# Patient Record
Sex: Female | Born: 1965
Health system: Southern US, Community
[De-identification: ages and names within clinical notes are randomized; demographics above are authoritative.]

## PROBLEM LIST (undated history)

## (undated) DIAGNOSIS — E559 Vitamin D deficiency, unspecified: Secondary | ICD-10-CM

## (undated) DIAGNOSIS — Z803 Family history of malignant neoplasm of breast: Secondary | ICD-10-CM

## (undated) DIAGNOSIS — F3289 Other specified depressive episodes: Secondary | ICD-10-CM

## (undated) DIAGNOSIS — F411 Generalized anxiety disorder: Secondary | ICD-10-CM

## (undated) DIAGNOSIS — F329 Major depressive disorder, single episode, unspecified: Secondary | ICD-10-CM

## (undated) DIAGNOSIS — D219 Benign neoplasm of connective and other soft tissue, unspecified: Secondary | ICD-10-CM

## (undated) DIAGNOSIS — N926 Irregular menstruation, unspecified: Secondary | ICD-10-CM

## (undated) DIAGNOSIS — K589 Irritable bowel syndrome without diarrhea: Secondary | ICD-10-CM

## (undated) DIAGNOSIS — I10 Essential (primary) hypertension: Secondary | ICD-10-CM

## (undated) HISTORY — DX: Irregular menstruation, unspecified: N92.6

## (undated) HISTORY — PX: OTHER SURGICAL HISTORY: SHX169

## (undated) HISTORY — PX: DILATION AND CURETTAGE OF UTERUS: SHX78

## (undated) HISTORY — DX: Benign neoplasm of connective and other soft tissue, unspecified: D21.9

## (undated) HISTORY — DX: Vitamin D deficiency, unspecified: E55.9

## (undated) HISTORY — DX: Essential (primary) hypertension: I10

## (undated) HISTORY — DX: Generalized anxiety disorder: F41.1

## (undated) HISTORY — DX: Other specified depressive episodes: F32.89

## (undated) HISTORY — DX: Family history of malignant neoplasm of breast: Z80.3

## (undated) HISTORY — DX: Irritable bowel syndrome, unspecified: K58.9

## (undated) HISTORY — DX: Major depressive disorder, single episode, unspecified: F32.9

---

## 1996-02-13 HISTORY — PX: WISDOM TOOTH EXTRACTION: SHX21

## 1997-05-26 ENCOUNTER — Other Ambulatory Visit: Admission: RE | Admit: 1997-05-26 | Discharge: 1997-05-26 | Payer: Self-pay | Admitting: *Deleted

## 1999-04-05 ENCOUNTER — Other Ambulatory Visit: Admission: RE | Admit: 1999-04-05 | Discharge: 1999-04-05 | Payer: Self-pay | Admitting: *Deleted

## 1999-08-16 ENCOUNTER — Emergency Department (HOSPITAL_COMMUNITY): Admission: EM | Admit: 1999-08-16 | Discharge: 1999-08-16 | Payer: Self-pay | Admitting: Emergency Medicine

## 2004-04-10 ENCOUNTER — Other Ambulatory Visit: Admission: RE | Admit: 2004-04-10 | Discharge: 2004-04-10 | Payer: Self-pay | Admitting: *Deleted

## 2004-04-19 ENCOUNTER — Ambulatory Visit (HOSPITAL_COMMUNITY): Admission: RE | Admit: 2004-04-19 | Discharge: 2004-04-19 | Payer: Self-pay | Admitting: *Deleted

## 2004-04-27 ENCOUNTER — Encounter: Admission: RE | Admit: 2004-04-27 | Discharge: 2004-04-27 | Payer: Self-pay | Admitting: *Deleted

## 2005-02-12 DIAGNOSIS — N926 Irregular menstruation, unspecified: Secondary | ICD-10-CM

## 2005-02-12 HISTORY — DX: Irregular menstruation, unspecified: N92.6

## 2006-01-08 ENCOUNTER — Emergency Department: Payer: Self-pay | Admitting: Emergency Medicine

## 2006-08-06 ENCOUNTER — Emergency Department: Payer: Self-pay | Admitting: Emergency Medicine

## 2008-08-12 HISTORY — PX: ABDOMINAL HYSTERECTOMY: SHX81

## 2008-08-25 ENCOUNTER — Ambulatory Visit: Payer: Self-pay | Admitting: Obstetrics & Gynecology

## 2008-08-31 ENCOUNTER — Ambulatory Visit: Payer: Self-pay | Admitting: Obstetrics & Gynecology

## 2008-09-04 ENCOUNTER — Emergency Department: Payer: Self-pay | Admitting: Emergency Medicine

## 2009-04-26 ENCOUNTER — Encounter: Payer: Self-pay | Admitting: Family Medicine

## 2009-05-20 ENCOUNTER — Encounter (INDEPENDENT_AMBULATORY_CARE_PROVIDER_SITE_OTHER): Payer: Self-pay | Admitting: *Deleted

## 2009-06-15 DIAGNOSIS — D649 Anemia, unspecified: Secondary | ICD-10-CM | POA: Insufficient documentation

## 2009-06-15 DIAGNOSIS — G43909 Migraine, unspecified, not intractable, without status migrainosus: Secondary | ICD-10-CM | POA: Insufficient documentation

## 2009-06-15 HISTORY — DX: Anemia, unspecified: D64.9

## 2009-06-17 ENCOUNTER — Ambulatory Visit: Payer: Self-pay | Admitting: Gastroenterology

## 2009-06-17 ENCOUNTER — Encounter (INDEPENDENT_AMBULATORY_CARE_PROVIDER_SITE_OTHER): Payer: Self-pay | Admitting: *Deleted

## 2009-06-17 DIAGNOSIS — K5900 Constipation, unspecified: Secondary | ICD-10-CM | POA: Insufficient documentation

## 2009-06-17 DIAGNOSIS — F411 Generalized anxiety disorder: Secondary | ICD-10-CM | POA: Insufficient documentation

## 2009-06-17 DIAGNOSIS — F419 Anxiety disorder, unspecified: Secondary | ICD-10-CM | POA: Insufficient documentation

## 2009-06-17 DIAGNOSIS — R519 Headache, unspecified: Secondary | ICD-10-CM | POA: Insufficient documentation

## 2009-06-17 DIAGNOSIS — K589 Irritable bowel syndrome without diarrhea: Secondary | ICD-10-CM | POA: Insufficient documentation

## 2009-06-17 DIAGNOSIS — R51 Headache: Secondary | ICD-10-CM | POA: Insufficient documentation

## 2009-06-17 DIAGNOSIS — F339 Major depressive disorder, recurrent, unspecified: Secondary | ICD-10-CM | POA: Insufficient documentation

## 2009-06-17 DIAGNOSIS — I10 Essential (primary) hypertension: Secondary | ICD-10-CM | POA: Insufficient documentation

## 2009-06-17 LAB — CONVERTED CEMR LAB
Sed Rate: 10 mm/hr (ref 0–22)
T4, Total: 6.1 ug/dL (ref 5.0–12.5)
Vitamin B-12: 338 pg/mL (ref 211–911)

## 2009-06-24 ENCOUNTER — Ambulatory Visit: Payer: Self-pay | Admitting: Gastroenterology

## 2009-07-13 ENCOUNTER — Ambulatory Visit: Payer: Self-pay | Admitting: Family Medicine

## 2009-07-13 DIAGNOSIS — H6120 Impacted cerumen, unspecified ear: Secondary | ICD-10-CM | POA: Insufficient documentation

## 2009-07-13 DIAGNOSIS — Z8659 Personal history of other mental and behavioral disorders: Secondary | ICD-10-CM | POA: Insufficient documentation

## 2009-07-20 ENCOUNTER — Telehealth (INDEPENDENT_AMBULATORY_CARE_PROVIDER_SITE_OTHER): Payer: Self-pay | Admitting: *Deleted

## 2010-03-14 NOTE — Letter (Signed)
Summary: [facility] Instructions  [facility]  [address] [place], [place] [address]   Phone: [phone]  Fax: [phone]       Kaitlyn Solomon    11/04/1965    MRN: [id number]        Procedure Day /Date: Friday, 06/24/09     Arrival Time: 2:30      Procedure Time: 3:30     Location of Procedure:                    Juliann Pares  [facility] (4th Floor)                        PREPARATION FOR COLONOSCOPY WITH MOVIPREP   Starting 5 days prior to your procedure 06/19/09 do not eat nuts, seeds, popcorn, corn, beans, peas,  salads, or any raw vegetables.  Do not take any fiber supplements (e.g. Metamucil, Citrucel, and Benefiber).  THE DAY BEFORE YOUR PROCEDURE         DATE: 06/23/09   DAY: Thursday  1.  Drink clear liquids the entire day-NO SOLID FOOD  2.  Do not drink anything colored red or purple.  Avoid juices with pulp.  No orange juice.  3.  Drink at least 64 oz. (8 glasses) of fluid/clear liquids during the day to prevent dehydration and help the prep work efficiently.  CLEAR LIQUIDS INCLUDE: Water Jello Ice Popsicles Tea (sugar ok, no milk/cream) Powdered fruit flavored drinks Coffee (sugar ok, no milk/cream) Gatorade Juice: apple, white grape, white cranberry  Lemonade Clear bullion, consomm, broth Carbonated beverages (any kind) Strained chicken noodle soup Hard Candy                             4.  In the morning, mix first dose of MoviPrep solution:    Empty 1 Pouch A and 1 Pouch B into the disposable container    Add lukewarm drinking water to the top line of the container. Mix to dissolve    Refrigerate (mixed solution should be used within 24 hrs)  5.  Begin drinking the prep at 5:00 p.m. The MoviPrep container is divided by 4 marks.   Every 15 minutes drink the solution down to the next mark (approximately 8 oz) until the full liter is complete.   6.  Follow completed prep with 16 oz of clear liquid of your choice (Nothing red  or purple).  Continue to drink clear liquids until bedtime.  7.  Before going to bed, mix second dose of MoviPrep solution:    Empty 1 Pouch A and 1 Pouch B into the disposable container    Add lukewarm drinking water to the top line of the container. Mix to dissolve    Refrigerate  THE DAY OF YOUR PROCEDURE      DATE: 06/24/09   DAY: Friday  Beginning at 10:30a.m. (5 hours before procedure):         1. Every 15 minutes, drink the solution down to the next mark (approx 8 oz) until the full liter is complete.  2. Follow completed prep with 16 oz. of clear liquid of your choice.    3. You may drink clear liquids until 1:30 (2 HOURS BEFORE PROCEDURE).   MEDICATION INSTRUCTIONS  Unless otherwise instructed, you should take regular prescription medications with a small sip of water   as early as possible the morning of  your procedure.  .                  OTHER INSTRUCTIONS  You will need a responsible adult at least 47 years of age to accompany you and drive you home.   This person must remain in the waiting room during your procedure.  Wear loose fitting clothing that is easily removed.  Leave jewelry and other valuables at home.  However, you may wish to bring a book to read or  an iPod/MP3 player to listen to music as you wait for your procedure to start.  Remove all body piercing jewelry and leave at home.  Total time from sign-in until discharge is approximately 2-3 hours.  You should go home directly after your procedure and rest.  You can resume normal activities the  day after your procedure.  The day of your procedure you should not:   Drive   Make legal decisions   Operate machinery   Drink alcohol   Return to work  You will receive specific instructions about eating, activities and medications before you leave.    The above instructions have been reviewed and explained to me by   _______________________    I fully understand and can  verbalize these instructions _____________________________ Date _________

## 2010-03-14 NOTE — Procedures (Signed)
Summary: Colonoscopy  Patient: Kaitlyn Solomon Note: All result statuses are Final unless otherwise noted.  Tests: (1) Colonoscopy (COL)   COL Colonoscopy           DONE     [facility]     [address]     [place], [place]  [address]           COLONOSCOPY PROCEDURE REPORT           PATIENT:  [name], [name]  MR#:  [id number]     BIRTHDATE:  08/26/1965, 43 yrs. old  GENDER:  female     ENDOSCOPIST:  Vania Rea. Jarold Motto, MD, [facility]     REF. BY:     PROCEDURE DATE:  06/24/2009     PROCEDURE:  Average-risk screening colonoscopy     G0121     ASA CLASS:  Class II     INDICATIONS:  FOBT positive stool, Routine Risk Screening     MEDICATIONS:   Fentanyl 100 mcg IV, Versed 10 mg IV           DESCRIPTION OF PROCEDURE:   After the risks benefits and     alternatives of the procedure were thoroughly explained, informed     consent was obtained.  Digital rectal exam was performed and     revealed no abnormalities.   The LB [id number] endoscope     was introduced through the anus and advanced to the cecum, which     was identified by both the appendix and ileocecal valve, without     limitations.  The quality of the prep was excellent, using     MoviPrep.  The instrument was then slowly withdrawn as the colon     was fully examined.     <<PROCEDUREIMAGES>>           FINDINGS:  Severe diverticulosis was found throughout the colon.     No polyps or cancers were seen.  no active bleeding or blood in c.     Retroflexed views in the rectum revealed no abnormalities.    The     scope was then withdrawn from the patient and the procedure     completed.           COMPLICATIONS:  None     ENDOSCOPIC IMPRESSION:     1) Severe diverticulosis throughout the colon     2) No polyps or cancers     3) No active bleeding or blood in c     1.CHRONIC FUNCTIONAL CONSTIPATION.SEVERE PANCOLONIC     DIVERTICULOSIS SUGGESTS UNDERLYING MOTILITY DISORDER.     2.FALSE ++ STOOL GUAIAC.  RECOMMENDATIONS:     1) Continue current colorectal screening recommendations for     "routine risk" patients with a repeat colonoscopy in 10 years.     2) Continue current medications     3) Titrate to need     REPEAT EXAM:  No           ______________________________     Vania Rea. Jarold Motto, MD, [facility] Graham           CC:  Harold Hedge, MD           n.     Rosalie Doctor:   Vania Rea. Shivank Pinedo at 06/24/2009 03:19 PM           Ahmed Prima, [id number]  Note: An exclamation mark (!) indicates a result that was not dispersed into the flowsheet. Document Creation Date: 06/24/2009 3:20  PM _______________________________________________________________________  (1) Order result status: Final Collection or observation date-time: 06/24/2009 15:11 Requested date-time:  Receipt date-time:  Reported date-time:  Referring Physician:   Ordering Physician: Sheryn Bison [phone]) Specimen Source:  Source: Launa Grill Order Number: [id number] Lab site:   Appended Document: Colonoscopy    Clinical Lists Changes  Observations: Added new observation of COLONNXTDUE: 06/2019 (06/24/2009 16:03)

## 2010-03-14 NOTE — Assessment & Plan Note (Signed)
Summary: NEW PT TO EST/CLE   Vital Signs:  Patient profile:   45 year old female Height:      62 inches Weight:      151.38 pounds BMI:     27.79 Temp:     98.3 degrees F oral Pulse rate:   68 / minute Pulse rhythm:   regular BP sitting:   122 / 80  (left arm) Cuff size:   regular  Vitals Entered By: Linde Gillis CMA Duncan Dull) (July 13, 2009 9:47 AM) CC: new patient   History of Present Illness: 45 yo here to establish care.  1.  Depression/bipolar disorder- diagnosed in 2004 after she had a manic episode.  Was on Effexor, Lamictal, xanax, doses unknown.  Helped greatly for a few years but her husband did not want her on medications.  Now is separated from husband and feels depression is getting much worse.  Tearful all the time, anhedonia, insomina, decreased appetite.  Denies any manic symptoms that she is aware of other than going several nights without sleeping.  No SI or HI.  Under a lot of stress- going to CNA school full time, works at night.  Has a 3 yo daughter.  2.  Cannot hear out of right ear for past several weeks.  No trauma.  No ear pain or congestion.  3.  HTN- OBGYN has prescrived Enalapril/HCTZ 25/12/5 mg daily for years.  No side effects, doing well.  No HA, blurred vision, CP or SOB.  No LE edema.  4.  Diverticulosis- followed by GI.  Colonoscopy last month (see report).  Takes Miralax nightly.  5.  Well woman- UTD on prevention.  Per pt, had fasting labs a few months ago at Dr. Phineas Real office.  s/p hysterectomy for menorrahgia in 08/2008.    Allergies: 1)  ! 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  Alcohol Use - no Daily Caffeine Use Illicit Drug Use - no  Risk Factors: Smoking Status: quit (06/17/2009)  Past Medical History: Current Problems:  DEPRESSION (ICD-311) IRRITABLE BOWEL SYNDROME (ICD-564.1) HYPERTENSION (ICD-401.9) HEADACHE, CHRONIC (ICD-784.0) ANXIETY (ICD-300.00) MIGRAINE HEADACHE (ICD-346.90) ANEMIA (ICD-285.9) bipolar disorder.  Past Surgical History: Hysterectomy 08/2008 D& C surgeries to remove fibriod tumors  Family History: Reviewed history from 06/17/2009 and no changes required. Family History of Breast Cancer: mother (died at 70),  MGM, maternal aunt Family History of Diabetes: MGM, Maternal uncle, younger brother fathers side of the family history is unknown by the patient but he commited suicide at 63 No FH of Colon Cancer:  Social History: Reviewed history from 06/17/2009 and no changes required. 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  Alcohol Use - no Daily Caffeine Use Illicit Drug Use - no  Review of Systems      See HPI General:  Complains of fatigue; denies chills and malaise. Eyes:  Denies blurring. ENT:  Complains of decreased hearing; denies difficulty swallowing, ear discharge, earache, hoarseness, nasal congestion, nosebleeds, postnasal drainage, and ringing in ears. CV:  Denies difficulty breathing at night. Resp:  Denies shortness of breath. GI:  Denies abdominal pain, bloody stools, and change  in bowel habits. GU:  Denies abnormal vaginal bleeding. MS:  Denies joint pain, joint redness, and joint swelling. Derm:  Denies rash. Neuro:  Denies headaches. Psych:  Complains of anxiety, depression, easily angered, easily tearful, and irritability; denies mental problems, panic attacks, sense of great danger, suicidal thoughts/plans, thoughts of violence, unusual visions or sounds, and thoughts /plans of harming others. Endo:  Denies cold intolerance and heat  intolerance. Heme:  Denies abnormal bruising and bleeding. Allergy:  Denies seasonal allergies.  Physical Exam  General:  Well developed, well nourished, no acute distress.healthy appearing.   Ears:  right ear: impacted cerumen- irrigated using water, currette to remove cerumen left ear normal Mouth:  Oral mucosa and oropharynx without lesions or exudates.  Teeth in good repair. Psych:  Oriented X3, memory intact for recent and remote, good eye contact, and depressed affect., tearful.     Impression & Recommendations:  Problem # 1:  DEPRESSION (ICD-311) Assessment Deteriorated Time spent with patient 45 minutes, more than 50% of this time was spent counseling patient on depression/bipolar disorder.  Agreed to restart Effexor, but hold off on Lamictal.  Refer to psychiatry for further management.  Prescribe Alprazolam (short term only) until Effexor starts to take affect.  Will likely need to titrate up dose.  Follow up with me in one month. Her updated medication list for this problem includes:    Alprazolam 0.25 Mg Tabs (Alprazolam) .Marland Kitchen... 1 tab by mouth three times a day as needed anxiety    Effexor Xr 75 Mg Cp24 (Venlafaxine hcl) ..... Once daily  Orders: Psychiatric Referral (Psych)  Problem # 2:  CERUMEN IMPACTION, RIGHT (ICD-380.4) Assessment: New  Removed Cerumen with water and currette.  Hearing vastly improved once removed.  Orders: Cerumen Impaction Removal (27253)  Problem # 3:  HYPERTENSION (ICD-401.9) Assessment: Unchanged Stable.  Refilled current meds.   Her updated medication list for this problem includes:    Vaseretic 10-25 Mg Tabs (Enalapril-hydrochlorothiazide) .Marland Kitchen... Take 1  tablet by mouth daily  Complete Medication List: 1)  Multivitamins Caps (Multiple vitamin) .... Take one by mouth once daily 2)  Vaseretic 10-25 Mg Tabs (Enalapril-hydrochlorothiazide) .... Take 1  tablet by mouth daily 3)  Excedrin Migraine 250-250-65 Mg Tabs  (Aspirin-acetaminophen-caffeine) .... As needed 4)  Ferrous Sulfate 325 (65 Fe) Mg Tabs (Ferrous sulfate) .... Take one by mouth once daily 5)  Miralax Powd (Polyethylene glycol 3350) .... Take one scoop at bedtime 6)  Alprazolam 0.25 Mg Tabs (Alprazolam) .Marland Kitchen.. 1 tab by mouth three times a day as needed anxiety 7)  Effexor Xr 75 Mg Cp24 (Venlafaxine hcl) .... Once daily  Patient Instructions: 1)  It was nice to meet you. 2)  Please stop by to see Aram Beecham on your way out to set up your psychiatry referral. 3)  Please ask Dr. Phineas Real office to send your labs results to [place]. 4)  Make an appointment to come see me in 1 month. Prescriptions: EFFEXOR XR 75 MG CP24 (VENLAFAXINE HCL) once daily  #90 x 3   Entered and Authorized by:   Ruthe Mannan MD   Signed by:   Ruthe Mannan MD on 07/13/2009   Method used:   Electronically to        [facility] [address] (retail)       [address]       [place], [place]  [address]       Ph: [phone]  Fax: [phone]   RxID:   [id number] ALPRAZOLAM 0.25 MG TABS (ALPRAZOLAM) 1 tab by mouth three times a day as needed anxiety  #60 x 0   Entered and Authorized by:   Ruthe Mannan MD   Signed by:   Ruthe Mannan MD on 07/13/2009   Method used:   Print then Give to Patient   RxID:   [id number] VASERETIC 10-25 MG TABS (ENALAPRIL-HYDROCHLOROTHIAZIDE) Take 1  tablet by mouth daily  #90 x 6   Entered and Authorized by:   Ruthe Mannan MD   Signed by:   Ruthe Mannan MD on 07/13/2009   Method used:   Electronically to        [facility] [address] (retail)       [address]       [place], [place]  [address]       Ph: [phone]       Fax: [phone]   RxID:   [id number]   Current Allergies (reviewed today): ! CODEINE  TD Result Date:  07/26/2004 TD Result:  historical PAP Result Date:  07/01/2009 PAP Result:  historical PAP Next Due:  Not  Indicated Mammogram Result Date:  07/12/2009 Mammogram Result:  historical Mammogram Next Due:  1 yr    Past Medical History:    Current Problems:     DEPRESSION (ICD-311)    IRRITABLE BOWEL SYNDROME (ICD-564.1)    HYPERTENSION (ICD-401.9)    HEADACHE, CHRONIC (ICD-784.0)    ANXIETY (ICD-300.00)    MIGRAINE HEADACHE (ICD-346.90)    ANEMIA (ICD-285.9)    bipolar disorder.      Past Surgical History:    Hysterectomy 08/2008    D& C    surgeries to remove fibriod tumors

## 2010-03-14 NOTE — Letter (Signed)
Summary: New Patient letter  [facility]  [address] [place], [place] [address]   Phone: [phone]  Fax: [phone]       05/20/2009 MRN: [id number]  [facility] [address] [place], [place]  [address]  Dear Kaitlyn Solomon,  Welcome to the Gastroenterology Division at [facility].    You are scheduled to see Dr. Jarold Motto on 06-17-09 at 9:30a.m. on the 3rd floor at [facility], [address]  We ask that you try to arrive at our office 15 minutes prior to your appointment time to allow for check-in.  We would like you to complete the enclosed self-administered evaluation form prior to your visit and bring it with you on the day of your appointment.  We will review it with you.  Also, please bring a complete list of all your medications or, if you prefer, bring the medication bottles and we will list them.  Please bring your insurance card so that we may make a copy of it.  If your insurance requires a referral to see a specialist, please bring your referral form from your primary care physician.  Co-payments are due at the time of your visit and may be paid by cash, check or credit card.     Your office visit will consist of a consult with your physician (includes a physical exam), any laboratory testing he/she may order, scheduling of any necessary diagnostic testing (e.g. x-ray, ultrasound, CT-scan), and scheduling of a procedure (e.g. Endoscopy, Colonoscopy) if required.  Please allow enough time on your schedule to allow for any/all of these possibilities.    If you cannot keep your appointment, please call [phone] to cancel or reschedule prior to your appointment date.  This allows [place] the opportunity to schedule an appointment for another patient in need of care.  If you do not cancel or reschedule by 5 p.m. the business day prior to your appointment date, you will be charged a $50.00 late cancellation/no-show fee.    Thank you for choosing  [facility] for your medical needs.  We appreciate the opportunity to care for you.  Please visit [place] at our website  to learn more about our practice.                     Sincerely,                                                             The Gastroenterology Division

## 2010-03-14 NOTE — Progress Notes (Signed)
Summary: Referral  Pending...  Phone Note Other Incoming   Summary of Call: Pt called says she is still trying to schedule appt w/ Psyc...she will call me back.Daine Gip  July 22, 2009 12:47 PM  Initial call taken by: Daine Gip,  July 22, 2009 12:47 PM  Follow-up for Phone Call        Pt returned call on 08-02-2009- her appt w/ the Psyc is for Thursday, June 23,2011 at [facility].Daine Gip  August 03, 2009 4:00 PM  Follow-up by: Daine Gip,  August 03, 2009 4:00 PM

## 2010-03-14 NOTE — Assessment & Plan Note (Signed)
Summary: constipation//painful BM--ch.   History of Present Illness Visit Type: Initial Consult Primary GI MD: Sheryn Bison MD FACP FAGA Primary Provider: Kallie Locks, MD Requesting Provider: Harold Hedge, MD Chief Complaint: Patient c/o chronic constipation she only has a BM when she has her menstral  cycle or she knows she can eat certian foods to make herself have a BM. She also complains of lower abdominal pain which gets more severe when she has to have a BM. Patient states that she had positive hemocults at her GYN. History of Present Illness:   45 year old F. American female referred through the courtesy of Dr. Huntley Dec for evaluation of worsening constipation and guaiac positive stools.  This pleasant patient has had chronic constipation since childhood with enema and laxative use most of her life. She allegedly previously would only have bowel movements with her menstrual cycle, and underwent a partial hysterectomy this past fall because of fibroid tumors and menorrhagia. Her constipation has been worse since her surgery. She did use MiraLax with good results but discontinued this medication. She did have some abdominal cramping when constipated with gas and bloating. She herself denies melena or hematochezia. She's had chronic anemia and is on iron therapy. She takes daily salicylates for frequent headaches, but denies dyspepsia or reflux symptoms. She has never had endoscopy or colonoscopy.  She denies any specific food intolerances, and does not use sorbitol or fructose. Her appetite is good and her weight is stable. She allegedly uses liberal p.o. fluids. She does have some ice craving. She denies use of starch daily. Family history is noncontributory. Apparently recent CBC and metabolic profile was normal.   GI Review of Systems    Reports abdominal pain.     Location of  Abdominal pain: lower abdomen.    Denies acid reflux, belching, bloating, chest pain, dysphagia with liquids,  dysphagia with solids, heartburn, loss of appetite, nausea, vomiting, vomiting blood, weight loss, and  weight gain.      Reports constipation and  heme positive stool.     Denies anal fissure, black tarry stools, change in bowel habit, diarrhea, diverticulosis, fecal incontinence, hemorrhoids, irritable bowel syndrome, jaundice, light color stool, liver problems, rectal bleeding, and  rectal pain. Preventive Screening-Counseling & Management  Alcohol-Tobacco     Smoking Status: quit      Drug Use:  no.      Current Medications (verified): 1)  Multivitamins  Caps (Multiple Vitamin) .... Take One By Mouth Once Daily 2)  Vaseretic 10-25 Mg Tabs (Enalapril-Hydrochlorothiazide) .... Take 1  Tablet By Mouth Daily 3)  Excedrin Migraine 250-250-65 Mg Tabs (Aspirin-Acetaminophen-Caffeine) .... As Needed 4)  Ferrous Sulfate 325 (65 Fe) Mg Tabs (Ferrous Sulfate) .... Take One By Mouth Once Daily  Allergies (verified): 1)  ! Codeine  Past History:  Past medical, surgical, family and social histories (including risk factors) reviewed for relevance to current acute and chronic problems.  Past Medical History: Current Problems:  DEPRESSION (ICD-311) IRRITABLE BOWEL SYNDROME (ICD-564.1) HYPERTENSION (ICD-401.9) HEADACHE, CHRONIC (ICD-784.0) ANXIETY (ICD-300.00) MIGRAINE HEADACHE (ICD-346.90) ANEMIA (ICD-285.9)  Past Surgical History: Hysterectomy D& C surgeries to remove fibriod tumors  Family History: Reviewed history and no changes required. Family History of Breast Cancer: mother,  MGM, maternal aunt Family History of Diabetes: MGM, Maternal uncle, younger brother fathers side of the family history is unknown by the patient  No FH of Colon Cancer:  Social History: Reviewed history and no changes required. 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  Alcohol Use - no Daily Caffeine Use Illicit Drug Use - no Smoking Status:  quit Drug Use:  no  Review of  Systems       The patient complains of anxiety-new, back pain, confusion, depression-new, fatigue, headaches-new, heart rhythm changes, night sweats, sleeping problems, thirst - excessive, urination - excessive, urination changes/pain, and urine leakage.  The patient denies allergy/sinus, anemia, arthritis/joint pain, blood in urine, breast changes/lumps, change in vision, cough, coughing up blood, fainting, fever, hearing problems, heart murmur, itching, menstrual pain, muscle pains/cramps, nosebleeds, pregnancy symptoms, shortness of breath, skin rash, sore throat, swelling of feet/legs, swollen lymph glands, thirst - excessive , urination - excessive , vision changes, and voice change.    Vital Signs:  Patient profile:   45 year old female Height:      62 inches Weight:      151.6 pounds BMI:     27.83 Pulse rate:   76 / minute Pulse rhythm:   regular BP sitting:   122 / 82  (right arm) Cuff size:   regular  Vitals Entered By: Harlow Mares CMA Duncan Dull) (Jun 17, 2009 9:49 AM)  Physical Exam  General:  Well developed, well nourished, no acute distress.healthy appearing.   Head:  Normocephalic and atraumatic. Eyes:  PERRLA, no icterus.exam deferred to patient's ophthalmologist.   Neck:  Supple; no masses or thyromegaly. Lungs:  Clear throughout to auscultation. Heart:  Regular rate and rhythm; no murmurs, rubs,  or bruits. Abdomen:  Soft, nontender and nondistended. No masses, hepatosplenomegaly or hernias noted. Normal bowel sounds. Rectal:  deferred until time of colonoscopy.   Msk:  Symmetrical with no gross deformities. Normal posture.Multiple tattoos present. Pulses:  Normal pulses noted. Extremities:  No clubbing, cyanosis, edema or deformities noted. Neurologic:  Alert and  oriented x4;  grossly normal neurologically. Cervical Nodes:  No significant cervical adenopathy. Psych:  Alert and cooperative. Normal mood and affect.   Impression & Recommendations:  Problem # 1:   CONSTIPATION (ICD-564.00) Assessment Deteriorated Probable chronic colonic atony---guaiac positive stools probably a false positive from salicylate use---rule out colon polyps. Colonoscopy has been scheduled her convenience. We will continue high-fiber diet as tolerated with liberal p.o. fluids and resume MiraLax 8 ounces at bedtime. I have ordered anemia profile, TSH, and sed rate. Information concerning constipation his management has also been supplied to the patient  Problem # 2:  IRRITABLE BOWEL SYNDROME (ICD-564.1) Assessment: Deteriorated Constipation Predominant IBS. There is nothing in her history to suggest underlying inflammatory bowel disease.  Problem # 3:  HYPERTENSION (ICD-401.9) Assessment: Improved blood pressure today is normal at 122/82. She is to continue her antihypertensives as prescribed.  Patient Instructions: 1)  Copy sent to : Dr. Hulan Fess. 2)  Please continue current medications.  3)  Colonoscopy and Flexible Sigmoidoscopy brochure given.  4)  Conscious Sedation brochure given.  5)  [facility] fiber and probiotic tablets twice a day 6)  MiraLax 8 ounces q.h.s. 7)  High-fiber diet and liberal p.o. fluids as tolerated labs pending  Appended Document: Orders Update   Patient Instructions: 1)  Please go to the basement for your labs today. 2)  Come to the 4th floor of the [facility] arrive at 2:30pm for you colonoscopy on 06/24/2009. 3)  Take Miralax overnight you can buy this over the counter. 4)  [facility] Patient Information Guide given to patient.  5)  Colonoscopy and Flexible Sigmoidoscopy brochure given.  6)  Copy sent to : Taila  Dayton Martes, MD 7)  The medication list was reviewed and reconciled.  All changed / newly prescribed medications were explained.  A complete medication list was provided to the patient / caregiver.   Clinical Lists Changes  Medications: Added new medication of MOVIPREP 100 GM  SOLR  (PEG-KCL-NACL-NASULF-NA ASC-C) As per prep instructions. - Signed Added new medication of MIRALAX  POWD (POLYETHYLENE GLYCOL 3350) take one scoop at bedtime Rx of MOVIPREP 100 GM  SOLR (PEG-KCL-NACL-NASULF-NA ASC-C) As per prep instructions.;  #1 x 0;  Signed;  Entered by: Harlow Mares CMA (AAMA);  Authorized by: Mardella Layman MD [facility];  Method used: Electronically to [facility] [address], [place], [place], [place]  [address], Ph: [phone], Fax: [phone] Orders: Added new Test order of TLB-B12, Serum-Total ONLY [phone]) - Signed Added new Test order of TLB-Ferritin (82728-FER) - Signed Added new Test order of TLB-Folic Acid (Folate) (82746-FOL) - Signed Added new Test order of TLB-IBC Pnl (Iron/FE;Transferrin) (83550-IBC) - Signed Added new Test order of TLB-Sedimentation Rate (ESR) (85652-ESR) - Signed Added new Test order of TLB-T4 (Thyrox), Total [phone]) - Signed Added new Test order of Colonoscopy (Colon) - Signed Observations: Added new observation of PI GASTRO: Please go to the basement for your labs today. Come to the 4th floor of the [place] [facility] arrive at 2:30pm for you colonoscopy on 06/24/2009. Take Miralax overnight you can buy this over the counter.  [facility] Patient Information Guide given to patient.  Colonoscopy and Flexible Sigmoidoscopy brochure given.  Copy sent to : Kallie Locks, MD The medication list was reviewed and reconciled.  All changed / newly prescribed medications were explained.  A complete medication list was provided to the patient / caregiver. (06/17/2009 10:15)    Prescriptions: MOVIPREP 100 GM  SOLR (PEG-KCL-NACL-NASULF-NA ASC-C) As per prep instructions.  #1 x 0   Entered by:   Harlow Mares CMA (AAMA)   Authorized by:   Mardella Layman MD [facility]   Signed by:   Harlow Mares CMA (AAMA) on 06/17/2009   Method used:   Electronically to        [facility] [address] (retail)        [address], [address]       [place], [place]  [address]       Ph: [phone]       Fax: [phone]   RxID:   [id number]

## 2010-03-14 NOTE — Progress Notes (Signed)
----   Converted from flag ---- ---- 07/19/2009 4:39 PM, Daine Gip wrote: Just an FYI... Pt called, she misplaced the telephone # given for the Psychiatric referral. I gave them to her again, requested that she call me back w/ the appt. Because of pts ins, the physician we would normally refer to, does not accept her ins..cdavis ------------------------------

## 2010-04-06 ENCOUNTER — Emergency Department: Payer: Self-pay | Admitting: Emergency Medicine

## 2010-04-21 ENCOUNTER — Other Ambulatory Visit: Payer: Self-pay | Admitting: Family Medicine

## 2010-04-21 ENCOUNTER — Ambulatory Visit (INDEPENDENT_AMBULATORY_CARE_PROVIDER_SITE_OTHER): Payer: Managed Care, Other (non HMO) | Admitting: Family Medicine

## 2010-04-21 ENCOUNTER — Encounter: Payer: Self-pay | Admitting: Family Medicine

## 2010-04-21 ENCOUNTER — Ambulatory Visit (INDEPENDENT_AMBULATORY_CARE_PROVIDER_SITE_OTHER)
Admission: RE | Admit: 2010-04-21 | Discharge: 2010-04-21 | Disposition: A | Discharge status: Home / Self Care | Payer: Managed Care, Other (non HMO) | Source: Ambulatory Visit | Attending: Family Medicine | Admitting: Family Medicine

## 2010-04-21 DIAGNOSIS — R079 Chest pain, unspecified: Secondary | ICD-10-CM | POA: Insufficient documentation

## 2010-04-21 DIAGNOSIS — R5383 Other fatigue: Secondary | ICD-10-CM | POA: Insufficient documentation

## 2010-04-21 DIAGNOSIS — R5381 Other malaise: Secondary | ICD-10-CM

## 2010-04-21 DIAGNOSIS — R42 Dizziness and giddiness: Secondary | ICD-10-CM

## 2010-04-21 LAB — TSH: TSH: 7.703 u[IU]/mL — ABNORMAL HIGH (ref 0.350–4.500)

## 2010-04-22 LAB — COMPLETE METABOLIC PANEL WITH GFR
AST: 19 U/L (ref 0–37)
Albumin: 4.5 g/dL (ref 3.5–5.2)
Alkaline Phosphatase: 59 U/L (ref 39–117)
BUN: 18 mg/dL (ref 6–23)
GFR, Est Non African American: >60 mL/min (ref >60)
Potassium: 4.1 mEq/L (ref 3.5–5.3)
Sodium: 140 mEq/L (ref 135–145)
Total Bilirubin: 0.2 mg/dL — ABNORMAL LOW (ref 0.3–1.2)

## 2010-04-22 LAB — CBC WITH DIFFERENTIAL/PLATELET
Eosinophils Absolute: 0.1 10*3/uL (ref 0.0–0.7)
Eosinophils Relative: 2 % (ref 0–5)
Hemoglobin: 11.3 g/dL — ABNORMAL LOW (ref 12.0–15.0)
Lymphs Abs: 2.9 10*3/uL (ref 0.7–4.0)
MCH: 21.6 pg — ABNORMAL LOW (ref 26.0–34.0)
MCV: 69.2 fL — ABNORMAL LOW (ref 78.0–100.0)
Monocytes Absolute: 0.6 10*3/uL (ref 0.1–1.0)
Monocytes Relative: 9 % (ref 3–12)

## 2010-04-24 LAB — CONVERTED CEMR LAB
ALT: 24 units/L (ref 0–35)
CO2: 27 meq/L (ref 19–32)
Calcium: 9.1 mg/dL (ref 8.4–10.5)
Chloride: 103 meq/L (ref 96–112)
Eosinophils Absolute: 0.1 10*3/uL (ref 0.0–0.7)
Eosinophils Relative: 2 % (ref 0–5)
Glucose, Bld: 74 mg/dL (ref 70–99)
Lymphs Abs: 2.9 10*3/uL (ref 0.7–4.0)
MCV: 69.2 fL — ABNORMAL LOW (ref 78.0–100.0)
Monocytes Relative: 9 % (ref 3–12)
Neutrophils Relative %: 47 % (ref 43–77)
Platelets: 278 10*3/uL (ref 150–400)
RBC: 5.23 M/uL — ABNORMAL HIGH (ref 3.87–5.11)
Sodium: 140 meq/L (ref 135–145)
Total Bilirubin: 0.2 mg/dL — ABNORMAL LOW (ref 0.3–1.2)
Total Protein: 6.5 g/dL (ref 6.0–8.3)
WBC: 6.7 10*3/uL (ref 4.0–10.5)

## 2010-04-25 NOTE — Assessment & Plan Note (Signed)
Summary: Kaitlyn Solomon, FALLING   Vital Signs:  Patient profile:   45 year old female Height:      62 inches Weight:      147 pounds BMI:     26.98 Temp:     98.7 degrees F oral Pulse rate:   80 / minute Pulse rhythm:   regular BP sitting:   130 / 100  (left arm) Cuff size:   regular  Vitals Entered By: Linde Gillis CMA Duncan Dull) (April 21, 2010 3:04 PM) CC: dizziness, falling   History of Present Illness: 45 yo here for multiple complaints.  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 Went to [facility] ER, per pt, no blood work, Veterinary surgeon or CT scans (we will attempt to get these records). Still having left rib pain with deep breaths.  Very Kaitlyn Solomon, BP has been elevated. Having headaches, sometimes associated with nausea and photophobia. Dizziness is not worsened with positional changes. Was previously on Enalapril/HCTZ but her rx ran and she did not have time to refill it.  Has had some fatigue as well. No other focal neurological symptoms.    No blood in stool.  Current Medications (verified): 1)  Multivitamins  Caps (Multiple Vitamin) .... Take One By Mouth Once Daily 2)  Ferrous Sulfate 325 (65 Fe) Mg Tabs (Ferrous Sulfate) .... Take One By Mouth Once Daily 3)  Miralax  Powd (Polyethylene Glycol 3350) .... Take One Scoop At Bedtime 4)  Effexor Xr 75 Mg Cp24 (Venlafaxine Hcl) .... Once Daily 5)  Lamictal 100 Mg Tabs (Lamotrigine) .... Take One Tablet By Mouth Daily 6)  Enalapril-Hydrochlorothiazide 5-12.5 Mg Tabs (Enalapril-Hydrochlorothiazide) .Marland Kitchen.. 1 Tab By Mouth Daily. 7)  Meloxicam 15 Mg Tabs (Meloxicam) .... One By Mouth Daily  Allergies: 1)  ! Codeine  Past History:  Past Medical History: Last updated: 07/13/2009 Current Problems:  DEPRESSION (ICD-311) IRRITABLE BOWEL SYNDROME (ICD-564.1) HYPERTENSION (ICD-401.9) HEADACHE, CHRONIC (ICD-784.0) ANXIETY (ICD-300.00) MIGRAINE HEADACHE (ICD-346.90) ANEMIA (ICD-285.9) bipolar disorder.  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  Alcohol Use - no Daily Caffeine Use Illicit Drug Use - no  Risk Factors: Smoking Status: quit (06/17/2009)  Review of Systems      See HPI General:  Complains of fatigue; denies weakness. Eyes:  Complains of light sensitivity; denies blurring. CV:  Complains of chest pain or discomfort. Resp:  Denies shortness of breath. GI:  Complains of nausea; denies abdominal pain and vomiting. Neuro:  Complains of falling down and headaches.  Physical Exam  General:  Well developed, well nourished, no acute distress.healthy appearing.   Mouth:  Oral mucosa and oropharynx without lesions or exudates.  Teeth in good repair. Chest Wall:  TTP over left chest wall Lungs:  difficulty taking deep breaths due to pain. no crackles and no wheezes.   Heart:  Normal rate and regular rhythm. S1 and S2 normal without gallop, murmur, click, rub or other extra sounds. Abdomen:  Bowel sounds positive,abdomen soft and non-tender without masses, organomegaly or hernias noted. Msk:  No deformity or  scoliosis noted of thoracic or lumbar spine.   Extremities:  no edema Neurologic:  alert & oriented X3, cranial nerves II-XII intact, gait normal, and DTRs symmetrical and normal.   Dix hallpike neg Skin:  Intact without suspicious lesions or rashes Psych:  Oriented X3, memory intact for recent and remote, good eye contact, and depressed affect., tearful.     Impression & Recommendations:  Problem # 1:  RIB PAIN,  LEFT SIDED (ICD-786.50) Assessment New s/p fall.  will check CXR, explained to pt that rib fractures do not always appear. Has allergy to codeine, will send in rx for meloxicam. Orders: T-2 View CXR (71020TC)  Problem # 2:  FATIGUE (ICD-780.79) Assessment: New May be related to her dizziness and medications. Will first check CMET, CBC and TSH to rule out other reversible causes. Orders: T-CMP with estimated GFR ([address]) T-CBC w/Diff (13086-57846) T-TSH (96295-28413)  Problem # 3:  DIZZINESS (ICD-780.4) Assessment: New ?due to untreated HTN, also ? migraines. Will restart BP medication.   FOllow up in 2 weeks, consider a head CT if no improvement.  Complete Medication List: 1)  Multivitamins Caps (Multiple vitamin) .... Take one by mouth once daily 2)  Ferrous Sulfate 325 (65 Fe) Mg Tabs (Ferrous sulfate) .... Take one by mouth once daily 3)  Miralax Powd (Polyethylene glycol 3350) .... Take one scoop at bedtime 4)  Effexor Xr 75 Mg Cp24 (Venlafaxine hcl) .... Once daily 5)  Lamictal 100 Mg Tabs (Lamotrigine) .... Take one tablet by mouth daily 6)  Enalapril-hydrochlorothiazide 5-12.5 Mg Tabs (Enalapril-hydrochlorothiazide) .Marland Kitchen.. 1 tab by mouth daily. 7)  Meloxicam 15 Mg Tabs (Meloxicam) .... One by mouth daily  Patient Instructions: 1)  please follow up in 2 weeks, sooner if symtpoms worsen. Prescriptions: MELOXICAM 15 MG TABS (MELOXICAM) one by mouth daily  #30 x 0   Entered and Authorized by:   Ruthe Mannan MD   Signed by:   Ruthe Mannan MD on 04/21/2010   Method used:   Electronically to        [facility] [address] ([facility]ail)       [address], [address]       [place], [place]  [address]       Ph: [phone]       Fax: [phone]   RxID:   [id number] ENALAPRIL-HYDROCHLOROTHIAZIDE 5-12.5 MG TABS (ENALAPRIL-HYDROCHLOROTHIAZIDE) 1 tab by mouth daily.  #30 x 6   Entered and Authorized by:   Ruthe Mannan MD   Signed by:   Ruthe Mannan MD  on 04/21/2010   Method used:   Electronically to        [facility] [address]ail)       [address], [address]       [place], [place]  [address]       Ph: [phone]       Fax: [phone]   RxID:   [id number]    Orders Added: 1)  T-CMP with estimated GFR [[address]] 2)  T-CBC w/Diff [16073-71062] 3)  T-TSH [69485-46270] 4)  T-2 View CXR [71020TC] 5)  Est. Patient Level IV [[address]009]    Current Allergies (reviewed today): ! CODEINE

## 2010-05-05 ENCOUNTER — Other Ambulatory Visit: Payer: Self-pay | Admitting: *Deleted

## 2010-05-05 MED ORDER — ALPRAZOLAM 0.25 MG PO TABS: 0.2500 mg | ORAL_TABLET | Freq: Three times a day (TID) | ORAL | Status: DC | PRN

## 2010-05-05 NOTE — Telephone Encounter (Signed)
Pt states she was given script for xanax about a year ago and would like a refill.  She has been having anxiety attacks at school.  [facility]ses [facility] [address].

## 2010-05-05 NOTE — Telephone Encounter (Signed)
Rx called to [facility], patient notified as instructed via telephone.

## 2010-05-05 NOTE — Telephone Encounter (Signed)
Nikki, please enter in rx request. Thanks

## 2010-06-02 ENCOUNTER — Other Ambulatory Visit: Payer: Self-pay | Admitting: Family Medicine

## 2010-06-02 DIAGNOSIS — Z136 Encounter for screening for cardiovascular disorders: Secondary | ICD-10-CM

## 2010-06-02 DIAGNOSIS — I10 Essential (primary) hypertension: Secondary | ICD-10-CM

## 2010-06-05 ENCOUNTER — Other Ambulatory Visit: Payer: Managed Care, Other (non HMO)

## 2010-06-06 ENCOUNTER — Encounter: Payer: Self-pay | Admitting: Family Medicine

## 2010-06-06 LAB — HM PAP SMEAR

## 2010-06-06 LAB — HM MAMMOGRAPHY

## 2010-06-08 ENCOUNTER — Ambulatory Visit (INDEPENDENT_AMBULATORY_CARE_PROVIDER_SITE_OTHER): Payer: Managed Care, Other (non HMO) | Admitting: Family Medicine

## 2010-06-08 ENCOUNTER — Encounter: Payer: Self-pay | Admitting: Family Medicine

## 2010-06-08 DIAGNOSIS — E039 Hypothyroidism, unspecified: Secondary | ICD-10-CM

## 2010-06-08 DIAGNOSIS — D649 Anemia, unspecified: Secondary | ICD-10-CM

## 2010-06-08 DIAGNOSIS — Z0000 Encounter for general adult medical examination without abnormal findings: Secondary | ICD-10-CM

## 2010-06-08 DIAGNOSIS — I10 Essential (primary) hypertension: Secondary | ICD-10-CM

## 2010-06-08 DIAGNOSIS — Z23 Encounter for immunization: Secondary | ICD-10-CM

## 2010-06-08 DIAGNOSIS — Z136 Encounter for screening for cardiovascular disorders: Secondary | ICD-10-CM

## 2010-06-08 LAB — POCT URINALYSIS DIPSTICK
Bilirubin, UA: NEGATIVE
Ketones, UA: NEGATIVE

## 2010-06-08 LAB — CBC WITH DIFFERENTIAL/PLATELET
Basophils Relative: 0.5 % (ref 0.0–3.0)
Eosinophils Absolute: 0.1 10*3/uL (ref 0.0–0.7)
MCHC: 32 g/dL (ref 30.0–36.0)
MCV: 71.2 fl — ABNORMAL LOW (ref 78.0–100.0)
Monocytes Absolute: 0.6 10*3/uL (ref 0.1–1.0)
Neutrophils Relative %: 36.7 % — ABNORMAL LOW (ref 43.0–77.0)
Platelets: 262 10*3/uL (ref 150.0–400.0)
RBC: 5.39 Mil/uL — ABNORMAL HIGH (ref 3.87–5.11)
WBC: 7 10*3/uL (ref 4.5–10.5)

## 2010-06-08 LAB — BASIC METABOLIC PANEL
Chloride: 100 mEq/L (ref 96–112)
GFR: 82.17 mL/min (ref >60.00)
Glucose, Bld: 90 mg/dL (ref 70–99)
Potassium: 4.2 mEq/L (ref 3.5–5.1)
Sodium: 139 mEq/L (ref 135–145)

## 2010-06-08 LAB — LIPID PANEL
HDL: 63.4 mg/dL (ref >39.00)
Total CHOL/HDL Ratio: 3

## 2010-06-08 MED ORDER — HEPATITIS B VAC RECOMBINANT 5 MCG/0.5ML IJ SUSP: 0.5000 mL | Freq: Once | INTRAMUSCULAR | Status: DC

## 2010-06-08 NOTE — Progress Notes (Signed)
Addended by: Linde Gillis on: 06/08/2010 09:20 AM   Modules accepted: Orders

## 2010-06-08 NOTE — Progress Notes (Signed)
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 Brings in forms with her.    1.  Depression/bipolar disorder- diagnosed in 2004 after she had a manic episode.  Currently on Lamictal and Effexor and seeing psychiatrist, Dr. Ranae Palms in [place].    Denies any manic symptoms that she is aware of other than going several nights without sleeping.  No SI or HI.  2.  Hypothyroidism- on levothroid 75 mcg daily.  Denies any current symptoms of hypo or hyperthyroidism other than fatigue. Lab Results  Component Value Date   TSH 7.703* 04/21/2010   3.  HTN- OBGYN has prescrived Enalapril/HCTZ 25/12/5 mg daily for years.  No side effects, doing well.  No HA, blurred vision, CP or SOB.  No LE edema.   4.  Diverticulosis- followed by GI.   Takes Miralax nightly.  5. H/o anemia- has been more tired lately but working full time and attending school. No SOB or CP.  6.  Well woman- UTD on prevention.  s/p hysterectomy for menorrahgia in 08/2008.  The PMH, PSH, Social History, Family History, Medications, and allergies have been reviewed in [facility], and have been updated if relevant.   Review of Systems       See HPI General:  Complains of fatigue; denies chills and malaise. Eyes:  Denies blurring. ENT:   denies difficulty swallowing, ear discharge, earache, hoarseness, nasal congestion, nosebleeds, postnasal drainage, and ringing in ears. CV:  Denies difficulty breathing at night. Resp:  Denies shortness of breath. GI:  Denies abdominal pain, bloody stools, and change in bowel habits. GU:  Denies abnormal vaginal bleeding. MS:  Denies joint pain, joint redness, and joint swelling. Derm:  Denies rash. Neuro:  Denies headaches. Psych:  Denies anxiety, depression, mental problems, panic attacks, sense of great danger, suicidal thoughts/plans, thoughts of violence, unusual visions or sounds, and thoughts /plans of harming others. Endo:  Denies cold intolerance and heat intolerance. Heme:  Denies  abnormal bruising and bleeding. Allergy:  Denies seasonal allergies.  Physical Exam BP 140/90  Pulse 68  Temp(Src) 98.5 F (36.9 C) (Oral)  Ht 5\' 2"  (1.575 m)  Wt 145 lb 6.4 oz (65.953 kg)  BMI 26.59 kg/m2  General:  Well-developed,well-nourished,in no acute distress; alert,appropriate and cooperative throughout examination Head:  normocephalic and atraumatic.   Eyes:  vision grossly intact, pupils equal, pupils round, and pupils reactive to light.   Ears:  R ear normal and L ear normal.   Nose:  no external deformity.   Mouth:  good dentition.   Neck:  No deformities, masses, or tenderness noted. Breasts:  No mass, nodules, thickening, tenderness, bulging, retraction, inflamation, nipple discharge or skin changes noted.   Lungs:  Normal respiratory effort, chest expands symmetrically. Lungs are clear to auscultation, no crackles or wheezes. Heart:  Normal rate and regular rhythm. S1 and S2 normal without gallop, murmur, click, rub or other extra sounds. Abdomen:  Bowel sounds positive,abdomen soft and non-tender without masses, organomegaly or hernias noted. Rectal:  no external abnormalities.   Msk:  No deformity or scoliosis noted of thoracic or lumbar spine.   Extremities:  No clubbing, cyanosis, edema, or deformity noted with normal full range of motion of all joints.   Neurologic:  alert & oriented X3 and gait normal.   Skin:  Intact without suspicious lesions or rashes Cervical Nodes:  No lymphadenopathy noted Axillary Nodes:  No palpable lymphadenopathy Psych:  Cognition and judgment appear intact. Alert and cooperative with normal attention span and  concentration. No apparent delusions, illusions, hallucinations

## 2010-06-12 ENCOUNTER — Other Ambulatory Visit: Payer: Self-pay | Admitting: Family Medicine

## 2010-06-12 DIAGNOSIS — E039 Hypothyroidism, unspecified: Secondary | ICD-10-CM

## 2010-06-15 ENCOUNTER — Other Ambulatory Visit: Payer: Managed Care, Other (non HMO)

## 2010-06-26 ENCOUNTER — Other Ambulatory Visit: Payer: Self-pay | Admitting: Family Medicine

## 2010-07-07 ENCOUNTER — Ambulatory Visit (INDEPENDENT_AMBULATORY_CARE_PROVIDER_SITE_OTHER): Payer: Managed Care, Other (non HMO) | Admitting: Family Medicine

## 2010-07-07 DIAGNOSIS — Z23 Encounter for immunization: Secondary | ICD-10-CM

## 2010-07-07 MED ORDER — HEPATITIS B VAC RECOMBINANT 5 MCG/0.5ML IJ SUSP: 0.5000 mL | Freq: Once | INTRAMUSCULAR | Status: DC

## 2010-07-07 NOTE — Progress Notes (Signed)
Second Hep B vaccine given today during nurse visit.

## 2010-10-31 ENCOUNTER — Emergency Department: Payer: Self-pay | Admitting: Unknown Physician Specialty

## 2010-10-31 ENCOUNTER — Emergency Department (HOSPITAL_COMMUNITY)
Admission: EM | Admit: 2010-10-31 | Discharge: 2010-11-01 | Disposition: A | Discharge status: Home / Self Care | Payer: Managed Care, Other (non HMO) | Attending: Emergency Medicine | Admitting: Emergency Medicine

## 2010-10-31 DIAGNOSIS — R110 Nausea: Secondary | ICD-10-CM | POA: Insufficient documentation

## 2010-10-31 DIAGNOSIS — E278 Other specified disorders of adrenal gland: Secondary | ICD-10-CM | POA: Insufficient documentation

## 2010-10-31 DIAGNOSIS — R1031 Right lower quadrant pain: Secondary | ICD-10-CM | POA: Insufficient documentation

## 2010-10-31 DIAGNOSIS — K5732 Diverticulitis of large intestine without perforation or abscess without bleeding: Secondary | ICD-10-CM | POA: Insufficient documentation

## 2010-10-31 DIAGNOSIS — F319 Bipolar disorder, unspecified: Secondary | ICD-10-CM | POA: Insufficient documentation

## 2010-10-31 DIAGNOSIS — E039 Hypothyroidism, unspecified: Secondary | ICD-10-CM | POA: Insufficient documentation

## 2010-10-31 DIAGNOSIS — Z79899 Other long term (current) drug therapy: Secondary | ICD-10-CM | POA: Insufficient documentation

## 2010-10-31 DIAGNOSIS — R197 Diarrhea, unspecified: Secondary | ICD-10-CM | POA: Insufficient documentation

## 2010-10-31 DIAGNOSIS — I10 Essential (primary) hypertension: Secondary | ICD-10-CM | POA: Insufficient documentation

## 2010-10-31 LAB — DIFFERENTIAL
Eosinophils Absolute: 0.1 10*3/uL (ref 0.0–0.7)
Lymphocytes Relative: 36 % (ref 12–46)
Lymphs Abs: 3 10*3/uL (ref 0.7–4.0)
Monocytes Relative: 10 % (ref 3–12)
Neutro Abs: 4.4 10*3/uL (ref 1.7–7.7)
Neutrophils Relative %: 53 % (ref 43–77)

## 2010-10-31 LAB — CBC
HCT: 36.8 % (ref 36.0–46.0)
Hemoglobin: 12.1 g/dL (ref 12.0–15.0)
MCH: 22.3 pg — ABNORMAL LOW (ref 26.0–34.0)
MCHC: 32.9 g/dL (ref 30.0–36.0)
RBC: 5.42 MIL/uL — ABNORMAL HIGH (ref 3.87–5.11)

## 2010-10-31 LAB — COMPREHENSIVE METABOLIC PANEL
Alkaline Phosphatase: 58 U/L (ref 39–117)
Chloride: 101 mEq/L (ref 96–112)
GFR calc Af Amer: >60 mL/min (ref >60)
Glucose, Bld: 94 mg/dL (ref 70–99)
Potassium: 3.5 mEq/L (ref 3.5–5.1)
Total Protein: 7.7 g/dL (ref 6.0–8.3)

## 2010-10-31 LAB — LIPASE, BLOOD: Lipase: 33 U/L (ref 11–59)

## 2010-11-01 ENCOUNTER — Emergency Department (HOSPITAL_COMMUNITY): Payer: Managed Care, Other (non HMO)

## 2010-11-01 LAB — URINALYSIS, ROUTINE W REFLEX MICROSCOPIC
Hgb urine dipstick: NEGATIVE
Specific Gravity, Urine: 1.028 (ref 1.005–1.030)
pH: 6 (ref 5.0–8.0)

## 2010-11-01 LAB — URINE MICROSCOPIC-ADD ON

## 2010-11-01 MED ORDER — IOHEXOL 300 MG/ML  SOLN
100.0000 mL | Freq: Once | INTRAMUSCULAR | Status: AC | PRN
  Administered 2010-11-01: 100 mL via INTRAVENOUS

## 2010-12-04 ENCOUNTER — Encounter: Payer: Self-pay | Admitting: Family Medicine

## 2010-12-04 ENCOUNTER — Ambulatory Visit (INDEPENDENT_AMBULATORY_CARE_PROVIDER_SITE_OTHER): Payer: Managed Care, Other (non HMO) | Admitting: Family Medicine

## 2010-12-04 VITALS — BP 130/80 | HR 90 | Temp 98.2°F | Ht 62.0 in | Wt 150.8 lb

## 2010-12-04 DIAGNOSIS — E039 Hypothyroidism, unspecified: Secondary | ICD-10-CM

## 2010-12-04 DIAGNOSIS — G43909 Migraine, unspecified, not intractable, without status migrainosus: Secondary | ICD-10-CM | POA: Insufficient documentation

## 2010-12-04 DIAGNOSIS — Z8659 Personal history of other mental and behavioral disorders: Secondary | ICD-10-CM

## 2010-12-04 MED ORDER — SUMATRIPTAN SUCCINATE 50 MG PO TABS: 50.0000 mg | ORAL_TABLET | Freq: Once | ORAL | Status: DC | PRN

## 2010-12-04 MED ORDER — ALPRAZOLAM 0.25 MG PO TABS: 0.2500 mg | ORAL_TABLET | Freq: Three times a day (TID) | ORAL | Status: DC | PRN

## 2010-12-04 NOTE — Progress Notes (Signed)
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 Brings in forms with her.    1.  Depression/bipolar disorder- diagnosed in 2004 after she had a manic episode.  Currently on Lamictal, Tegretol and Effexor and seeing psychiatrist, Dr. Ranae Palms in [place].     Denies any manic symptoms that she is aware of other than going several nights without sleeping.  No SI or HI. She does have episodes of severe depression, most recently a couple of months ago. Wants me to manage her medication.   2.  Hypothyroidism- on levothroid 75 mcg daily.  Denies any current symptoms of hypo or hyperthyroidism. Lab Results  Component Value Date   TSH 1.56 06/08/2010   3.  HA- having a lot of untilateral (both r and left) front headaches, associated with photophobia, phonophobia and nausea, no vomiting. No focal neurological symptoms. Has never been treated for headaches.  Having one or two per month. Takes Tylenol but usually does not help.  The PMH, PSH, Social History, Family History, Medications, and allergies have been reviewed in [facility], and have been updated if relevant.   Review of Systems       See HPI  Physical Exam BP 130/80  Pulse 90  Temp(Src) 98.2 F (36.8 C) (Oral)  Ht 5\' 2"  (1.575 m)  Wt 150 lb 12 oz (68.38 kg)  BMI 27.57 kg/m2  General:  Well-developed,well-nourished,in no acute distress; alert,appropriate and cooperative throughout examination Head:  normocephalic and atraumatic.   Eyes:  vision grossly intact, pupils equal, pupils round, and pupils reactive to light.   Ears:  R ear normal and L ear normal.   Nose:  no external deformity.   Mouth:  good dentition.   Lungs:  Normal respiratory effort, chest expands symmetrically. Lungs are clear to auscultation, no crackles or wheezes. Heart:  Normal rate and regular rhythm. S1 and S2 normal without gallop, murmur, click, rub or other extra sounds. Msk:  No deformity or scoliosis noted of thoracic or lumbar spine.   Extremities:   No clubbing, cyanosis, edema, or deformity noted with normal full range of motion of all joints.   Neurologic:  alert & oriented X3 and gait normal.   Skin:  Intact without suspicious lesions or rashes Psych:  Cognition and judgment appear intact. Alert and cooperative with normal attention span and concentration. No apparent delusions, illusions, hallucinations  Assessment and Plan: 1. BIPOLAR AFFECTIVE DISORDER, MANIC, HX OF   Currently relatively stable. I prefer for psychiatry to handle this.  I did ask her to call for [facility] assistance to help with our office visits. The patient indicates understanding of these issues and agrees with the plan.    2. Hypothyroidism  Stable. On Synthroid 75 mcg daily. Recheck labs today. TSH, T4, free  3. Migraine  New.  Advised headache journal. Start Imitrex as needed for abortive therapy.

## 2010-12-04 NOTE — Patient Instructions (Signed)
Great to see you. Please stop by up front and ask for a handout about the [facility] assistance program.

## 2010-12-05 ENCOUNTER — Encounter: Payer: Self-pay | Admitting: *Deleted

## 2010-12-05 LAB — TSH: TSH: 1.53 u[IU]/mL (ref 0.35–5.50)

## 2010-12-08 ENCOUNTER — Ambulatory Visit (INDEPENDENT_AMBULATORY_CARE_PROVIDER_SITE_OTHER): Payer: Managed Care, Other (non HMO) | Admitting: *Deleted

## 2010-12-08 DIAGNOSIS — Z23 Encounter for immunization: Secondary | ICD-10-CM

## 2010-12-08 NOTE — Progress Notes (Signed)
Patient received 3rd Hep B vaccine during nurse visit today.

## 2011-04-18 ENCOUNTER — Telehealth: Payer: Self-pay | Admitting: *Deleted

## 2011-04-18 NOTE — Telephone Encounter (Signed)
Patient called to let Dr. Dayton Martes know that she needs a refill on Xanax but she stated that the current dose she is taking 0.25 doesn't seem strong enough anymore.  She wants to know if Dr. Dayton Martes will increase the dose?  Please advise.

## 2011-04-19 MED ORDER — ALPRAZOLAM 0.5 MG PO TABS: 0.5000 mg | ORAL_TABLET | Freq: Three times a day (TID) | ORAL | Status: AC | PRN

## 2011-04-19 NOTE — Telephone Encounter (Signed)
Please phone in as entered below. 

## 2011-04-19 NOTE — Telephone Encounter (Signed)
Script called to [facility] [address].  Unable to contact patient to advise- home number is fax machine, unable to reach on cell phone.

## 2011-06-13 DIAGNOSIS — E559 Vitamin D deficiency, unspecified: Secondary | ICD-10-CM

## 2011-06-13 HISTORY — DX: Vitamin D deficiency, unspecified: E55.9

## 2011-07-05 DIAGNOSIS — Z803 Family history of malignant neoplasm of breast: Secondary | ICD-10-CM

## 2011-07-05 HISTORY — DX: Family history of malignant neoplasm of breast: Z80.3

## 2012-01-01 ENCOUNTER — Ambulatory Visit: Payer: Managed Care, Other (non HMO) | Admitting: Family Medicine

## 2012-01-03 ENCOUNTER — Ambulatory Visit: Payer: Managed Care, Other (non HMO) | Admitting: Family Medicine

## 2012-01-29 ENCOUNTER — Other Ambulatory Visit: Payer: Self-pay | Admitting: Family Medicine

## 2012-01-29 NOTE — Telephone Encounter (Signed)
Pt requesting refill on alprazolam 0.5mg  tablet.  Last filled 3/13 according to pt.  Please advise.

## 2012-01-29 NOTE — Telephone Encounter (Signed)
Ok to call in one month supply with no refills.

## 2012-01-30 ENCOUNTER — Emergency Department: Payer: Self-pay | Admitting: Emergency Medicine

## 2012-01-30 ENCOUNTER — Other Ambulatory Visit: Payer: Self-pay | Admitting: Family Medicine

## 2012-01-30 LAB — CBC
HCT: 40.3 % (ref 35.0–47.0)
HGB: 12.7 g/dL (ref 12.0–16.0)
MCV: 66 fL — ABNORMAL LOW (ref 80–100)
RBC: 6.06 10*6/uL — ABNORMAL HIGH (ref 3.80–5.20)
RDW: 16.4 % — ABNORMAL HIGH (ref 11.5–14.5)
WBC: 8.2 10*3/uL (ref 3.6–11.0)

## 2012-01-30 LAB — BASIC METABOLIC PANEL
BUN: 20 mg/dL — ABNORMAL HIGH (ref 7–18)
Chloride: 106 mmol/L (ref 98–107)
Co2: 27 mmol/L (ref 21–32)
Glucose: 89 mg/dL (ref 65–99)
Potassium: 3.5 mmol/L (ref 3.5–5.1)
Sodium: 139 mmol/L (ref 136–145)

## 2012-01-30 LAB — URINALYSIS, COMPLETE
Blood: NEGATIVE
Glucose,UR: NEGATIVE mg/dL (ref 0–75)
Leukocyte Esterase: NEGATIVE
Nitrite: NEGATIVE
Ph: 7 (ref 4.5–8.0)
Protein: NEGATIVE
RBC,UR: 1 /HPF (ref 0–5)
WBC UR: 1 /HPF (ref 0–5)

## 2012-01-30 MED ORDER — ALPRAZOLAM 0.5 MG PO TABS: ORAL_TABLET | ORAL | Status: DC

## 2012-01-30 NOTE — Telephone Encounter (Signed)
Medicine called to [facility].  Advised patient.

## 2012-01-31 LAB — WET PREP, GENITAL

## 2012-02-04 ENCOUNTER — Emergency Department (HOSPITAL_COMMUNITY)
Admission: EM | Admit: 2012-02-04 | Discharge: 2012-02-04 | Disposition: A | Discharge status: Home / Self Care | Payer: Managed Care, Other (non HMO) | Attending: Emergency Medicine | Admitting: Emergency Medicine

## 2012-02-04 ENCOUNTER — Encounter (HOSPITAL_COMMUNITY): Payer: Self-pay | Admitting: *Deleted

## 2012-02-04 DIAGNOSIS — Z87891 Personal history of nicotine dependence: Secondary | ICD-10-CM | POA: Insufficient documentation

## 2012-02-04 DIAGNOSIS — F411 Generalized anxiety disorder: Secondary | ICD-10-CM | POA: Insufficient documentation

## 2012-02-04 DIAGNOSIS — Z8719 Personal history of other diseases of the digestive system: Secondary | ICD-10-CM | POA: Insufficient documentation

## 2012-02-04 DIAGNOSIS — F329 Major depressive disorder, single episode, unspecified: Secondary | ICD-10-CM | POA: Insufficient documentation

## 2012-02-04 DIAGNOSIS — M542 Cervicalgia: Secondary | ICD-10-CM | POA: Insufficient documentation

## 2012-02-04 DIAGNOSIS — Z8679 Personal history of other diseases of the circulatory system: Secondary | ICD-10-CM | POA: Insufficient documentation

## 2012-02-04 DIAGNOSIS — F3289 Other specified depressive episodes: Secondary | ICD-10-CM | POA: Insufficient documentation

## 2012-02-04 DIAGNOSIS — R51 Headache: Secondary | ICD-10-CM | POA: Insufficient documentation

## 2012-02-04 DIAGNOSIS — D649 Anemia, unspecified: Secondary | ICD-10-CM | POA: Insufficient documentation

## 2012-02-04 DIAGNOSIS — Z90710 Acquired absence of both cervix and uterus: Secondary | ICD-10-CM | POA: Insufficient documentation

## 2012-02-04 DIAGNOSIS — F319 Bipolar disorder, unspecified: Secondary | ICD-10-CM | POA: Insufficient documentation

## 2012-02-04 DIAGNOSIS — Z79899 Other long term (current) drug therapy: Secondary | ICD-10-CM | POA: Insufficient documentation

## 2012-02-04 DIAGNOSIS — I10 Essential (primary) hypertension: Secondary | ICD-10-CM

## 2012-02-04 DIAGNOSIS — Z8742 Personal history of other diseases of the female genital tract: Secondary | ICD-10-CM | POA: Insufficient documentation

## 2012-02-04 LAB — POCT I-STAT, CHEM 8
Hemoglobin: 13.6 g/dL (ref 12.0–15.0)
Sodium: 141 mEq/L (ref 135–145)
TCO2: 27 mmol/L (ref 0–100)

## 2012-02-04 LAB — CBC
MCH: 21 pg — ABNORMAL LOW (ref 26.0–34.0)
MCHC: 31.9 g/dL (ref 30.0–36.0)
Platelets: 222 10*3/uL (ref 150–400)

## 2012-02-04 MED ORDER — KETOROLAC TROMETHAMINE 60 MG/2ML IM SOLN
60.0000 mg | Freq: Once | INTRAMUSCULAR | Status: AC
  Administered 2012-02-04: 60 mg via INTRAMUSCULAR
  Filled 2012-02-04: qty 2

## 2012-02-04 MED ORDER — CYCLOBENZAPRINE HCL 10 MG PO TABS: 10.0000 mg | ORAL_TABLET | Freq: Two times a day (BID) | ORAL | Status: DC | PRN

## 2012-02-04 MED ORDER — HYDROCHLOROTHIAZIDE 25 MG PO TABS: 25.0000 mg | ORAL_TABLET | Freq: Every day | ORAL | Status: DC

## 2012-02-04 MED ORDER — HYDROCHLOROTHIAZIDE 25 MG PO TABS
25.0000 mg | ORAL_TABLET | Freq: Once | ORAL | Status: AC
  Administered 2012-02-04: 25 mg via ORAL
  Filled 2012-02-04: qty 1

## 2012-02-04 MED ORDER — NAPROXEN 375 MG PO TABS: 375.0000 mg | ORAL_TABLET | Freq: Two times a day (BID) | ORAL | Status: DC

## 2012-02-04 NOTE — ED Provider Notes (Signed)
History     CSN: [id number]  Arrival date & time 02/04/12  1610   First MD Initiated Contact with Patient 02/04/12 2052      Chief Complaint  Patient presents with  . Headache  . Neck Pain    (Consider location/radiation/quality/duration/timing/severity/associated sxs/prior treatment) HPI Kaitlyn Solomon is a 46 y.o. female who presents with complaint of a headache. States pain started about 2 wks ago. States pain comes and goes. Mainly in the back of the head radiating into the neck. Denies fever, chills, neck stiffness. States hx of migraines, has been taking excedrin migraine with some relief. States was seen at [facility], was told her BP was very high. States has taken herself off of her BP medications about 3 months ago. Was taking enalapril. States it was not working so she felt like there was no reason for taking it. Pt states she has also not been eating well, skipping meals and not sleeping well at night. She denies any recent head injuries. Denies visual changes, weakness in extremities, difficulty walking or speaking. States at [place], had blood work done that was negative and had CT scan done of her head and was told it was normal. State nothing makes her headache better or worse, states this morning had no headache, worsened as the day went on.    Past Medical History  Diagnosis Date  . Depressive disorder, not elsewhere classified   . Irritable bowel syndrome   . Unspecified essential hypertension   . Headache   . Anxiety state, unspecified   . Migraine, unspecified, without mention of intractable migraine without mention of status migrainosus   . Anemia, unspecified   . Bipolar disorder   . Fibroid tumor     Past Surgical History  Procedure Date  . Abdominal hysterectomy 08/2008  . Dilation and curettage of uterus     Family History  Problem Relation Age of Onset  . Cancer Mother     breast  . Diabetes Brother   . Cancer Paternal Aunt     breast   . Diabetes Paternal Aunt   . Cancer Maternal Grandmother     breast  . Diabetes Maternal Grandmother     History  Substance Use Topics  . Smoking status: Former Games developer  . Smokeless tobacco: Not on file  . Alcohol Use: No    OB History    Grav Para Term Preterm Abortions TAB SAB Ect Mult Living                  Review of Systems  HENT: Negative for neck pain and neck stiffness.   Eyes: Negative for photophobia, pain and visual disturbance.  Skin: Negative for rash.  Neurological: Positive for headaches. Negative for dizziness, weakness, light-headedness and numbness.  All other systems reviewed and are negative.    Allergies  Codeine and Lamotrigine  Home Medications   Current Outpatient Rx  Name  Route  Sig  Dispense  Refill  . ALPRAZOLAM 0.5 MG PO TABS   Oral   Take 0.5 mg by mouth as needed. For anxiety         . ASPIRIN-ACETAMINOPHEN-CAFFEINE 250-250-65 MG PO TABS   Oral   Take 2 tablets by mouth every 6 (six) hours as needed. For migraines         . FERROUS SULFATE 325 (65 FE) MG PO TABS   Oral   Take 325 mg by mouth daily.           Marland Kitchen  FLUCONAZOLE 150 MG PO TABS   Oral   Take 150 mg by mouth once.         Marland Kitchen LEVOTHYROXINE SODIUM 75 MCG PO TABS               . MULTIVITAMIN PO   Oral   Take 1 tablet by mouth daily.          Marland Kitchen LAMOTRIGINE 100 MG PO TABS   Oral   Take 100 mg by mouth daily.          . SUMATRIPTAN SUCCINATE 50 MG PO TABS   Oral   Take 50 mg by mouth once as needed. Took one tab once last week for migraine.           BP 154/104  Pulse 63  Temp 98.3 F (36.8 C) (Oral)  Resp 18  SpO2 100%  Physical Exam  Nursing note and vitals reviewed. Constitutional: She is oriented to person, place, and time. She appears well-developed and well-nourished. No distress.  HENT:  Head: Normocephalic and atraumatic.  Right Ear: External ear normal.  Left Ear: External ear normal.  Nose: Nose normal.  Mouth/Throat:  Oropharynx is clear and moist.       TMs normal bilaterally  Eyes: Conjunctivae normal and EOM are normal. Pupils are equal, round, and reactive to light.  Neck: Normal range of motion. Neck supple.       No nuchal rigidity  Cardiovascular: Normal rate, regular rhythm and normal heart sounds.   Pulmonary/Chest: Effort normal and breath sounds normal. No respiratory distress. She has no wheezes. She has no rales.  Abdominal: Soft. Bowel sounds are normal. She exhibits no distension. There is no tenderness. There is no rebound.  Musculoskeletal: She exhibits no edema.  Lymphadenopathy:    She has no cervical adenopathy.  Neurological: She is alert and oriented to person, place, and time. No cranial nerve deficit. Coordination normal.       5/5 and equal upper and lower extremity strength bilaterally. Equal grip strength bilaterally. Normal finger to nose and heel to shin. No pronator drift.   Skin: Skin is warm and dry. No rash noted.  Psychiatric: She has a normal mood and affect. Her behavior is normal.    ED Course  Procedures (including critical care time)  Results for orders placed during the hospital encounter of 02/04/12  CBC      Component Value Range   WBC 6.0  4.0 - 10.5 K/uL   RBC 5.76 (*) 3.87 - 5.11 MIL/uL   Hemoglobin 12.1  12.0 - 15.0 g/dL   HCT 16.1  09.6 - 04.5 %   MCV 65.8 (*) 78.0 - 100.0 fL   MCH 21.0 (*) 26.0 - 34.0 pg   MCHC 31.9  30.0 - 36.0 g/dL   RDW 40.9 (*) 81.1 - 91.4 %   Platelets PENDING  150 - 400 K/uL  POCT I-STAT, CHEM 8      Component Value Range   Sodium 141  135 - 145 mEq/L   Potassium 3.8  3.5 - 5.1 mEq/L   Chloride 104  96 - 112 mEq/L   BUN 13  6 - 23 mg/dL   Creatinine, Ser 7.82  0.50 - 1.10 mg/dL   Glucose, Bld 85  70 - 99 mg/dL   Calcium, Ion 9.56  2.13 - 1.23 mmol/L   TCO2 27  0 - 100 mmol/L   Hemoglobin 13.6  12.0 - 15.0 g/dL   HCT 40.0  36.0 - 46.0 %   No results found.  10:51 PM Pt's headache is gone with toradol 60mg  IM. Her  labs are normal. BP down to 146/93. Pt stable for d/c home with follow up.   1. Headache   2. Hypertension       MDM  PT with headache and neck pain for 2 weeks, on and off. Pt has no fever, no URI symptoms, no nuchal rigidity, doubt meningitis. Doubt intracranial hemorrhage. Normal neuro exam. She does have hx of migraines, and did come in with BP of 176/106. Her BP now is improved it is 147/100 and she is feeling better. Will try to restart on a different BP meds, she refusing enalapril because "it is not working." will tray HCTZ.   Pt's headache resolved with toradol IM. Pt in no distress. BP down to 140/90. Pt feeling well, ready for d/c home. 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 Asked for note for the job. Will also try flexeril for neck pain.         Lottie Mussel, PA 02/05/12 [phone]

## 2012-02-04 NOTE — ED Notes (Signed)
Pt c/o right sided HA and neck pain; pt seen for same last week and told had htn; pt sts some improvement but not resolved

## 2012-02-05 NOTE — ED Provider Notes (Signed)
Medical screening examination/treatment/procedure(s) were performed by non-physician practitioner and as supervising physician I was immediately available for consultation/collaboration.   Carleene Cooper III, MD 02/05/12 [phone]

## 2012-04-01 ENCOUNTER — Ambulatory Visit (INDEPENDENT_AMBULATORY_CARE_PROVIDER_SITE_OTHER): Payer: Managed Care, Other (non HMO) | Admitting: Family Medicine

## 2012-04-01 ENCOUNTER — Encounter: Payer: Self-pay | Admitting: Family Medicine

## 2012-04-01 ENCOUNTER — Other Ambulatory Visit: Payer: Self-pay | Admitting: Family Medicine

## 2012-04-01 VITALS — BP 132/84 | HR 78 | Temp 98.4°F | Ht 62.0 in | Wt 150.5 lb

## 2012-04-01 DIAGNOSIS — E039 Hypothyroidism, unspecified: Secondary | ICD-10-CM

## 2012-04-01 DIAGNOSIS — N760 Acute vaginitis: Secondary | ICD-10-CM

## 2012-04-01 DIAGNOSIS — I10 Essential (primary) hypertension: Secondary | ICD-10-CM

## 2012-04-01 DIAGNOSIS — R718 Other abnormality of red blood cells: Secondary | ICD-10-CM

## 2012-04-01 DIAGNOSIS — R5381 Other malaise: Secondary | ICD-10-CM

## 2012-04-01 LAB — CBC WITH DIFFERENTIAL/PLATELET
Basophils Absolute: 0 10*3/uL (ref 0.0–0.1)
Basophils Relative: 0.6 % (ref 0.0–3.0)
HCT: 40.8 % (ref 36.0–46.0)
Hemoglobin: 12.8 g/dL (ref 12.0–15.0)
Lymphs Abs: 4.2 10*3/uL — ABNORMAL HIGH (ref 0.7–4.0)
Monocytes Relative: 6.1 % (ref 3.0–12.0)
Neutro Abs: 3.1 10*3/uL (ref 1.4–7.7)
RBC: 6.11 Mil/uL — ABNORMAL HIGH (ref 3.87–5.11)
RDW: 16.5 % — ABNORMAL HIGH (ref 11.5–14.6)

## 2012-04-01 LAB — TSH: TSH: 5.48 u[IU]/mL (ref 0.35–5.50)

## 2012-04-01 LAB — COMPREHENSIVE METABOLIC PANEL
ALT: 23 U/L (ref 0–35)
AST: 29 U/L (ref 0–37)
CO2: 26 mEq/L (ref 19–32)
Creatinine, Ser: 1.1 mg/dL (ref 0.4–1.2)
GFR: 70.98 mL/min (ref >60.00)
Total Bilirubin: 0.6 mg/dL (ref 0.3–1.2)

## 2012-04-01 LAB — T4, FREE: Free T4: 0.87 ng/dL (ref 0.60–1.60)

## 2012-04-01 MED ORDER — HYDROCHLOROTHIAZIDE 25 MG PO TABS: 25.0000 mg | ORAL_TABLET | Freq: Every day | ORAL | Status: DC

## 2012-04-01 NOTE — Progress Notes (Signed)
47 yo here for follow up-  1.  Depression/bipolar disorder- diagnosed in 2004 after she had a manic episode. No longer taking Lamictal or Effexor-- off all psych meds.  She is still seeing Dr. Ranae Palms as needed.   Denies any manic symptoms, anxiety or depression.  No SI or HI.  2.  Hypothyroidism- was on levothroid 75 mcg daily but ran out two weeks.  Denies any current symptoms of hypo or hyperthyroidism other than fatigue.  Has felt fatigued for several months. Lab Results  Component Value Date   TSH 1.53 12/04/2010   3.  HTN- on HCTZ 25 mg daliy. No side effects, doing well.  No HA, blurred vision, CP or SOB.  No LE edema.  Patient Active Problem List  Diagnosis  . ANEMIA  . ANXIETY  . DEPRESSION  . HYPERTENSION  . IRRITABLE BOWEL SYNDROME  . HEADACHE, CHRONIC  . BIPOLAR AFFECTIVE DISORDER, MANIC, HX OF  . Hypothyroidism  . Migraine  . Vaginitis and vulvovaginitis   Past Medical History  Diagnosis Date  . Depressive disorder, not elsewhere classified   . Irritable bowel syndrome   . Unspecified essential hypertension   . Headache   . Anxiety state, unspecified   . Migraine, unspecified, without mention of intractable migraine without mention of status migrainosus   . Anemia, unspecified   . Bipolar disorder   . Fibroid tumor    Past Surgical History  Procedure Laterality Date  . Abdominal hysterectomy  08/2008  . Dilation and curettage of uterus     History  Substance Use Topics  . Smoking status: Former Games developer  . Smokeless tobacco: Not on file  . Alcohol Use: No   Family History  Problem Relation Age of Onset  . Cancer Mother     breast  . Diabetes Brother   . Cancer Paternal Aunt     breast  . Diabetes Paternal Aunt   . Cancer Maternal Grandmother     breast  . Diabetes Maternal Grandmother    Allergies  Allergen Reactions  . Codeine     REACTION: rash  . Lamotrigine Other (See Comments)    tremors    Current Outpatient Prescriptions on File  Prior to Visit  Medication Sig Dispense Refill  . ALPRAZolam (XANAX) 0.5 MG tablet Take 0.5 mg by mouth as needed. For anxiety      . ferrous sulfate 325 (65 FE) MG tablet Take 325 mg by mouth daily.        Marland Kitchen levothyroxine (SYNTHROID, LEVOTHROID) 75 MCG tablet        Current Facility-Administered Medications on File Prior to Visit  Medication Dose Route Frequency Provider Last Rate Last Dose  . hepatitis B vac recombinant (RECOMBIVAX) injection 5 mcg  0.5 mL Intramuscular Once Dianne Dun, MD      . hepatitis B vac recombinant (RECOMBIVAX) injection 5 mcg  0.5 mL Intramuscular Once Dianne Dun, MD        The PMH, PSH, Social History, Family History, Medications, and allergies have been reviewed in [facility], and have been updated if relevant.   Review of Systems       See HPI General:  Complains of fatigue; denies chills and malaise. Eyes:  Denies blurring. ENT:   denies difficulty swallowing, ear discharge, earache, hoarseness, nasal congestion, nosebleeds, postnasal drainage, and ringing in ears. CV:  Denies difficulty breathing at night. Resp:  Denies shortness of breath. GI:  Denies abdominal pain, bloody stools, and change in bowel  habits. GU:  Denies abnormal vaginal bleeding. MS:  Denies joint pain, joint redness, and joint swelling. Derm:  Denies rash. Neuro:  Denies headaches. Psych:  Denies anxiety, depression, mental problems, panic attacks, sense of great danger, suicidal thoughts/plans, thoughts of violence, unusual visions or sounds, and thoughts /plans of harming others. Endo:  Denies cold intolerance and heat intolerance. Heme:  Denies abnormal bruising and bleeding. Allergy:  Denies seasonal allergies.  Physical Exam BP 132/84  Pulse 78  Temp(Src) 98.4 F (36.9 C) (Oral)  Ht 5\' 2"  (1.575 m)  Wt 150 lb 8 oz (68.266 kg)  BMI 27.52 kg/m2  SpO2 99%  General:  Well-developed,well-nourished,in no acute distress; alert,appropriate and cooperative throughout  examination Head:  normocephalic and atraumatic.   Eyes:  vision grossly intact, pupils equal, pupils round, and pupils reactive to light.   Ears:  R ear normal and L ear normal.   Nose:  no external deformity.   Mouth:  good dentition.   Neck:  No deformities, masses, or tenderness noted. Lungs:  Normal respiratory effort, chest expands symmetrically. Lungs are clear to auscultation, no crackles or wheezes. Heart:  Normal rate and regular rhythm. S1 and S2 normal without gallop, murmur, click, rub or other extra sounds. Abdomen:  Bowel sounds positive,abdomen soft and non-tender without masses, organomegaly or hernias noted. Rectal:  no external abnormalities.   Msk:  No deformity or scoliosis noted of thoracic or lumbar spine.   Extremities:  No clubbing, cyanosis, edema, or deformity noted with normal full range of motion of all joints.   Neurologic:  alert & oriented X3 and gait normal.   Skin:  Intact without suspicious lesions or rashes Psych:  Cognition and judgment appear intact. Alert and cooperative with normal attention span and concentration. No apparent delusions, illusions, hallucinations  Assessment and Plan: 1. HYPERTENSION Stable on HCTZ 25 mg daily. - Comprehensive metabolic panel   2. Hypothyroidism Now with worsening fatigue.  Ran out of synthroid two weeks ago.  Will check thyroid function today prior to refilling as she may need dose adjustment.  The patient indicates understanding of these issues and agrees with the plan.  - TSH - T4, Free  3. Other malaise and fatigue See above.  Will check other labs to rule out other possible contributing factors.  The patient indicates understanding of these issues and agrees with the plan.  - CBC with Differential - Vitamin D, 25-hydroxy

## 2012-04-01 NOTE — Patient Instructions (Addendum)
Good to see you. We will call you with your lab results.  Please remind [place] that you need refills when we call.

## 2012-04-02 ENCOUNTER — Telehealth: Payer: Self-pay

## 2012-04-02 LAB — VITAMIN D 25 HYDROXY (VIT D DEFICIENCY, FRACTURES): Vit D, 25-Hydroxy: 17 ng/mL — ABNORMAL LOW (ref 30–89)

## 2012-04-02 MED ORDER — ERGOCALCIFEROL 1.25 MG (50000 UT) PO CAPS: 50000.0000 [IU] | ORAL_CAPSULE | ORAL | Status: DC

## 2012-04-02 MED ORDER — LEVOTHYROXINE SODIUM 75 MCG PO TABS: 75.0000 ug | ORAL_TABLET | Freq: Every day | ORAL | Status: DC

## 2012-04-02 MED ORDER — FLUCONAZOLE 150 MG PO TABS: 150.0000 mg | ORAL_TABLET | Freq: Once | ORAL | Status: DC

## 2012-04-02 NOTE — Telephone Encounter (Signed)
Pt left v/m was seen 04/01/12 and pt was using OTC med for vaginal itching and discharge; pt finished OTC med. This morning pt has vaginal discharge, itching and irritation. Pt request med sent to [facility] [address]Please advise.

## 2012-04-02 NOTE — Telephone Encounter (Signed)
Advised patient

## 2012-04-02 NOTE — Telephone Encounter (Signed)
Rx for diflucan sent to pharmacy.  Please let us know if symptoms do not improve.

## 2012-04-02 NOTE — Addendum Note (Signed)
Addended by: Eliezer Bottom on: 04/02/2012 10:12 AM   Modules accepted: Orders

## 2012-04-07 ENCOUNTER — Telehealth: Payer: Self-pay | Admitting: Oncology

## 2012-04-07 NOTE — Telephone Encounter (Signed)
S/W pt in re NP appt 03/18 @ 10:30 w/Dr. Clelia Croft.  Referring Dr. Dayton Martes Dx- [facility] Welcome packet emailed.

## 2012-04-10 ENCOUNTER — Telehealth: Payer: Self-pay | Admitting: Oncology

## 2012-04-10 NOTE — Telephone Encounter (Signed)
C/D 04/10/12 for appt. 04/29/12

## 2012-04-25 ENCOUNTER — Telehealth: Payer: Self-pay | Admitting: Oncology

## 2012-04-25 NOTE — Telephone Encounter (Signed)
S/W PT IN RE R/S APPT TO 4/8 @ 10:30 W/DR. SHADAD.  CALENDAR MAILED.

## 2012-04-29 ENCOUNTER — Ambulatory Visit: Payer: Managed Care, Other (non HMO)

## 2012-04-29 ENCOUNTER — Ambulatory Visit: Payer: Managed Care, Other (non HMO) | Admitting: Oncology

## 2012-04-29 ENCOUNTER — Other Ambulatory Visit: Payer: Managed Care, Other (non HMO) | Admitting: Lab

## 2012-05-16 ENCOUNTER — Other Ambulatory Visit: Payer: Self-pay | Admitting: Oncology

## 2012-05-16 DIAGNOSIS — D649 Anemia, unspecified: Secondary | ICD-10-CM

## 2012-05-19 ENCOUNTER — Telehealth: Payer: Self-pay | Admitting: *Deleted

## 2012-05-19 NOTE — Telephone Encounter (Signed)
Called pt to confirm MD appt 05/20/12. Pt verbalized and confirmed she will be here.

## 2012-05-20 ENCOUNTER — Ambulatory Visit (HOSPITAL_BASED_OUTPATIENT_CLINIC_OR_DEPARTMENT_OTHER): Payer: Managed Care, Other (non HMO) | Admitting: Oncology

## 2012-05-20 ENCOUNTER — Ambulatory Visit: Payer: Managed Care, Other (non HMO)

## 2012-05-20 ENCOUNTER — Encounter: Payer: Self-pay | Admitting: Oncology

## 2012-05-20 ENCOUNTER — Telehealth: Payer: Self-pay | Admitting: Oncology

## 2012-05-20 ENCOUNTER — Other Ambulatory Visit (HOSPITAL_BASED_OUTPATIENT_CLINIC_OR_DEPARTMENT_OTHER): Payer: Managed Care, Other (non HMO) | Admitting: Lab

## 2012-05-20 VITALS — BP 155/97 | HR 73 | Temp 97.0°F | Resp 18 | Ht 62.0 in | Wt 154.6 lb

## 2012-05-20 DIAGNOSIS — D649 Anemia, unspecified: Secondary | ICD-10-CM

## 2012-05-20 LAB — COMPREHENSIVE METABOLIC PANEL (CC13)
Alkaline Phosphatase: 45 U/L (ref 40–150)
BUN: 19.5 mg/dL (ref 7.0–26.0)
CO2: 29 mEq/L (ref 22–29)
Glucose: 89 mg/dl (ref 70–99)
Total Bilirubin: 0.45 mg/dL (ref 0.20–1.20)

## 2012-05-20 LAB — CBC WITH DIFFERENTIAL/PLATELET
Basophils Absolute: 0 10*3/uL (ref 0.0–0.1)
EOS%: 1.2 % (ref 0.0–7.0)
Eosinophils Absolute: 0.1 10*3/uL (ref 0.0–0.5)
HCT: 38.9 % (ref 34.8–46.6)
HGB: 12.2 g/dL (ref 11.6–15.9)
LYMPH%: 47.8 % (ref 14.0–49.7)
MCH: 20.7 pg — ABNORMAL LOW (ref 25.1–34.0)
MCV: 66.2 fL — ABNORMAL LOW (ref 79.5–101.0)
MONO%: 8.6 % (ref 0.0–14.0)
NEUT%: 42 % (ref 38.4–76.8)
Platelets: 261 10*3/uL (ref 145–400)
RDW: 16.3 % — ABNORMAL HIGH (ref 11.2–14.5)

## 2012-05-20 NOTE — Progress Notes (Signed)
Checked in new patient. No financial issues. °

## 2012-05-20 NOTE — Telephone Encounter (Signed)
Gave pt appt for lab and MD on October 2014 °

## 2012-05-20 NOTE — Progress Notes (Signed)
Note dictated

## 2012-05-21 NOTE — Progress Notes (Signed)
CC:   Kaitlyn Solomon, M.D.  REASON FOR CONSULTATION:  Microcytosis and increase in RBC count.  HISTORY OF PRESENT ILLNESS:  Kaitlyn Solomon is a pleasant 47 year old woman, a native of [place], currently lives in [place].  She is a pleasant woman with a history of depression, bipolar disorder, as well as a history of hypothyroidism.  She gets her routine care with Dr. Dayton Martes at [facility].  Her most recent laboratory data on 04/01/2012 showed her hemoglobin was normal at 12.8, her white cell count was 7.8, her MCV was low at 66, and RDW was high at 16.5, and for that reason, patient referred to me for evaluation.  Historically, looking back a year ago, her hemoglobin was down at 11.3, her red cells were at 5.2, and MCV of 69.  Looking back at previous laboratory data dating back to 2012, her hemoglobins have ranged between 11 and 12.  Her MCVs have been chronically low, hovering between 66 and 71, and her red cell numbers also have fluctuated between 5 and 6.  Clinically, she has symptoms, but predominantly related to her thyroid disease and depression.  Does report some fatigue and tiredness, but is still able to work full-time without any problems.  She had not had any hematochezia, had not reported any melena.  She has had a hysterectomy, so she is no longer reporting any menstrual cycles.  She is able to perform activities of daily living without any hindrance or decline.  REVIEW OF SYSTEMS:  Does not report any headaches, blurry vision, double vision.  Does not report any motor or sensory neuropathy.  Does not report any alteration in mental status, psychiatric issues, depression. Does not report any fever, chills, sweats.  Does not report any cough, hemoptysis, hematemesis.  No nausea or vomiting, abdominal pain, hematochezia, melena, or genitourinary complaints.  Rest of review of system is unremarkable.  PAST MEDICAL HISTORY:  Significant for depression, bipolar  disorder, hypothyroidism, history of migraines and anemia.  She has had fibroid tumor, status post hysterectomy and tubal ligation.  MEDICATIONS:  She is on iron sulfate, she is on Synthroid, and Xanax as needed.  ALLERGIES:  Codeine and lamotrigine.  SOCIAL HISTORY:  She denied any smoking or alcohol.  She has 4 children. Works in a [employer].  FAMILY HISTORY:  No history of any sickle cell anemia or thalassemia. Her mother had breast cancer.  PHYSICAL EXAMINATION:  General:  Alert, awake, pleasant woman who appeared in no active distress.  Vital Signs:  Blood pressure is 155/97, pulse 73, respirations 18, temperature is 97.  HEENT:  Head is normocephalic, atraumatic.  Pupils equal and round, reactive to light. Oral mucosa moist and pink.  Neck:  Supple without adenopathy.  Heart: Regular rate and rhythm, S1 and S2.  Lungs:  Clear to auscultation. Abdomen:  Soft, nontender.  No hepatosplenomegaly.  Extremities:  No edema.  LABORATORY DATA:  Showed a hemoglobin of 12, white cell count of 7.8, platelet count of 261.  Her MCV at 66, her RDW at 16.3, RBC of 5.88.  ASSESSMENT AND PLAN:  A 47 year old woman with microcytosis without anemia.  Differential diagnosis was discussed today and the most likely etiology for her microcytosis, other than related to iron deficiency, other causes would include a hemoglobinopathy such as sickle cell, thalassemia, or could be combination of the above.  Overall, I think her findings are chronic, they are relatively mild and benign, and certainly asymptomatic.  For the time being, she should continue  to be on iron. Her hemoglobin is excellent.  Does not really necessarily need any extensive workup.  None of her children have anemia.  I think at this point, really a diagnosis of hemoglobinopathy would be of little clinical significance; however, I would repeat her blood counts in 6 months and if her hemoglobin is drifting down, I will certainly  send her hemoglobin electrophoresis and continue to encourage her to take her iron supplements, and she will probably need a GI workup if that has not been done.  She is approaching the age of 2, she probably would benefit from a colonoscopy.  All her questions were answered today.    ______________________________ Benjiman Core, M.D. FNS/MEDQ  D:  05/20/2012  T:  05/21/2012  Job:  [id number]

## 2012-06-12 ENCOUNTER — Other Ambulatory Visit (INDEPENDENT_AMBULATORY_CARE_PROVIDER_SITE_OTHER): Payer: Managed Care, Other (non HMO)

## 2012-06-12 ENCOUNTER — Other Ambulatory Visit: Payer: Self-pay

## 2012-06-12 ENCOUNTER — Other Ambulatory Visit: Payer: Self-pay | Admitting: Family Medicine

## 2012-06-12 DIAGNOSIS — E559 Vitamin D deficiency, unspecified: Secondary | ICD-10-CM

## 2012-06-12 DIAGNOSIS — E039 Hypothyroidism, unspecified: Secondary | ICD-10-CM

## 2012-06-12 MED ORDER — ALPRAZOLAM 0.5 MG PO TABS: 0.5000 mg | ORAL_TABLET | ORAL | Status: DC | PRN

## 2012-06-12 NOTE — Telephone Encounter (Signed)
I don't prescribe armour as it is difficult to adjust and maintain thyroid levels.  I can refer to endocrinology if she would prefer.

## 2012-06-12 NOTE — Telephone Encounter (Signed)
Pt left note requesting refill alprazolam to [facility] [address]. Pt also request changing thyroid med to natural holistic thyroid med.Please advise.

## 2012-06-12 NOTE — Telephone Encounter (Signed)
Medicine called to [facility].  Pt does want referral to endocrinologist.  She would prefer to be seen here on a Wednesday morning.

## 2012-06-12 NOTE — Telephone Encounter (Signed)
Referral placed.

## 2012-06-17 ENCOUNTER — Telehealth: Payer: Self-pay

## 2012-06-17 NOTE — Telephone Encounter (Signed)
Pt left v/m she has discussed previously with Dr Dayton Martes about changing thyroid med from synthetic to natural thyroid med; pt wants to try the Armour thyroid. Pt said she feels tired, no energy, sleepy, hear continuing to come out, gaining weight and skin is dry and itchy; pt said she has been taking levothyroxine and cannot tell she is taking anything. Pt request change to Armour thyroid.[facility] [address].pt request call back when called in.

## 2012-06-17 NOTE — Telephone Encounter (Signed)
Please see phone note from 5/1- we explained to her then that I no longer prescribe Armour because it is difficult to manage.  This is outside my scope of practice and why I referred her to an endocrinologist.

## 2012-06-18 NOTE — Telephone Encounter (Signed)
Patient notified as instructed by telephone. Was advised by patient that she has already scheduled an appointment with the endocrinologist.

## 2012-07-02 ENCOUNTER — Ambulatory Visit: Payer: Managed Care, Other (non HMO) | Admitting: Internal Medicine

## 2012-08-08 ENCOUNTER — Telehealth: Payer: Self-pay | Admitting: Family Medicine

## 2012-08-08 MED ORDER — FLUCONAZOLE 150 MG PO TABS: 150.0000 mg | ORAL_TABLET | Freq: Once | ORAL | Status: DC

## 2012-08-08 NOTE — Telephone Encounter (Signed)
Rx sent.  If symptoms persist, needs to be seen.

## 2012-08-08 NOTE — Telephone Encounter (Signed)
Left message on voice mail advising patient script has been sent to pharmacy and that she will need to be seen in not improving.

## 2012-08-08 NOTE — Telephone Encounter (Signed)
Pt requesting Diflucan for yeast infection to be called in to [place] on [address].

## 2012-08-19 ENCOUNTER — Encounter: Payer: Self-pay | Admitting: *Deleted

## 2012-08-20 ENCOUNTER — Ambulatory Visit (INDEPENDENT_AMBULATORY_CARE_PROVIDER_SITE_OTHER): Payer: Managed Care, Other (non HMO) | Admitting: Internal Medicine

## 2012-08-20 ENCOUNTER — Encounter: Payer: Self-pay | Admitting: Internal Medicine

## 2012-08-20 VITALS — BP 118/62 | HR 72 | Temp 98.1°F | Resp 10 | Ht 63.0 in | Wt 150.0 lb

## 2012-08-20 DIAGNOSIS — E039 Hypothyroidism, unspecified: Secondary | ICD-10-CM

## 2012-08-20 LAB — T4, FREE: Free T4: 0.86 ng/dL (ref 0.60–1.60)

## 2012-08-20 NOTE — Patient Instructions (Addendum)
Please join MyChart - I will send you the results through there. If TSH is higher, I would suggest first increasing the dose of Levothyroxine to see how you feel; but if TSH the same or lower, we can switch to Armour thyroid. Please return in 3 months for recheck.

## 2012-08-20 NOTE — Progress Notes (Signed)
Patient ID: Kaitlyn Solomon, female   DOB: 08-09-1965, 47 y.o.   MRN: [id number]   HPI  Kaitlyn Solomon is a 47 y.o.-year-old female, referred by her PCP, Dr.Aron, for evaluation for management of hypothyroidism.  She has h/o hypothyroidism dx in 2012, and thyroid tests have been normal after dx. At her last check, TSH has been at the upper limit of normal: 5.48, with a normal free T4 value: Lab Results  Component Value Date   TSH 5.48 04/01/2012   TSH 1.53 12/04/2010   TSH 1.56 06/08/2010   TSH 7.703* 04/21/2010   FREET4 0.87 04/01/2012   FREET4 0.92 12/04/2010  She is on Levothyroxine 75 mcg (generic) no recent dose changes. She takes this in am, fasting, with water, separated by >30-60 min from b'fast and from other meds. She was on iron off and on for years - stopped 4-5 mo ago and switched to MVI. She takes the MVI (and took the iron) at bedtime.   No dysphagia, odynophagia, hoarseness, lumps in neck or SOB with lying down.   She c/o: - fatigue, sleepiness - always hungry, thirsty - hair thin, falling out >> needs to wear extensions - dry skin - constipation - + anxiety - on Xanax as needed, but did not need it in last years/depression - no tremors - had palpitations in the past, now seldom - no CP/SOB  - had partial hysterectomy 4 years ago, but ovaries left in - recent hot flushes, but motly feels cold - weight gain: gained ~15-20 lbs in last 2 years - she has frequent yeast infections >> burning with urination, took 1 diflucan last week - always had HAs >> but now much better after started HCTZ  Pt does not have a FH of thyroid ds or thyroid cancer. No h/o radiation tx to head or neck.  I reviewed her chart and she also has a history of anemia (prev. On iron tx), HTN (on HCTZ), bipolar disorder, IBS, vit D def (prev. On Ergocalciferol, now on OTC vit D3), yeast vaginitis (on diflucan prn).  ROS: -please see HPI Constitutional: + weight gain/loss, + fatigue, + subjective  hyperthermia and hypothermia; excessive urination and burning with urination - improved Eyes: no blurry vision anymore after tx started for HTN, no xerophthalmia ENT: no sore throat, no nodules palpated in throat, no dysphagia/odynophagia, no hoarseness Cardiovascular: no CP/SOB/+ occas. Palpitations/no leg swelling Respiratory: no cough/SOB Gastrointestinal: no N/V/D/+ C Musculoskeletal: + both muscle/joint aches Skin: no rashes; + hair loss, + itching, + easy bruising Neurological: no tremors/numbness/tingling/dizziness, + occas. HA Psychiatric: no depression/+ anxiety related to her son still living at home  Past Medical History  Diagnosis Date  . Depressive disorder, not elsewhere classified   . Irritable bowel syndrome   . Unspecified essential hypertension   . Headache(784.0)   . Anxiety state, unspecified   . Migraine, unspecified, without mention of intractable migraine without mention of status migrainosus   . Anemia, unspecified   . Bipolar disorder   . Fibroid tumor    Past Surgical History  Procedure Laterality Date  . Abdominal hysterectomy  08/2008  . Dilation and curettage of uterus     History   Social History  . Marital Status: Legally Separated    Spouse Name: N/A    Number of Children: 4: 30, 26, 23, 21  . 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  Smoking status: Former Games developer  . Smokeless tobacco: Not on file  . Alcohol Use: Socially: beer/wine - rarely  . Drug Use: No   Social History Narrative   Regular exercise: walks a lot at work   Caffeine use: yes   Current Outpatient Prescriptions on File Prior to Visit  Medication Sig Dispense Refill  . ALPRAZolam (XANAX) 0.5 MG tablet Take 1 tablet (0.5 mg total) by mouth as needed. For anxiety  30 tablet  0  . ergocalciferol (VITAMIN D2) 50000 UNITS capsule Take 1 capsule (50,000 Units total) by mouth once a  week.  6 capsule  0  . ferrous sulfate 325 (65 FE) MG tablet Take 325 mg by mouth daily.        . hydrochlorothiazide (HYDRODIURIL) 25 MG tablet Take 1 tablet (25 mg total) by mouth daily.  90 tablet  1  . levothyroxine (SYNTHROID, LEVOTHROID) 75 MCG tablet Take 1 tablet (75 mcg total) by mouth daily.  30 tablet  5  . fluconazole (DIFLUCAN) 150 MG tablet Take 1 tablet (150 mg total) by mouth once.  1 tablet  0   Current Facility-Administered Medications on File Prior to Visit  Medication Dose Route Frequency Provider Last Rate Last Dose  . hepatitis B vac recombinant (RECOMBIVAX) injection 5 mcg  0.5 mL Intramuscular Once Dianne Dun, MD      . hepatitis B vac recombinant (RECOMBIVAX) injection 5 mcg  0.5 mL Intramuscular Once Dianne Dun, MD       Allergies  Allergen Reactions  . Codeine     REACTION: rash  . Lamotrigine Other (See Comments)    tremors    Family History  Problem Relation Age of Onset  . Cancer Mother     breast  . Diabetes Brother   . Cancer Paternal Aunt     breast  . Diabetes Paternal Aunt   . Cancer Maternal Grandmother     breast  . Diabetes Maternal Grandmother    no family history of early heart disease  PE: BP 118/62  Pulse 72  Temp(Src) 98.1 F (36.7 C) (Oral)  Resp 10  Ht 5\' 3"  (1.6 m)  Wt 150 lb (68.04 kg)  BMI 26.58 kg/m2  SpO2 98% Wt Readings from Last 3 Encounters:  08/20/12 150 lb (68.04 kg)  05/20/12 154 lb 9.6 oz (70.126 kg)  04/01/12 150 lb 8 oz (68.266 kg)   Constitutional: normal weight, in NAD Eyes: PERRLA, EOMI, no exophthalmos ENT: moist mucous membranes, no thyromegaly, no cervical lymphadenopathy Cardiovascular: RRR, No MRG Respiratory: CTA B Gastrointestinal: abdomen soft, NT, ND, BS+ Musculoskeletal: no deformities, strength intact in all 4 Skin: moist, warm, no rashes Neurological: no tremor with outstretched hands, DTR normal in all 4  ASSESSMENT: 1. Hypothyroidism - well controlled - on generic Levothyroxine  75 mcg daily  PLAN:  1. I discussed with the patient that her symptoms appear nonspecific for hypothyroidism, and it is possible that they are caused by going through menopause. Insomnia and anxiety/depression can also cause physical symptoms like fatigue and weight gain. She tells me that a friend has switched from levothyroxine to Armour thyroid and she feels much better now. She would like to try this, too. - We discussed about positive and negative aspects of using Armour thyroid. I underlined the fact that:  Armour is purified from porcine thyroid glands, which is not without risk for contaminants  Also, the ratio between T4 and T3 in Armour is physiologic for pigs, not  for humans.   The short half life of T3 can cause fluctuations in blood levels, which can result in mood swings and heart rhythm abnormalities.  The concentration of the active substances (T4 and T3)  can be expected to vary between different Armour lots, which can cause variation in the thyroid function tests.  - I suggested that we check her TSH and free T4 today, and, if abnormal, I would suggest increasing the dose of levothyroxine first and see how she feels, however, if normal, we can switch to the corresponding dose of Armour thyroid. In this case, we likely need to start her on half of a 1 and 1/2 grain tablet, which which is the equivalent of 75 mcg of levothyroxine  - I suggested that she returns in 3 months, to evaluate her if we need to change to Armour, however, if we decide not to change to Armour, I can see her in 6 months to yearly basis - I advised the patient to join my chart, and will send her the results through there  Office Visit on 08/20/2012  Component Date Value Range Status  . TSH 08/20/2012 4.29  0.35 - 5.50 uIU/mL Final  . Free T4 08/20/2012 0.86  0.60 - 1.60 ng/dL Final   Will call Armour in - return for labs in 6-8 weeks.

## 2012-08-22 MED ORDER — THYROID 90 MG PO TABS: ORAL_TABLET | ORAL | Status: DC

## 2012-09-02 ENCOUNTER — Encounter: Payer: Self-pay | Admitting: Internal Medicine

## 2012-10-07 ENCOUNTER — Other Ambulatory Visit: Payer: Self-pay

## 2012-10-07 ENCOUNTER — Other Ambulatory Visit: Payer: Self-pay | Admitting: Family Medicine

## 2012-10-07 MED ORDER — HYDROCHLOROTHIAZIDE 25 MG PO TABS: 25.0000 mg | ORAL_TABLET | Freq: Every day | ORAL | Status: DC

## 2012-10-07 MED ORDER — ALPRAZOLAM 0.5 MG PO TABS: 0.5000 mg | ORAL_TABLET | ORAL | Status: DC | PRN

## 2012-10-07 NOTE — Telephone Encounter (Signed)
Refill called to [facility]. 

## 2012-10-07 NOTE — Telephone Encounter (Signed)
Pt left v/m requesting refill alprazolam to [facility] [address].Please advise.

## 2012-10-07 NOTE — Telephone Encounter (Signed)
Pt request refill HCTZ to [facility] [address]. Pt advised done.

## 2012-10-08 ENCOUNTER — Telehealth: Payer: Self-pay | Admitting: *Deleted

## 2012-10-08 NOTE — Telephone Encounter (Signed)
Refill for 30 alprazolam was called to [facility] [address] yesterday.  They called back and said that pt usually gets 90 because she takes 3 a day.  There are 2 sets of directions in her chart, one three times a day, and one a day as needed.  How should pt be taking this.  I tried calling her but go voice mail.  Please advise.

## 2012-10-08 NOTE — Telephone Encounter (Signed)
Agree with checking with pt.  Let me know when she calls back.  If she does take it three times daily, ok to increase quantity.

## 2012-10-08 NOTE — Telephone Encounter (Signed)
Left message on voice mail asking patient to call back. 

## 2012-10-09 NOTE — Telephone Encounter (Signed)
Unable to reach patient, continue to get voice mail. Will leave refill the same for now.

## 2012-10-14 ENCOUNTER — Other Ambulatory Visit: Payer: Self-pay | Admitting: Family Medicine

## 2012-10-14 MED ORDER — ALPRAZOLAM 0.5 MG PO TABS: 0.5000 mg | ORAL_TABLET | ORAL | Status: DC | PRN

## 2012-10-14 NOTE — Telephone Encounter (Signed)
Last filled 06/12/2012. 

## 2012-10-14 NOTE — Telephone Encounter (Signed)
We have gotten another refill request for alprazolam, to take one three times a day.  I am unable to reach patient, always get voice mail, I have left messages asking patient to call back.

## 2012-10-15 NOTE — Telephone Encounter (Signed)
Pt called for status of refill;Medication phoned to [facility] [address] [facility] as instructed.pt notified done.

## 2012-11-19 ENCOUNTER — Ambulatory Visit: Payer: Managed Care, Other (non HMO) | Admitting: Oncology

## 2012-11-19 ENCOUNTER — Other Ambulatory Visit: Payer: Managed Care, Other (non HMO) | Admitting: Lab

## 2012-12-18 ENCOUNTER — Other Ambulatory Visit: Payer: Self-pay

## 2013-01-14 ENCOUNTER — Encounter: Payer: Self-pay | Admitting: Internal Medicine

## 2013-01-14 ENCOUNTER — Ambulatory Visit (INDEPENDENT_AMBULATORY_CARE_PROVIDER_SITE_OTHER): Payer: Managed Care, Other (non HMO) | Admitting: Internal Medicine

## 2013-01-14 VITALS — BP 112/68 | HR 64 | Temp 98.2°F | Resp 10 | Wt 155.0 lb

## 2013-01-14 DIAGNOSIS — B9689 Other specified bacterial agents as the cause of diseases classified elsewhere: Secondary | ICD-10-CM | POA: Insufficient documentation

## 2013-01-14 DIAGNOSIS — A499 Bacterial infection, unspecified: Secondary | ICD-10-CM

## 2013-01-14 DIAGNOSIS — E039 Hypothyroidism, unspecified: Secondary | ICD-10-CM

## 2013-01-14 DIAGNOSIS — N760 Acute vaginitis: Secondary | ICD-10-CM

## 2013-01-14 LAB — TSH: TSH: 1.61 u[IU]/mL (ref 0.35–5.50)

## 2013-01-14 LAB — T4, FREE: Free T4: 0.71 ng/dL (ref 0.60–1.60)

## 2013-01-14 MED ORDER — METRONIDAZOLE 500 MG PO TABS: 500.0000 mg | ORAL_TABLET | Freq: Two times a day (BID) | ORAL | Status: DC

## 2013-01-14 NOTE — Patient Instructions (Signed)
Please return in 1 year. We will need labs sooner, but i will let you know how soon when I get the results back. I will call in Armour when results are back. I called in Flagyl.

## 2013-01-14 NOTE — Progress Notes (Signed)
Patient ID: Kaitlyn Solomon, female   DOB: [birth year], 47 y.o.   MRN: [id number]   HPI  Kaitlyn Solomon is a 47 y.o.-year-old female, returning for f/u for hypothyroidism, dx 2012. Last visit 5 mo ago.  Reviewed TFTs:  Lab Results  Component Value Date   TSH 4.29 08/20/2012   TSH 5.48 04/01/2012   TSH 1.53 12/04/2010   TSH 1.56 06/08/2010   TSH 7.703* 04/21/2010   FREET4 0.86 08/20/2012   FREET4 0.87 04/01/2012   FREET4 0.92 12/04/2010   She was on Levothyroxine 75 mcg (generic) but switched to Armour 1/2 of the 90 mg tablet at last visit, due to persisting hypothyroid sxs with normal TFTs.  Compared to last time, she feels much better: - fatigue, sleepiness >> improved a lot  - always hungry, thirsty >> still hungry - hair thin, falling out >> improved - dry skin >> ?  - constipation >> imroved - + anxiety >> improved; more depression now around the Holidays - no tremors - had palpitations in the past, now seldom >> still occasionally - no CP/SOB  - recent hot flushes >> better - weight gain: gained ~15-20 lbs in last 2 years >> initially lost weight to 141 lbs, now gained back to 155 lbs  No dysphagia, odynophagia, hoarseness, lumps in neck or SOB with lying down.   She takes thyroid med correctly, in am, fasting, with water, separated by >30-60 min from b'fast and from other meds. She was on iron off and on for years - now on MVI. She takes the MVI (and took the iron) at bedtime.   Pt has a history of anemia (prev. On iron tx), HTN (on HCTZ), bipolar disorder, IBS, vit D def (prev. On Ergocalciferol, now on OTC vit D3), yeast vaginitis (on diflucan prn).  I reviewed pt's medications, allergies, PMH, social hx, family hx and no changes required, except as mentioned above.  ROS: -please see HPI Constitutional: no weight gain/loss, no fatigue, no subjective hyperthermia/hypothermia Eyes: no blurry vision, no xerophthalmia ENT: no sore throat, no nodules palpated in throat, no  dysphagia/odynophagia, no hoarseness Cardiovascular: no CP/SOB/palpitations/leg swelling Respiratory: no cough/SOB Gastrointestinal: no N/V/D/C Musculoskeletal: no muscle/joint aches Skin: no rashes Neurological: no tremors/numbness/tingling/dizziness Psychiatric: + depression (around Holidays)/no anxiety  PE: BP 112/68  Pulse 64  Temp(Src) 98.2 F (36.8 C) (Oral)  Resp 10  Wt 155 lb (70.308 kg)  SpO2 99% Body mass index is 27.46 kg/(m^2).  Wt Readings from Last 3 Encounters:  01/14/13 155 lb (70.308 kg)  08/20/12 150 lb (68.04 kg)  05/20/12 154 lb 9.6 oz (70.126 kg)   Constitutional: normal weight, in NAD Eyes: PERRLA, EOMI, no exophthalmos ENT: moist mucous membranes, no thyromegaly, no cervical lymphadenopathy Cardiovascular: RRR, No MRG Respiratory: CTA B Gastrointestinal: abdomen soft, NT, ND, BS+ Musculoskeletal: no deformities, strength intact in all 4 Skin: moist, warm, no rashes Neurological: no tremor with outstretched hands, DTR normal in all 4  ASSESSMENT: 1. Hypothyroidism - well controlled - previously on generic Levothyroxine 75 mcg daily, now on Armour 45 mg   2. BV - vaginal discharge - would like me to call in Flagyl  PLAN:  1. Pt with h/o hypothyroidism, feeling much better after switching to Armour Thyroid. She appears euthyroid. - check TFTs today - needs refill of Armour - will call in when labs back - RTC in 1 year, but sooner for labs - if tests are normal now, lab visit in 6 mo  2. BV -  had a BV infection in 10/2012 >> Flagyl worked, but now recurred - would like me to call in Flagyl again (done)  Office Visit on 01/14/2013  Component Date Value Range Status  . TSH 01/14/2013 1.61  0.35 - 5.50 uIU/mL Final  . Free T4 01/14/2013 0.71  0.60 - 1.60 ng/dL Final  . T3, Free 36/64/4034 3.5  2.3 - 4.2 pg/mL Final   Labs great >> continue Armour at the current dose.

## 2013-01-15 ENCOUNTER — Other Ambulatory Visit: Payer: Self-pay | Admitting: Internal Medicine

## 2013-01-15 DIAGNOSIS — E039 Hypothyroidism, unspecified: Secondary | ICD-10-CM

## 2013-01-15 MED ORDER — THYROID 90 MG PO TABS: ORAL_TABLET | ORAL | Status: DC

## 2013-01-15 NOTE — Addendum Note (Signed)
Addended by: Carlus Pavlov on: 01/15/2013 06:13 AM   Modules accepted: Orders

## 2013-01-27 ENCOUNTER — Other Ambulatory Visit: Payer: Self-pay | Admitting: *Deleted

## 2013-01-27 MED ORDER — ALPRAZOLAM 0.5 MG PO TABS: 0.5000 mg | ORAL_TABLET | ORAL | Status: DC | PRN

## 2013-01-27 NOTE — Telephone Encounter (Signed)
Ok to phone in xanax 

## 2013-01-27 NOTE — Telephone Encounter (Signed)
Rx called in to pharmacy. 

## 2013-01-27 NOTE — Telephone Encounter (Signed)
Pharmacy last filled on 10/15/12.

## 2013-01-28 MED ORDER — ALPRAZOLAM 0.5 MG PO TABS: 0.5000 mg | ORAL_TABLET | Freq: Every day | ORAL | Status: DC | PRN

## 2013-01-28 NOTE — Addendum Note (Signed)
Addended by: Sueanne Margarita on: 01/28/2013 05:05 PM   Modules accepted: Orders

## 2013-01-28 NOTE — Telephone Encounter (Signed)
Corrected rx called into pharmacy

## 2013-03-19 ENCOUNTER — Telehealth: Payer: Self-pay

## 2013-03-19 ENCOUNTER — Encounter: Payer: Self-pay | Admitting: *Deleted

## 2013-03-19 NOTE — Telephone Encounter (Signed)
Ok to write letter as pt requests and use my signature stamp.

## 2013-03-19 NOTE — Telephone Encounter (Signed)
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 Pt request cb when letter is ready for pick up.

## 2013-03-19 NOTE — Telephone Encounter (Signed)
Spoke to pt and informed her letter is ready and available for pickup from the front desk.

## 2013-06-02 ENCOUNTER — Other Ambulatory Visit: Payer: Self-pay | Admitting: *Deleted

## 2013-06-02 MED ORDER — HYDROCHLOROTHIAZIDE 25 MG PO TABS: 25.0000 mg | ORAL_TABLET | Freq: Every day | ORAL | Status: DC

## 2013-06-17 ENCOUNTER — Other Ambulatory Visit: Payer: Self-pay | Admitting: *Deleted

## 2013-06-17 MED ORDER — ALPRAZOLAM 0.5 MG PO TABS: 0.5000 mg | ORAL_TABLET | Freq: Every day | ORAL | Status: DC | PRN

## 2013-06-17 NOTE — Telephone Encounter (Signed)
Rx called in to pharmacy. 

## 2013-06-17 NOTE — Telephone Encounter (Signed)
#  30 last filled on 01/28/13, last ov was in 2/14, no f/u appt scheduled.

## 2013-09-08 ENCOUNTER — Ambulatory Visit (INDEPENDENT_AMBULATORY_CARE_PROVIDER_SITE_OTHER): Payer: Self-pay | Admitting: Family Medicine

## 2013-09-08 ENCOUNTER — Encounter: Payer: Self-pay | Admitting: Family Medicine

## 2013-09-08 ENCOUNTER — Encounter: Payer: Self-pay | Admitting: *Deleted

## 2013-09-08 VITALS — BP 140/90 | HR 57 | Temp 98.1°F | Ht 62.0 in | Wt 155.5 lb

## 2013-09-08 DIAGNOSIS — R0789 Other chest pain: Secondary | ICD-10-CM

## 2013-09-08 DIAGNOSIS — R079 Chest pain, unspecified: Secondary | ICD-10-CM | POA: Insufficient documentation

## 2013-09-08 DIAGNOSIS — I10 Essential (primary) hypertension: Secondary | ICD-10-CM

## 2013-09-08 DIAGNOSIS — Z8659 Personal history of other mental and behavioral disorders: Secondary | ICD-10-CM

## 2013-09-08 DIAGNOSIS — E039 Hypothyroidism, unspecified: Secondary | ICD-10-CM

## 2013-09-08 DIAGNOSIS — F411 Generalized anxiety disorder: Secondary | ICD-10-CM

## 2013-09-08 LAB — T4, FREE: Free T4: 0.83 ng/dL (ref 0.60–1.60)

## 2013-09-08 LAB — T3, FREE: T3, Free: 4.1 pg/mL (ref 2.3–4.2)

## 2013-09-08 LAB — TSH: TSH: 2.55 u[IU]/mL (ref 0.35–4.50)

## 2013-09-08 MED ORDER — CLONAZEPAM 0.5 MG PO TABS: 0.5000 mg | ORAL_TABLET | Freq: Every day | ORAL | Status: DC | PRN

## 2013-09-08 MED ORDER — HYDROCHLOROTHIAZIDE 25 MG PO TABS: 25.0000 mg | ORAL_TABLET | Freq: Every day | ORAL | Status: DC

## 2013-09-08 NOTE — Assessment & Plan Note (Signed)
Unlikely cardiac. Work up neg. Likely due to anxiety. See above.

## 2013-09-08 NOTE — Progress Notes (Signed)
Subjective:   Patient ID: Kaitlyn Solomon, female    DOB: 12-02-1965, 48 y.o.   MRN: [id number]  Kaitlyn Solomon is a pleasant 48 y.o. year old female who presents to clinic today with Hospitalization Follow-up  on 09/08/2013  HPI: Here for hospital follow up.  I have not seen her since 03/2012.  Has been to [facility] at least 3 times.  We have notes from 09/03/13- notes reviewed. Presented to ER for headache, chest pain and left arm numbness. EKG- sinus bradycardia, otherwise unremarkable Hemoglobin 12/7, hematocrit 39.9 UA neg CMET wnl Trop neg UDS neg CXR neg Head CT neg (from previous ER visit- 08/27/13).  BP was elevated 212/110- D/c'd home on HCTZ 25 mg daily and Metoprolol XL 25 mg daily. Was on HCTZ 25 mg daily but had been lost to follow up and was not taking her medications.  Still having daily headaches.  + blurred vision.  No recurrent CP or SOB. BP Readings from Last 3 Encounters:  09/08/13 140/90  01/14/13 112/68  08/20/12 118/62   Hypothyroidism- on armour 90 mg daily- followed by Dr. Cruzita Lederer- last saw her on 01/14/13- note reviewed.  Felt she was euthyroid on armour.  Advised follow up in 6 months (she is overdue for these labs).  Lab Results  Component Value Date   TSH 1.61 01/14/2013   T4TOTAL 6.1 06/17/2009   Depression/bipolar disorder- diagnosed in 2004 after she had a manic episode. No longer taking Lamictal or Effexor-- off all psych meds for past 2 years.   She has been under more stressors.  Moved to [place] last month because she lost home in [place].  Stayed with family and could not find a job.  Moved back here recently. She is no longer seeing Dr. Achilles Dunk.  Denies any manic symptoms, anxiety or depression. No SI or HI. Current Outpatient Prescriptions on File Prior to Visit  Medication Sig Dispense Refill  . ergocalciferol (VITAMIN D2) 50000 UNITS capsule Take 1 capsule (50,000 Units total) by mouth once a week.  6 capsule  0  .  hydrochlorothiazide (HYDRODIURIL) 25 MG tablet Take 1 tablet (25 mg total) by mouth daily.  30 tablet  0  . Multiple Vitamin (MULTIVITAMIN) tablet Take 1 tablet by mouth daily.      Marland Kitchen thyroid (ARMOUR THYROID) 90 MG tablet Take by mouth 1/2 tablet in am, fasting, daily  45 tablet  3   Current Facility-Administered Medications on File Prior to Visit  Medication Dose Route Frequency Provider Last Rate Last Dose  . hepatitis B vac recombinant (RECOMBIVAX) injection 5 mcg  0.5 mL Intramuscular Once Lucille Passy, MD      . hepatitis B vac recombinant (RECOMBIVAX) injection 5 mcg  0.5 mL Intramuscular Once Lucille Passy, MD        Allergies  Allergen Reactions  . Codeine     REACTION: rash  . Lamotrigine Other (See Comments)    tremors     Past Medical History  Diagnosis Date  . Depressive disorder, not elsewhere classified   . Irritable bowel syndrome   . Unspecified essential hypertension   . Headache(784.0)   . Anxiety state, unspecified   . Migraine, unspecified, without mention of intractable migraine without mention of status migrainosus   . Anemia, unspecified   . Bipolar disorder   . Fibroid tumor     Past Surgical History  Procedure Laterality Date  . Abdominal hysterectomy  08/2008  . Dilation and curettage of  uterus      Family History  Problem Relation Age of Onset  . Cancer Mother     breast  . Diabetes Brother   . Cancer Paternal Aunt     breast  . Diabetes Paternal Aunt   . Cancer Maternal Grandmother     breast  . Diabetes Maternal Grandmother     History   Social History  . Marital Status: Legally Separated    Spouse Name: N/A    Number of Children: N/A  . 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 Smoking status: Former Research scientist (life sciences)  . Smokeless tobacco: Not on file  . Alcohol Use: No  . Drug Use: No  . Sexual Activity:    Other Topics Concern  . Not on file   Social History Narrative    Regular exercise: walks a lot at work   Caffeine use: yes   The PMH, PSH, Social History, Family History, Medications, and allergies have been reviewed in [facility], and have been updated if relevant.   Review of Systems See HPI +insomnia No tremor No changes in bowel habits     Objective:    BP 140/90  Pulse 57  Temp(Src) 98.1 F (36.7 C) (Oral)  Ht 5\' 2"  (1.575 m)  Wt 155 lb 8 oz (70.534 kg)  BMI 28.43 kg/m2  SpO2 97%   Physical Exam  Gen:  Alert, pleasant, NAD HEENT:  No thyromegaly  Neuro:  No tremors Resp:  CTA bilaterally CVS:  RRR Ext:  No edema Psych:  Good eye contact, not anxious or depressed appearing      Assessment & Plan:   BIPOLAR AFFECTIVE DISORDER, MANIC, HX OF  HYPERTENSION  Hypothyroidism, unspecified hypothyroidism type No Follow-up on file.

## 2013-09-08 NOTE — Addendum Note (Signed)
Addended by: Marchia Bond on: 09/08/2013 08:48 AM   Modules accepted: Orders

## 2013-09-08 NOTE — Assessment & Plan Note (Signed)
Improved.  Continue current rx.  Follow up in 2 weeks.

## 2013-09-08 NOTE — Progress Notes (Signed)
Pre visit review using our clinic review tool, if applicable. No additional management support is needed unless otherwise documented below in the visit note. 

## 2013-09-08 NOTE — Assessment & Plan Note (Signed)
Declines referral to psych. Rx given for as needed klonopin to be used sparingly. The patient indicates understanding of these issues and agrees with the plan.

## 2013-09-08 NOTE — Assessment & Plan Note (Signed)
Due for labs today. Continue current dose of synthroid. Will forward labs to Dr. Cruzita Lederer.

## 2013-09-08 NOTE — Patient Instructions (Addendum)
Good to see you.  Take klonopin as needed daily for anxiety.  Come see me in 2 weeks.  I will call you with your lab results.

## 2013-09-22 ENCOUNTER — Ambulatory Visit (INDEPENDENT_AMBULATORY_CARE_PROVIDER_SITE_OTHER): Payer: Self-pay | Admitting: Family Medicine

## 2013-09-22 ENCOUNTER — Encounter: Payer: Self-pay | Admitting: Family Medicine

## 2013-09-22 ENCOUNTER — Ambulatory Visit: Payer: Self-pay | Admitting: Family Medicine

## 2013-09-22 VITALS — BP 132/88 | HR 56 | Temp 98.2°F | Wt 152.1 lb

## 2013-09-22 DIAGNOSIS — I10 Essential (primary) hypertension: Secondary | ICD-10-CM

## 2013-09-22 LAB — COMPREHENSIVE METABOLIC PANEL
ALK PHOS: 38 U/L — AB (ref 39–117)
ALT: 14 U/L (ref 0–35)
AST: 18 U/L (ref 0–37)
Albumin: 4.1 g/dL (ref 3.5–5.2)
BUN: 17 mg/dL (ref 6–23)
CO2: 27 mEq/L (ref 19–32)
Calcium: 9.4 mg/dL (ref 8.4–10.5)
Chloride: 101 mEq/L (ref 96–112)
Creatinine, Ser: 1 mg/dL (ref 0.4–1.2)
GFR: 73.75 mL/min (ref >60.00)
Glucose, Bld: 80 mg/dL (ref 70–99)
Potassium: 4 mEq/L (ref 3.5–5.1)
SODIUM: 137 meq/L (ref 135–145)
TOTAL PROTEIN: 7.5 g/dL (ref 6.0–8.3)
Total Bilirubin: 0.7 mg/dL (ref 0.2–1.2)

## 2013-09-22 MED ORDER — HYDROCHLOROTHIAZIDE 25 MG PO TABS: 25.0000 mg | ORAL_TABLET | Freq: Every day | ORAL | Status: DC

## 2013-09-22 NOTE — Progress Notes (Signed)
Subjective:   Patient ID: Kaitlyn Solomon, female    DOB: [birth year], 48 y.o.   MRN: [id number]  Kaitlyn Solomon is a pleasant 48 y.o. year old female who presents to clinic today with Follow-up  on 09/22/2013  HPI: Two week follow up- see note from 7/28.  Was started on HCTZ 25 mg daily and Metoprolol XL 25 mg daily. Lost to follow up until two weeks ago.  BPs have been better since that time.  She is having leg cramps.  Current Outpatient Prescriptions on File Prior to Visit  Medication Sig Dispense Refill  . clonazePAM (KLONOPIN) 0.5 MG tablet Take 1 tablet (0.5 mg total) by mouth daily as needed for anxiety.  30 tablet  1  . ergocalciferol (VITAMIN D2) 50000 UNITS capsule Take 1 capsule (50,000 Units total) by mouth once a week.  6 capsule  0  . metoprolol succinate (TOPROL-XL) 25 MG 24 hr tablet Take 25 mg by mouth daily.      . Multiple Vitamin (MULTIVITAMIN) tablet Take 1 tablet by mouth daily.      Marland Kitchen thyroid (ARMOUR THYROID) 90 MG tablet Take by mouth 1/2 tablet in am, fasting, daily  45 tablet  3   Current Facility-Administered Medications on File Prior to Visit  Medication Dose Route Frequency Provider Last Rate Last Dose  . hepatitis B vac recombinant (RECOMBIVAX) injection 5 mcg  0.5 mL Intramuscular Once Lucille Passy, MD      . hepatitis B vac recombinant (RECOMBIVAX) injection 5 mcg  0.5 mL Intramuscular Once Lucille Passy, MD        Allergies  Allergen Reactions  . Codeine     REACTION: rash  . Lamotrigine Other (See Comments)    tremors     Past Medical History  Diagnosis Date  . Depressive disorder, not elsewhere classified   . Irritable bowel syndrome   . Unspecified essential hypertension   . Headache(784.0)   . Anxiety state, unspecified   . Migraine, unspecified, without mention of intractable migraine without mention of status migrainosus   . Anemia, unspecified   . Bipolar disorder   . Fibroid tumor     Past Surgical History  Procedure  Laterality Date  . Abdominal hysterectomy  08/2008  . Dilation and curettage of uterus      Family History  Problem Relation Age of Onset  . Cancer Mother     breast  . Diabetes Brother   . Cancer Paternal Aunt     breast  . Diabetes Paternal Aunt   . Cancer Maternal Grandmother     breast  . Diabetes Maternal Grandmother     History   Social History  . Marital Status: Legally Separated    Spouse Name: N/A    Number of Children: N/A  . 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 Smoking status: Former Research scientist (life sciences)  . Smokeless tobacco: Not on file  . Alcohol Use: No  . Drug Use: No  . Sexual Activity:    Other Topics Concern  . Not on file   Social History Narrative   Regular exercise: walks a lot at work   Caffeine use: yes   The PMH, PSH, Social History, Family History, Medications, and allergies have been reviewed in [facility], and have been updated if relevant.     Review of Systems    See HPI No CP or SOB  No LE edema Objective:    BP 132/88  Pulse 56  Temp(Src) 98.2 F (36.8 C) (Oral)  Wt 152 lb 2 oz (69.003 kg)  SpO2 99%   Physical Exam  Nursing note and vitals reviewed. Constitutional: She appears well-developed and well-nourished. No distress.  Cardiovascular: Normal rate and regular rhythm.   Pulmonary/Chest: Effort normal and breath sounds normal.  Skin: Skin is warm and dry.  Psychiatric: She has a normal mood and affect. Her behavior is normal. Thought content normal.          Assessment & Plan:   HYPERTENSION - Plan: Comprehensive metabolic panel No Follow-up on file.

## 2013-09-22 NOTE — Patient Instructions (Signed)
Great to see you. Your blood pressure is great. I will call you with your lab results.

## 2013-09-22 NOTE — Progress Notes (Signed)
Pre visit review using our clinic review tool, if applicable. No additional management support is needed unless otherwise documented below in the visit note. 

## 2013-09-22 NOTE — Assessment & Plan Note (Signed)
Improved. Continue current rx- HCTZ 25 mg daily, Metoprolol 25 mg daily. CMET today. Orders Placed This Encounter  Procedures  . Comprehensive metabolic panel

## 2013-09-23 ENCOUNTER — Encounter: Payer: Self-pay | Admitting: *Deleted

## 2013-10-30 ENCOUNTER — Encounter: Payer: Self-pay | Admitting: Gastroenterology

## 2013-12-18 ENCOUNTER — Telehealth: Payer: Self-pay

## 2013-12-18 NOTE — Telephone Encounter (Signed)
Yes she would unfortunately need an OV.

## 2013-12-18 NOTE — Telephone Encounter (Signed)
Pt called wanting med for bacterial vaginitis; advised pt would need to schedule appt; pt wanted med called in; offered to let pt speak with CAN but pt hung up phone.

## 2013-12-31 ENCOUNTER — Encounter (HOSPITAL_COMMUNITY): Payer: Self-pay | Admitting: *Deleted

## 2013-12-31 ENCOUNTER — Emergency Department (HOSPITAL_COMMUNITY)
Admission: EM | Admit: 2013-12-31 | Discharge: 2013-12-31 | Disposition: A | Discharge status: Home / Self Care | Payer: Self-pay | Attending: Emergency Medicine | Admitting: Emergency Medicine

## 2013-12-31 DIAGNOSIS — Z79899 Other long term (current) drug therapy: Secondary | ICD-10-CM | POA: Insufficient documentation

## 2013-12-31 DIAGNOSIS — Z87891 Personal history of nicotine dependence: Secondary | ICD-10-CM | POA: Insufficient documentation

## 2013-12-31 DIAGNOSIS — R519 Headache, unspecified: Secondary | ICD-10-CM

## 2013-12-31 DIAGNOSIS — I10 Essential (primary) hypertension: Secondary | ICD-10-CM | POA: Insufficient documentation

## 2013-12-31 DIAGNOSIS — F329 Major depressive disorder, single episode, unspecified: Secondary | ICD-10-CM | POA: Insufficient documentation

## 2013-12-31 DIAGNOSIS — F419 Anxiety disorder, unspecified: Secondary | ICD-10-CM | POA: Insufficient documentation

## 2013-12-31 DIAGNOSIS — M542 Cervicalgia: Secondary | ICD-10-CM | POA: Insufficient documentation

## 2013-12-31 DIAGNOSIS — R51 Headache: Secondary | ICD-10-CM

## 2013-12-31 DIAGNOSIS — Z8742 Personal history of other diseases of the female genital tract: Secondary | ICD-10-CM | POA: Insufficient documentation

## 2013-12-31 DIAGNOSIS — Z862 Personal history of diseases of the blood and blood-forming organs and certain disorders involving the immune mechanism: Secondary | ICD-10-CM | POA: Insufficient documentation

## 2013-12-31 DIAGNOSIS — G43909 Migraine, unspecified, not intractable, without status migrainosus: Secondary | ICD-10-CM | POA: Insufficient documentation

## 2013-12-31 DIAGNOSIS — Z8719 Personal history of other diseases of the digestive system: Secondary | ICD-10-CM | POA: Insufficient documentation

## 2013-12-31 LAB — CBC WITH DIFFERENTIAL/PLATELET
BASOS ABS: 0 10*3/uL (ref 0.0–0.1)
Basophils Relative: 0 % (ref 0–1)
EOS PCT: 2 % (ref 0–5)
Eosinophils Absolute: 0.1 10*3/uL (ref 0.0–0.7)
HEMATOCRIT: 36.9 % (ref 36.0–46.0)
Hemoglobin: 11.6 g/dL — ABNORMAL LOW (ref 12.0–15.0)
LYMPHS PCT: 57 % — AB (ref 12–46)
Lymphs Abs: 4.2 10*3/uL — ABNORMAL HIGH (ref 0.7–4.0)
MCH: 21 pg — ABNORMAL LOW (ref 26.0–34.0)
MCHC: 31.4 g/dL (ref 30.0–36.0)
MCV: 66.7 fL — ABNORMAL LOW (ref 78.0–100.0)
MONOS PCT: 6 % (ref 3–12)
Monocytes Absolute: 0.4 10*3/uL (ref 0.1–1.0)
Neutro Abs: 2.6 10*3/uL (ref 1.7–7.7)
Neutrophils Relative %: 35 % — ABNORMAL LOW (ref 43–77)
PLATELETS: 272 10*3/uL (ref 150–400)
RBC: 5.53 MIL/uL — ABNORMAL HIGH (ref 3.87–5.11)
RDW: 16.4 % — AB (ref 11.5–15.5)
WBC: 7.3 10*3/uL (ref 4.0–10.5)

## 2013-12-31 LAB — BASIC METABOLIC PANEL
ANION GAP: 12 (ref 5–15)
BUN: 15 mg/dL (ref 6–23)
CO2: 26 mEq/L (ref 19–32)
Calcium: 9.1 mg/dL (ref 8.4–10.5)
Chloride: 101 mEq/L (ref 96–112)
Creatinine, Ser: 0.93 mg/dL (ref 0.50–1.10)
GFR, EST AFRICAN AMERICAN: 82 mL/min — AB (ref >90)
GFR, EST NON AFRICAN AMERICAN: 70 mL/min — AB (ref >90)
Glucose, Bld: 85 mg/dL (ref 70–99)
Potassium: 3.7 mEq/L (ref 3.7–5.3)
SODIUM: 139 meq/L (ref 137–147)

## 2013-12-31 MED ORDER — DIPHENHYDRAMINE HCL 50 MG/ML IJ SOLN
25.0000 mg | Freq: Once | INTRAMUSCULAR | Status: AC
  Administered 2013-12-31: 25 mg via INTRAVENOUS
  Filled 2013-12-31: qty 1

## 2013-12-31 MED ORDER — METOCLOPRAMIDE HCL 5 MG/ML IJ SOLN
10.0000 mg | INTRAMUSCULAR | Status: AC
  Administered 2013-12-31: 10 mg via INTRAVENOUS
  Filled 2013-12-31: qty 2

## 2013-12-31 MED ORDER — BUTALBITAL-APAP-CAFFEINE 50-325-40 MG PO TABS: 1.0000 | ORAL_TABLET | Freq: Four times a day (QID) | ORAL | Status: AC | PRN

## 2013-12-31 MED ORDER — SODIUM CHLORIDE 0.9 % IV BOLUS (SEPSIS)
1000.0000 mL | Freq: Once | INTRAVENOUS | Status: AC
  Administered 2013-12-31: 1000 mL via INTRAVENOUS

## 2013-12-31 MED ORDER — ONDANSETRON 4 MG PO TBDP
8.0000 mg | ORAL_TABLET | Freq: Once | ORAL | Status: AC
  Administered 2013-12-31: 8 mg via ORAL
  Filled 2013-12-31: qty 2

## 2013-12-31 MED ORDER — ONDANSETRON 4 MG PO TBDP: 4.0000 mg | ORAL_TABLET | Freq: Three times a day (TID) | ORAL | Status: DC | PRN

## 2013-12-31 MED ORDER — DEXAMETHASONE SODIUM PHOSPHATE 10 MG/ML IJ SOLN
10.0000 mg | Freq: Once | INTRAMUSCULAR | Status: AC
  Administered 2013-12-31: 10 mg via INTRAVENOUS
  Filled 2013-12-31: qty 1

## 2013-12-31 NOTE — Discharge Instructions (Signed)
Take the prescribed medication as directed. Follow-up with your primary care physician.  i do recommend seeing neurology at some point-- referral provided. Return to the ED for new or worsening symptoms.

## 2013-12-31 NOTE — ED Notes (Addendum)
Pt states headaches x 3 months that became increasingly painful on Sunday.  States emesis since last week with increased emesis today.  BP 170/105 in triage.  Pt also c/o pain to R side of neck.

## 2013-12-31 NOTE — ED Provider Notes (Signed)
CSN: [id number]     Arrival date & time 12/31/13  1650 History   First MD Initiated Contact with Patient 12/31/13 1844     Chief Complaint  Patient presents with  . Headache  . Nausea     (Consider location/radiation/quality/duration/timing/severity/associated sxs/prior Treatment) Patient is a 48 y.o. female presenting with headaches. The history is provided by the patient and medical records.  Headache   This is a 48 year old female with past medical history significant for bipolar disorder, uterine fibroids, anemia, migraine headaches, depression, presenting to the ED for headache. Patient states she has been battling with headaches for the past several years, worse over the past 3 months. States her current headache started on Sunday and has become increasingly more painful. She endorses associated nausea and vomiting, last emesis earlier this morning. No hematemesis noted. patient also states she has been having some paresthesias of her left arm without numbness or weakness.  States she has had this in the past, usually will resolve on its own.  Patient states some blurred vision but she recently broke her glasses and does not have a replacement pair.  Patient states she has been taking Excedrin Migraine, Advil, and Aleve without improvement of her headache. She states she has had MRI and CT of her head in the past (most recent was 4 months ago), no abnormalities noted.  States her primary care physician did start her on sumatriptan for a brief period, this did not help her headaches either. She is not currently followed by neurology.  Patient's blood pressure elevated on arrival, states over the past week her readings have been higher than normal. She states she has been compliant with her medications.  No recent fever, chills.  States some neck pain on her right side, states she thinks it is stemming from her headache and "tension".  VS stable on arrival.  Past Medical History  Diagnosis Date   . Depressive disorder, not elsewhere classified   . Irritable bowel syndrome   . Unspecified essential hypertension   . Headache(784.0)   . Anxiety state, unspecified   . Migraine, unspecified, without mention of intractable migraine without mention of status migrainosus   . Anemia, unspecified   . Bipolar disorder   . Fibroid tumor    Past Surgical History  Procedure Laterality Date  . Abdominal hysterectomy  08/2008  . Dilation and curettage of uterus     Family History  Problem Relation Age of Onset  . Cancer Mother     breast  . Diabetes Brother   . Cancer Paternal Aunt     breast  . Diabetes Paternal Aunt   . Cancer Maternal Grandmother     breast  . Diabetes Maternal Grandmother    History  Substance Use Topics  . Smoking status: Former Research scientist (life sciences)  . Smokeless tobacco: Not on file  . Alcohol Use: No   OB History    No data available     Review of Systems  Neurological: Positive for headaches.  All other systems reviewed and are negative.     Allergies  Codeine and Lamotrigine  Home Medications   Prior to Admission medications   Medication Sig Start Date End Date Taking? Authorizing Provider  cholecalciferol (VITAMIN D) 1000 UNITS tablet Take 1,000 Units by mouth daily.   Yes Historical Provider, MD  hydrochlorothiazide (HYDRODIURIL) 25 MG tablet Take 1 tablet (25 mg total) by mouth daily. 09/22/13  Yes Lucille Passy, MD  Multiple Vitamin (MULTIVITAMIN) tablet Take 1  tablet by mouth daily.   Yes Historical Provider, MD  thyroid (ARMOUR THYROID) 90 MG tablet Take by mouth 1/2 tablet in am, fasting, daily 01/15/13  Yes Philemon Kingdom, MD  TRAZODONE HCL PO Take 1 tablet by mouth as needed (sleep).   Yes Historical Provider, MD  clonazePAM (KLONOPIN) 0.5 MG tablet Take 1 tablet (0.5 mg total) by mouth daily as needed for anxiety. 09/08/13   Lucille Passy, MD  ergocalciferol (VITAMIN D2) 50000 UNITS capsule Take 1 capsule (50,000 Units total) by mouth once a  week. Patient not taking: Reported on 12/31/2013 04/02/12   Lucille Passy, MD   BP 157/105 mmHg  Pulse 89  Temp(Src) 97.9 F (36.6 C) (Oral)  Resp 11  Ht 5\' 2"  (1.575 m)  Wt 164 lb (74.39 kg)  BMI 29.99 kg/m2  SpO2 100%   Physical Exam  Constitutional: She is oriented to person, place, and time. She appears well-developed and well-nourished.  HENT:  Head: Normocephalic and atraumatic.  Mouth/Throat: Oropharynx is clear and moist.  Eyes: Conjunctivae and EOM are normal. Pupils are equal, round, and reactive to light.  EOMs intact; PERRL  Neck: Normal range of motion and full passive range of motion without pain. Neck supple. Muscular tenderness present. No spinous process tenderness present. No rigidity.  No meningismus, full range of motion of neck including chin to chest, mild tenderness noted along the right SCM and trapezius without deformity or midline cervical spine tenderness  Cardiovascular: Normal rate, regular rhythm and normal heart sounds.   Pulmonary/Chest: Effort normal and breath sounds normal. No respiratory distress. She has no wheezes.  Abdominal: Soft. Bowel sounds are normal. There is no tenderness. There is no guarding.  Musculoskeletal: Normal range of motion.  Neurological: She is alert and oriented to person, place, and time.  AAOx3, answering questions appropriately; equal strength UE and LE bilaterally; normal sensation to light touch intact bilaterally; CN grossly intact; moves all extremities appropriately without ataxia; symmetric finger to nose bilaterally; normal coordination; negative pronator drift; no facial asymmetry  Skin: Skin is warm and dry.  Psychiatric: She has a normal mood and affect.  Nursing note and vitals reviewed.   ED Course  Procedures (including critical care time) Labs Review Labs Reviewed  CBC WITH DIFFERENTIAL - Abnormal; Notable for the following:    RBC 5.53 (*)    Hemoglobin 11.6 (*)    MCV 66.7 (*)    MCH 21.0 (*)    RDW  16.4 (*)    Neutrophils Relative % 35 (*)    Lymphocytes Relative 57 (*)    Lymphs Abs 4.2 (*)    All other components within normal limits  BASIC METABOLIC PANEL - Abnormal; Notable for the following:    GFR calc non Af Amer 70 (*)    GFR calc Af Amer 82 (*)    All other components within normal limits    Imaging Review No results found.   EKG Interpretation None      MDM   Final diagnoses:  Headache, unspecified headache type   48 year old female with chronic headaches over the past year, worse over the past 3 months. Current headache ongoing for the past 4 days. She notes associated paresthesias of her left arm which she has had in the past. She also notes blurred vision but has recently broken her glasses and does not have replacement pair. On exam, patient afebrile and overall nontoxic in appearance. She has no nuchal rigidity, full range of motion  of neck maintained.  Neurologic exam is non-focal.  Low suspicion for intracranial pathology including TIA, stroke, ICH, SAH, or meningitis.  Will plan for migraine cocktail.  Basic labs obtained.  Lab work is reassuring. After migraine cocktail patient states headache has fully resolved. She is currently in her room coloring. Neurologic exam remains nonfocal. Blood pressure has stabilized, no signs of end organ damage. Feel patient stable for discharge. I have encouraged follow-up with her primary care physician as well as neurology.  Short supply fioricet and zofran given should headache recur.  Discussed plan with patient, he/she acknowledged understanding and agreed with plan of care.  Return precautions given for new or worsening symptoms.  Case discussed with attending physician, Dr. Leonides Schanz, who agrees with assessment and plan of care.  Larene Pickett, PA-C 12/31/13 2128  [place], DO 12/31/13 2324

## 2014-01-11 ENCOUNTER — Other Ambulatory Visit: Payer: Self-pay | Admitting: *Deleted

## 2014-01-11 MED ORDER — HYDROCHLOROTHIAZIDE 25 MG PO TABS: 25.0000 mg | ORAL_TABLET | Freq: Every day | ORAL | Status: DC

## 2014-01-11 MED ORDER — CLONAZEPAM 0.5 MG PO TABS: 0.5000 mg | ORAL_TABLET | Freq: Every day | ORAL | Status: DC | PRN

## 2014-01-11 NOTE — Telephone Encounter (Signed)
Rx called in to requested pharmacy 

## 2014-01-11 NOTE — Telephone Encounter (Signed)
Pt requesting medication refill. Last f/u appt 09/2013. pls advise

## 2015-02-12 ENCOUNTER — Emergency Department (HOSPITAL_COMMUNITY): Payer: Self-pay

## 2015-02-12 ENCOUNTER — Emergency Department (HOSPITAL_COMMUNITY)
Admission: EM | Admit: 2015-02-12 | Discharge: 2015-02-13 | Disposition: A | Discharge status: Home / Self Care | Payer: Self-pay | Attending: Emergency Medicine | Admitting: Emergency Medicine

## 2015-02-12 ENCOUNTER — Encounter (HOSPITAL_COMMUNITY): Payer: Self-pay | Admitting: *Deleted

## 2015-02-12 DIAGNOSIS — Y990 Civilian activity done for income or pay: Secondary | ICD-10-CM | POA: Insufficient documentation

## 2015-02-12 DIAGNOSIS — S0083XA Contusion of other part of head, initial encounter: Secondary | ICD-10-CM | POA: Insufficient documentation

## 2015-02-12 DIAGNOSIS — Z79899 Other long term (current) drug therapy: Secondary | ICD-10-CM | POA: Insufficient documentation

## 2015-02-12 DIAGNOSIS — I10 Essential (primary) hypertension: Secondary | ICD-10-CM | POA: Insufficient documentation

## 2015-02-12 DIAGNOSIS — W19XXXA Unspecified fall, initial encounter: Secondary | ICD-10-CM

## 2015-02-12 DIAGNOSIS — Y9389 Activity, other specified: Secondary | ICD-10-CM | POA: Insufficient documentation

## 2015-02-12 DIAGNOSIS — Z8659 Personal history of other mental and behavioral disorders: Secondary | ICD-10-CM | POA: Insufficient documentation

## 2015-02-12 DIAGNOSIS — Z86018 Personal history of other benign neoplasm: Secondary | ICD-10-CM | POA: Insufficient documentation

## 2015-02-12 DIAGNOSIS — W01198A Fall on same level from slipping, tripping and stumbling with subsequent striking against other object, initial encounter: Secondary | ICD-10-CM | POA: Insufficient documentation

## 2015-02-12 DIAGNOSIS — Z87891 Personal history of nicotine dependence: Secondary | ICD-10-CM | POA: Insufficient documentation

## 2015-02-12 DIAGNOSIS — Z8619 Personal history of other infectious and parasitic diseases: Secondary | ICD-10-CM | POA: Insufficient documentation

## 2015-02-12 DIAGNOSIS — Y9289 Other specified places as the place of occurrence of the external cause: Secondary | ICD-10-CM | POA: Insufficient documentation

## 2015-02-12 DIAGNOSIS — S8002XA Contusion of left knee, initial encounter: Secondary | ICD-10-CM | POA: Insufficient documentation

## 2015-02-12 DIAGNOSIS — Z862 Personal history of diseases of the blood and blood-forming organs and certain disorders involving the immune mechanism: Secondary | ICD-10-CM | POA: Insufficient documentation

## 2015-02-12 MED ORDER — HYDROCODONE-ACETAMINOPHEN 5-325 MG PO TABS: 1.0000 | ORAL_TABLET | Freq: Four times a day (QID) | ORAL | Status: DC | PRN

## 2015-02-12 MED ORDER — HYDROCHLOROTHIAZIDE 25 MG PO TABS: 25.0000 mg | ORAL_TABLET | Freq: Every day | ORAL | Status: DC

## 2015-02-12 MED ORDER — ACETAMINOPHEN 325 MG PO TABS
650.0000 mg | ORAL_TABLET | Freq: Once | ORAL | Status: AC
  Administered 2015-02-13: 650 mg via ORAL
  Filled 2015-02-12: qty 2

## 2015-02-12 NOTE — ED Provider Notes (Signed)
CSN: [id number]     Arrival date & time 02/12/15  2048 History   By signing my name below, I, Meriel Pica, attest that this documentation has been prepared under the direction and in the presence of Debroah Baller, NP.  Electronically Signed: Meriel Pica, ED Scribe. 02/12/2015. 10:16 PM.   Chief Complaint  Patient presents with  . Fall   Patient is a 49 y.o. female presenting with fall. The history is provided by the patient. No language interpreter was used.  Fall This is a new problem. The current episode started 1 to 2 hours ago. The problem occurs constantly. The problem has not changed since onset.Associated symptoms include headaches. Pertinent negatives include no chest pain, no abdominal pain and no shortness of breath. Nothing aggravates the symptoms. Nothing relieves the symptoms. She has tried a cold compress for the symptoms. The treatment provided no relief.   HPI Comments: Kaitlyn Solomon is a 49 y.o. female, with a h/o HTN, who presents to the Emergency Department complaining of a sudden onset, constant, mild headache s/p mechanical fall that occurred PTA while at work. Pt reports she tripped over a piece of plastic while at work, fell forward, and hit her head on a metal cage. Pt states she was confused s/p fall, but this symptom has resolved. She has a large hematoma to mid forehead. She applied cold water to the hematoma without relief.Pt is also c/o constant, mild left knee pain that she notes has gradually improved since falling.  Denies LOC, visual disturbances or changes in vision, epistaxis, or bleeding from ears. Pt does not take a daily blood thinning medication.   Pt additionally notes she has been taking Excedrin and aspirin BID over the past week for persistent headaches.   Pt is hypertensive on triage vitals. 178/119 She notes a h/o of HTN but states she is not on medication currently. She admits to increased stress over the holiday season which she attributes to  causing her headache and elevated BP.    Past Medical History  Diagnosis Date  . Depressive disorder, not elsewhere classified   . Irritable bowel syndrome   . Unspecified essential hypertension   . Headache(784.0)   . Anxiety state, unspecified   . Migraine, unspecified, without mention of intractable migraine without mention of status migrainosus   . Anemia, unspecified   . Bipolar disorder ([place])   . Fibroid tumor    Past Surgical History  Procedure Laterality Date  . Abdominal hysterectomy  08/2008  . Dilation and curettage of uterus     Family History  Problem Relation Age of Onset  . Cancer Mother     breast  . Diabetes Brother   . Cancer Paternal Aunt     breast  . Diabetes Paternal Aunt   . Cancer Maternal Grandmother     breast  . Diabetes Maternal Grandmother    Social History  Substance Use Topics  . Smoking status: Former Research scientist (life sciences)  . Smokeless tobacco: None  . Alcohol Use: No   OB History    No data available     Review of Systems  HENT: Negative for ear discharge and nosebleeds.   Eyes: Negative for photophobia and visual disturbance.  Respiratory: Negative for shortness of breath.   Cardiovascular: Negative for chest pain.  Gastrointestinal: Negative for abdominal pain.  Musculoskeletal: Positive for arthralgias ( left knee). Negative for gait problem.  Skin: Positive for color change ( hematoma to mid forehead).  Neurological: Positive for headaches.  Negative for syncope.  Hematological: Does not bruise/bleed easily.  All other systems reviewed and are negative.  Allergies  Codeine and Lamotrigine  Home Medications   Prior to Admission medications   Medication Sig Start Date End Date Taking? Authorizing Provider  cholecalciferol (VITAMIN D) 1000 UNITS tablet Take 1,000 Units by mouth daily.   Yes Historical Provider, MD  Multiple Vitamin (MULTIVITAMIN) tablet Take 1 tablet by mouth daily.   Yes Historical Provider, MD  clonazePAM (KLONOPIN)  0.5 MG tablet Take 1 tablet (0.5 mg total) by mouth daily as needed for anxiety. Patient not taking: Reported on 02/12/2015 01/11/14   Lucille Passy, MD  ergocalciferol (VITAMIN D2) 50000 UNITS capsule Take 1 capsule (50,000 Units total) by mouth once a week. Patient not taking: Reported on 12/31/2013 04/02/12   Lucille Passy, MD  hydrochlorothiazide (HYDRODIURIL) 25 MG tablet Take 1 tablet (25 mg total) by mouth daily. 02/12/15   Danyon Mcginness Bunnie Pion, NP  HYDROcodone-acetaminophen (NORCO) 5-325 MG tablet Take 1 tablet by mouth every 6 (six) hours as needed. 02/12/15   [place]ley [place] Bunnie Pion, NP  ondansetron (ZOFRAN ODT) 4 MG disintegrating tablet Take 1 tablet (4 mg total) by mouth every 8 (eight) hours as needed for nausea. Patient not taking: Reported on 02/12/2015 12/31/13   Larene Pickett, PA-C  thyroid [facility] THYROID) 90 MG tablet Take by mouth 1/2 tablet in am, fasting, daily Patient not taking: Reported on 02/12/2015 01/15/13   Philemon Kingdom, MD   BP 178/119 mmHg  Pulse 76  Temp(Src) 97.6 F (36.4 C) (Oral)  Resp 16  Ht 5\' 2"  (1.575 m)  Wt 149 lb 11.2 oz (67.903 kg)  BMI 27.37 kg/m2  SpO2 100% Physical Exam  Constitutional: She is oriented to person, place, and time. She appears well-developed and well-nourished.  HENT:  Head: Normocephalic.  Right Ear: Tympanic membrane normal.  Left Ear: Tympanic membrane normal.  Tender, raised hematoma to mid forehead. No hemotympanum bilaterally.   Eyes: Conjunctivae and EOM are normal. Pupils are equal, round, and reactive to light.  Neck: Normal range of motion. Neck supple.  FROM of neck.   Cardiovascular: Normal rate, regular rhythm and normal heart sounds.   Pulmonary/Chest: Effort normal and breath sounds normal. No respiratory distress. She has no wheezes.  Abdominal: Soft. There is no tenderness.  Musculoskeletal: Normal range of motion. She exhibits edema and tenderness.  Pedal pulses 2+. Left knee; FROM of left knee, no abnormal patellar  movement, ecchymosis and mild swelling to left knee, left knee TTP.   Neurological: She is alert and oriented to person, place, and time. No cranial nerve deficit. Coordination normal.  Pt ambulatory with steady gait.   Skin: Skin is warm and dry.  Psychiatric: She has a normal mood and affect. Her behavior is normal.  Nursing note and vitals reviewed.   ED Course  Procedures  DIAGNOSTIC STUDIES: Oxygen Saturation is 100% on RA, normal by my interpretation.    COORDINATION OF CARE: 10:10 PM Discussed treatment plan which includes to order Xray of left knee with pt. Pt acknowledges and agrees to plan.  10:18 PM Discussed case with Dr. Kathrynn Humble; due to pt's persistent headaches over the past week, and fall today, will order CT of head. Pt is agreeable to plan.   Imaging Review Ct Head Wo Contrast  02/12/2015  CLINICAL DATA:  Status post fall at work, with forehead scalp hematoma. Initial encounter. EXAM: CT HEAD WITHOUT CONTRAST TECHNIQUE: Contiguous axial images were obtained from  the base of the skull through the vertex without intravenous contrast. COMPARISON:  CT of the head performed 08/27/2013 FINDINGS: There is no evidence of acute infarction, mass lesion, or intra- or extra-axial hemorrhage on CT. Mild periventricular white matter change likely reflects small vessel ischemic microangiopathy. The posterior fossa, including the cerebellum, brainstem and fourth ventricle, is within normal limits. The third and lateral ventricles, and basal ganglia are unremarkable in appearance. The cerebral hemispheres are symmetric in appearance, with normal gray-white differentiation. No mass effect or midline shift is seen. There is no evidence of fracture; visualized osseous structures are unremarkable in appearance. The visualized portions of the orbits are within normal limits. The paranasal sinuses and mastoid air cells are well-aerated. Soft tissue swelling is noted overlying the frontal calvarium.  IMPRESSION: 1. No evidence of traumatic intracranial injury or fracture. 2. Soft tissue swelling overlying the frontal calvarium. 3. Mild small vessel ischemic microangiopathy. Electronically Signed   By: Garald Balding M.D.   On: 02/12/2015 23:34   Dg Knee Complete 4 Views Left  02/12/2015  CLINICAL DATA:  Patient status post fall landing on the knee. Anterior knee patent. EXAM: LEFT KNEE - COMPLETE 4+ VIEW COMPARISON:  The radiograph 01/08/2006. FINDINGS: Normal anatomic alignment. No evidence for acute fracture or dislocation. No joint effusion. Regional soft tissues unremarkable. IMPRESSION: No acute osseous abnormality. Electronically Signed   By: Lovey Newcomer M.D.   On: 02/12/2015 23:10   I have personally reviewed and evaluated these image results as part of my medical decision-making.   MDM  49 y.o. female with swelling and tenderness to the forehead and left knee s/p fall at work tonight. Stable for d/c without neuro deficits. Will treat for hematoma of the forehead and contusion of the left knee. Discussed with the patient elevated BP on arrival and need for follow up with her PCP. She voices understanding and agrees with plan.  Final diagnoses:  Traumatic hematoma of forehead, initial encounter  Contusion of left knee, initial encounter  Fall, initial encounter    Patient X-Ray negative for obvious fracture or dislocation.  Pt advised to follow up with orthopedics. Patient given knee brace while in ED, conservative therapy recommended and discussed. Patient will be discharged home & is agreeable with above plan. Returns precautions discussed. Pt appears safe for discharge.  11:52 PM hypertension improved to 148/82 mmHg. Discussed with Dr. Kathrynn Humble who is in agreement to hold off on administering HCTZ and have the pt follow up with her PCP. Pt is agreeable to plan.   I personally performed the services described in this documentation, which was scribed in my presence. The recorded  information has been reviewed and is accurate.    [place], NP 02/13/15 [id number]  Varney Biles, MD 02/13/15 [phone]

## 2015-02-12 NOTE — Discharge Instructions (Signed)
You may take ibuprofen in addition to the medications we give you. Do not drive while taking the narcotic as it will make you sleepy. Follow up with your doctor to recheck your Blood pressure. Return here for any problems.

## 2015-02-12 NOTE — ED Notes (Signed)
The pt was at work and her foot slipped on a plastic cover causing her to fall  She has pain in her lt knee and a swollen area the middle of her forehead sl headache.  No loc.  lmp none

## 2015-10-20 ENCOUNTER — Encounter: Payer: Self-pay | Admitting: Internal Medicine

## 2015-10-20 ENCOUNTER — Ambulatory Visit (INDEPENDENT_AMBULATORY_CARE_PROVIDER_SITE_OTHER): Payer: PRIVATE HEALTH INSURANCE | Admitting: Internal Medicine

## 2015-10-20 VITALS — BP 140/88 | HR 81 | Temp 98.0°F | Wt 160.0 lb

## 2015-10-20 DIAGNOSIS — N949 Unspecified condition associated with female genital organs and menstrual cycle: Secondary | ICD-10-CM

## 2015-10-20 DIAGNOSIS — F3174 Bipolar disorder, in full remission, most recent episode manic: Secondary | ICD-10-CM | POA: Diagnosis not present

## 2015-10-20 DIAGNOSIS — B373 Candidiasis of vulva and vagina: Secondary | ICD-10-CM

## 2015-10-20 DIAGNOSIS — F411 Generalized anxiety disorder: Secondary | ICD-10-CM | POA: Diagnosis not present

## 2015-10-20 DIAGNOSIS — B3731 Acute candidiasis of vulva and vagina: Secondary | ICD-10-CM

## 2015-10-20 LAB — POC URINALSYSI DIPSTICK (AUTOMATED)
Bilirubin, UA: NEGATIVE
Blood, UA: NEGATIVE
GLUCOSE UA: NEGATIVE
KETONES UA: NEGATIVE
Leukocytes, UA: NEGATIVE
Nitrite, UA: NEGATIVE
PROTEIN UA: NEGATIVE
Spec Grav, UA: 1.025
Urobilinogen, UA: NEGATIVE
pH, UA: 6

## 2015-10-20 MED ORDER — FLUCONAZOLE 150 MG PO TABS: ORAL_TABLET | ORAL | 0 refills | Status: DC

## 2015-10-20 MED ORDER — BUSPIRONE HCL 5 MG PO TABS: 5.0000 mg | ORAL_TABLET | Freq: Two times a day (BID) | ORAL | 0 refills | Status: DC

## 2015-10-20 NOTE — Patient Instructions (Signed)
Generalized Anxiety Disorder Generalized anxiety disorder (GAD) is a mental disorder. It interferes with life functions, including relationships, work, and school. GAD is different from normal anxiety, which everyone experiences at some point in their lives in response to specific life events and activities. Normal anxiety actually helps us prepare for and get through these life events and activities. Normal anxiety goes away after the event or activity is over.  GAD causes anxiety that is not necessarily related to specific events or activities. It also causes excess anxiety in proportion to specific events or activities. The anxiety associated with GAD is also difficult to control. GAD can vary from mild to severe. People with severe GAD can have intense waves of anxiety with physical symptoms (panic attacks).  SYMPTOMS The anxiety and worry associated with GAD are difficult to control. This anxiety and worry are related to many life events and activities and also occur more days than not for 6 months or longer. People with GAD also have three or more of the following symptoms (one or more in children):  Restlessness.   Fatigue.  Difficulty concentrating.   Irritability.  Muscle tension.  Difficulty sleeping or unsatisfying sleep. DIAGNOSIS GAD is diagnosed through an assessment by your health care provider. Your health care provider will ask you questions aboutyour mood,physical symptoms, and events in your life. Your health care provider may ask you about your medical history and use of alcohol or drugs, including prescription medicines. Your health care provider may also do a physical exam and blood tests. Certain medical conditions and the use of certain substances can cause symptoms similar to those associated with GAD. Your health care provider may refer you to a mental health specialist for further evaluation. TREATMENT The following therapies are usually used to treat GAD:    Medication. Antidepressant medication usually is prescribed for long-term daily control. Antianxiety medicines may be added in severe cases, especially when panic attacks occur.   Talk therapy (psychotherapy). Certain types of talk therapy can be helpful in treating GAD by providing support, education, and guidance. A form of talk therapy called cognitive behavioral therapy can teach you healthy ways to think about and react to daily life events and activities.  Stress managementtechniques. These include yoga, meditation, and exercise and can be very helpful when they are practiced regularly. A mental health specialist can help determine which treatment is best for you. Some people see improvement with one therapy. However, other people require a combination of therapies.   This information is not intended to replace advice given to you by your health care provider. Make sure you discuss any questions you have with your health care provider.   Document Released: 05/26/2012 Document Revised: 02/19/2014 Document Reviewed: 05/26/2012 Elsevier Interactive Patient Education 2016 [employer]  

## 2015-10-20 NOTE — Progress Notes (Signed)
Subjective:    Patient ID: Kaitlyn Solomon, female    DOB: 09-25-1965, 50 y.o.   MRN: [id number]  HPI  Pt presents to the clinic today with multiple complaints.  Anxiety: She reports she has had anxiety for years, and diagnosed with bipolar in the past. At one time she was on Effexor and Lithium, but has not been on those meds for a few years now. At her last visit 09/2013, her PCP had given her a RX for Clonazepam to use sparingly. She reports she has had a really tough year. She is constantly worrying. She will feel a flutter in her chest and chest tightness during times of extreme stress, almost like a panic attack. She did follow with a psychiatrist a few years ago, but no one recently.She denies depression, SI/HI.  She also c/o vaginal and anal discomfort. She describes the discomfort as a burning sensation. She denies vaginal discharge or urinary symptoms. She has tried Vagisil OTC without any relief. She reports she has not been sexually active in the last 10 months.   Review of Systems      Past Medical History:  Diagnosis Date  . Anemia, unspecified   . Anxiety state, unspecified   . Bipolar disorder ([place])   . Depressive disorder, not elsewhere classified   . Fibroid tumor   . Headache(784.0)   . Irritable bowel syndrome   . Migraine, unspecified, without mention of intractable migraine without mention of status migrainosus   . Unspecified essential hypertension     Current Outpatient Prescriptions  Medication Sig Dispense Refill  . cholecalciferol (VITAMIN D) 1000 UNITS tablet Take 1,000 Units by mouth daily.    . Multiple Vitamin (MULTIVITAMIN) tablet Take 1 tablet by mouth daily.    . clonazePAM (KLONOPIN) 0.5 MG tablet Take 1 tablet (0.5 mg total) by mouth daily as needed for anxiety. (Patient not taking: Reported on 10/20/2015) 30 tablet 1  . thyroid (ARMOUR THYROID) 90 MG tablet Take by mouth 1/2 tablet in am, fasting, daily (Patient not taking: Reported on 10/20/2015)  45 tablet 3   Current Facility-Administered Medications  Medication Dose Route Frequency Provider Last Rate Last Dose  . hepatitis B vac recombinant (RECOMBIVAX) injection 5 mcg  0.5 mL Intramuscular Once Lucille Passy, MD      . hepatitis B vac recombinant (RECOMBIVAX) injection 5 mcg  0.5 mL Intramuscular Once Lucille Passy, MD        Allergies  Allergen Reactions  . Codeine     REACTION: rash  . Lamotrigine Other (See Comments)    tremors     Family History  Problem Relation Age of Onset  . Cancer Mother     breast  . Diabetes Brother   . Cancer Paternal Aunt     breast  . Diabetes Paternal Aunt   . Cancer Maternal Grandmother     breast  . Diabetes Maternal Grandmother     Social History   Social History  . Marital status: Legally Separated    Spouse name: N/A  . Number of children: N/A  . 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 Smoking status: Former Research scientist (life sciences)  . Smokeless tobacco: Not on file  . Alcohol use No  . Drug use: No  . Sexual activity: Not on file   Other Topics Concern  . Not on file   Social History Narrative   Regular  exercise: walks a lot at work   Caffeine use: yes     Constitutional: Denies fever, malaise, fatigue, headache or abrupt weight changes.  Gastrointestinal: Denies abdominal pain, bloating, constipation, diarrhea or blood in the stool.  GU: Pt reports vaginal and anal discomfort. Denies urgency, frequency, pain with urination, blood in urine, odor or discharge.   No other specific complaints in a complete review of systems (except as listed in HPI above).  Objective:   Physical Exam  BP 140/88   Pulse 81   Temp 98 F (36.7 C) (Oral)   Wt 160 lb (72.6 kg)   SpO2 98%   BMI 29.26 kg/m  Wt Readings from Last 3 Encounters:  10/20/15 160 lb (72.6 kg)  02/12/15 149 lb 11.2 oz (67.9 kg)  12/31/13 164 lb (74.4 kg)    General: Appears her stated age,  well developed, well nourished in NAD. Abdomen: Soft and mildly tender in the LLQ and suprapubic area. Normal bowel sounds. No distention or masses noted. No CVA tenderness noted. Pelvic:  Normal female anatomy. No external rash or lesion noted. No vaginal discharge or odor noted. Neurological: Alert and oriented.  Psychiatric: Mood and affect normal. Behavior is normal. Judgment and thought content normal.     BMET    Component Value Date/Time   NA 139 12/31/2013 1918   NA 140 05/20/2012 1049   K 3.7 12/31/2013 1918   K 3.7 05/20/2012 1049   CL 101 12/31/2013 1918   CL 100 05/20/2012 1049   CO2 26 12/31/2013 1918   CO2 29 05/20/2012 1049   GLUCOSE 85 12/31/2013 1918   GLUCOSE 89 05/20/2012 1049   BUN 15 12/31/2013 1918   BUN 19.5 05/20/2012 1049   CREATININE 0.93 12/31/2013 1918   CREATININE 1.2 (H) 05/20/2012 1049   CALCIUM 9.1 12/31/2013 1918   CALCIUM 9.3 05/20/2012 1049   GFRNONAA 70 (L) 12/31/2013 1918   GFRNONAA >60 01/30/2012 2242   GFRNONAA >60 04/21/2010 1605   GFRAA 82 (L) 12/31/2013 1918   GFRAA >60 01/30/2012 2242   GFRAA >60 04/21/2010 1605    Lipid Panel     Component Value Date/Time   CHOL 187 06/08/2010 0907   TRIG 48.0 06/08/2010 0907   HDL 63.40 06/08/2010 0907   CHOLHDL 3 06/08/2010 0907   VLDL 9.6 06/08/2010 0907   LDLCALC 114 (H) 06/08/2010 0907    CBC    Component Value Date/Time   WBC 7.3 12/31/2013 1918   RBC 5.53 (H) 12/31/2013 1918   HGB 11.6 (L) 12/31/2013 1918   HGB 12.2 05/20/2012 1049   HCT 36.9 12/31/2013 1918   HCT 38.9 05/20/2012 1049   PLT 272 12/31/2013 1918   PLT 261 05/20/2012 1049   MCV 66.7 (L) 12/31/2013 1918   MCV 66.2 (L) 05/20/2012 1049   MCH 21.0 (L) 12/31/2013 1918   MCHC 31.4 12/31/2013 1918   RDW 16.4 (H) 12/31/2013 1918   RDW 16.3 (H) 05/20/2012 1049   LYMPHSABS 4.2 (H) 12/31/2013 1918   LYMPHSABS 3.7 (H) 05/20/2012 1049   MONOABS 0.4 12/31/2013 1918   MONOABS 0.7 05/20/2012 1049   EOSABS 0.1  12/31/2013 1918   EOSABS 0.1 05/20/2012 1049   BASOSABS 0.0 12/31/2013 1918   BASOSABS 0.0 05/20/2012 1049    Hgb A1C No results found for: HGBA1C          Assessment & Plan:   Anxiety:  Discussed treatment options, medication vs referral to psychology for CBT eRx for Buspar 5  mg BID She declines referral for therapy at this time Support offered today  Vaginal candidiasis:  Wet prep: + yeast eRx for Diflucan 150 mg PO x 1, repeat in 3 days if needed  RTC in 1 month to follow up with PCP re: anxiety Webb Silversmith, NP

## 2015-10-20 NOTE — Addendum Note (Signed)
Addended by: Lurlean Nanny on: 10/20/2015 04:58 PM   Modules accepted: Orders

## 2015-11-17 ENCOUNTER — Ambulatory Visit (INDEPENDENT_AMBULATORY_CARE_PROVIDER_SITE_OTHER): Payer: PRIVATE HEALTH INSURANCE | Admitting: Family Medicine

## 2015-11-17 ENCOUNTER — Encounter: Payer: Self-pay | Admitting: Family Medicine

## 2015-11-17 VITALS — BP 144/84 | HR 60 | Temp 98.0°F | Wt 158.0 lb

## 2015-11-17 DIAGNOSIS — F411 Generalized anxiety disorder: Secondary | ICD-10-CM | POA: Diagnosis not present

## 2015-11-17 LAB — COMPREHENSIVE METABOLIC PANEL
ALT: 18 U/L (ref 0–35)
AST: 21 U/L (ref 0–37)
Albumin: 4.2 g/dL (ref 3.5–5.2)
Alkaline Phosphatase: 47 U/L (ref 39–117)
BUN: 15 mg/dL (ref 6–23)
CALCIUM: 9.3 mg/dL (ref 8.4–10.5)
CO2: 29 meq/L (ref 19–32)
CREATININE: 0.88 mg/dL (ref 0.40–1.20)
Chloride: 104 mEq/L (ref 96–112)
GFR: 86.71 mL/min (ref >60.00)
Glucose, Bld: 92 mg/dL (ref 70–99)
Potassium: 4.2 mEq/L (ref 3.5–5.1)
SODIUM: 139 meq/L (ref 135–145)
Total Bilirubin: 0.5 mg/dL (ref 0.2–1.2)
Total Protein: 7.6 g/dL (ref 6.0–8.3)

## 2015-11-17 LAB — HEMOGLOBIN A1C: Hgb A1c MFr Bld: 6.1 % (ref 4.6–6.5)

## 2015-11-17 MED ORDER — BUSPIRONE HCL 15 MG PO TABS: 15.0000 mg | ORAL_TABLET | Freq: Two times a day (BID) | ORAL | 3 refills | Status: DC

## 2015-11-17 MED ORDER — FLUCONAZOLE 150 MG PO TABS: ORAL_TABLET | ORAL | 0 refills | Status: DC

## 2015-11-17 MED ORDER — CLONAZEPAM 0.5 MG PO TABS: 0.5000 mg | ORAL_TABLET | Freq: Every day | ORAL | 1 refills | Status: DC | PRN

## 2015-11-17 NOTE — Progress Notes (Signed)
Subjective:    Patient ID: Kaitlyn Solomon, female    DOB: [birth year], 50 y.o.   MRN: [id number]  HPI  One month follow up.  Saw Regina on 10/20/15 for anxiety.  Note reviewed.  Anxiety: She reports she has had anxiety for years, and diagnosed with bipolar in the past. At one time she was on Effexor and Lithium, but has not been on those meds for a few years now. At her last visit 09/2013, her PCP had given her a RX for Clonazepam to use sparingly. She reports she has had a really tough year. She is constantly worrying. She will feel a flutter in her chest and chest tightness during times of extreme stress, almost like a panic attack. She did follow with a psychiatrist a few years ago, but no one recently.She denies depression, SI/HI.   eRx sent for Buspar 5 mg twice daily Refused referral for CBT.  She feels anxiety is not any better.  Had a panic attack this morning.  Denies feeling depressed.  Asking for refill on benzo which she took a few times a month for severe panic attacks.  Current Outpatient Prescriptions on File Prior to Visit  Medication Sig Dispense Refill  . busPIRone (BUSPAR) 5 MG tablet Take 1 tablet (5 mg total) by mouth 2 (two) times daily. 60 tablet 0  . cholecalciferol (VITAMIN D) 1000 UNITS tablet Take 1,000 Units by mouth daily.    . clonazePAM (KLONOPIN) 0.5 MG tablet Take 1 tablet (0.5 mg total) by mouth daily as needed for anxiety. (Patient not taking: Reported on 10/20/2015) 30 tablet 1  . fluconazole (DIFLUCAN) 150 MG tablet Take 1 tab by mouth now. Repeat in 3 days 2 tablet 0  . Multiple Vitamin (MULTIVITAMIN) tablet Take 1 tablet by mouth daily.    Marland Kitchen thyroid (ARMOUR THYROID) 90 MG tablet Take by mouth 1/2 tablet in am, fasting, daily (Patient not taking: Reported on 10/20/2015) 45 tablet 3   Current Facility-Administered Medications on File Prior to Visit  Medication Dose Route Frequency Provider Last Rate Last Dose  . hepatitis B vac recombinant  (RECOMBIVAX) injection 5 mcg  0.5 mL Intramuscular Once Lucille Passy, MD      . hepatitis B vac recombinant (RECOMBIVAX) injection 5 mcg  0.5 mL Intramuscular Once Lucille Passy, MD        Allergies  Allergen Reactions  . Codeine     REACTION: rash  . Lamotrigine Other (See Comments)    tremors     Past Medical History:  Diagnosis Date  . Anemia, unspecified   . Anxiety state, unspecified   . Bipolar disorder ([place])   . Depressive disorder, not elsewhere classified   . Fibroid tumor   . Headache(784.0)   . Irritable bowel syndrome   . Migraine, unspecified, without mention of intractable migraine without mention of status migrainosus   . Unspecified essential hypertension     Past Surgical History:  Procedure Laterality Date  . ABDOMINAL HYSTERECTOMY  08/2008  . DILATION AND CURETTAGE OF UTERUS      Family History  Problem Relation Age of Onset  . Cancer Mother     breast  . Diabetes Brother   . Cancer Paternal Aunt     breast  . Diabetes Paternal Aunt   . Cancer Maternal Grandmother     breast  . Diabetes Maternal Grandmother     Social History   Social History  . Marital status: Legally Separated  Spouse name: N/A  . Number of children: N/A  . 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 Smoking status: Former Research scientist (life sciences)  . Smokeless tobacco: Not on file  . Alcohol use No  . Drug use: No  . Sexual activity: Not on file   Other Topics Concern  . Not on file   Social History Narrative   Regular exercise: walks a lot at work   Caffeine use: yes   The PMH, PSH, Social History, Family History, Medications, and allergies have been reviewed in [facility], and have been updated if relevant.   Review of Systems  Psychiatric/Behavioral: Negative for self-injury, sleep disturbance and suicidal ideas. The patient is nervous/anxious.   All other systems reviewed and are negative.       Past  Medical History:  Diagnosis Date  . Anemia, unspecified   . Anxiety state, unspecified   . Bipolar disorder ([place])   . Depressive disorder, not elsewhere classified   . Fibroid tumor   . Headache(784.0)   . Irritable bowel syndrome   . Migraine, unspecified, without mention of intractable migraine without mention of status migrainosus   . Unspecified essential hypertension     Current Outpatient Prescriptions  Medication Sig Dispense Refill  . busPIRone (BUSPAR) 5 MG tablet Take 1 tablet (5 mg total) by mouth 2 (two) times daily. 60 tablet 0  . cholecalciferol (VITAMIN D) 1000 UNITS tablet Take 1,000 Units by mouth daily.    . clonazePAM (KLONOPIN) 0.5 MG tablet Take 1 tablet (0.5 mg total) by mouth daily as needed for anxiety. (Patient not taking: Reported on 10/20/2015) 30 tablet 1  . fluconazole (DIFLUCAN) 150 MG tablet Take 1 tab by mouth now. Repeat in 3 days 2 tablet 0  . Multiple Vitamin (MULTIVITAMIN) tablet Take 1 tablet by mouth daily.    Marland Kitchen thyroid (ARMOUR THYROID) 90 MG tablet Take by mouth 1/2 tablet in am, fasting, daily (Patient not taking: Reported on 10/20/2015) 45 tablet 3   Current Facility-Administered Medications  Medication Dose Route Frequency Provider Last Rate Last Dose  . hepatitis B vac recombinant (RECOMBIVAX) injection 5 mcg  0.5 mL Intramuscular Once Lucille Passy, MD      . hepatitis B vac recombinant (RECOMBIVAX) injection 5 mcg  0.5 mL Intramuscular Once Lucille Passy, MD        Allergies  Allergen Reactions  . Codeine     REACTION: rash  . Lamotrigine Other (See Comments)    tremors     Family History  Problem Relation Age of Onset  . Cancer Mother     breast  . Diabetes Brother   . Cancer Paternal Aunt     breast  . Diabetes Paternal Aunt   . Cancer Maternal Grandmother     breast  . Diabetes Maternal Grandmother     Social History   Social History  . Marital status: Legally Separated    Spouse name: N/A  . Number of children: N/A  .  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 Smoking status: Former Research scientist (life sciences)  . Smokeless tobacco: Not on file  . Alcohol use No  . Drug use: No  . Sexual activity: Not on file   Other Topics Concern  . Not on file   Social History Narrative   Regular exercise: walks a lot  at work   Caffeine use: yes     Constitutional: Denies fever, malaise, fatigue, headache or abrupt weight changes.  Gastrointestinal: Denies abdominal pain, bloating, constipation, diarrhea or blood in the stool.  GU: Pt reports vaginal and anal discomfort. Denies urgency, frequency, pain with urination, blood in urine, odor or discharge.   No other specific complaints in a complete review of systems (except as listed in HPI above).  Objective:   Physical Exam  There were no vitals taken for this visit. Wt Readings from Last 3 Encounters:  10/20/15 160 lb (72.6 kg)  02/12/15 149 lb 11.2 oz (67.9 kg)  12/31/13 164 lb (74.4 kg)    General: Appears her stated age, well developed, well nourished in NAD. Abdomen: Soft and mildly tender in the LLQ and suprapubic area. Normal bowel sounds. No distention or masses noted. No CVA tenderness noted. Pelvic:  Normal female anatomy. No external rash or lesion noted. No vaginal discharge or odor noted. Neurological: Alert and oriented.  Psychiatric: Mood and affect normal. Behavior is normal. Judgment and thought content normal.     BMET    Component Value Date/Time   NA 139 12/31/2013 1918   NA 140 05/20/2012 1049   K 3.7 12/31/2013 1918   K 3.7 05/20/2012 1049   CL 101 12/31/2013 1918   CL 100 05/20/2012 1049   CO2 26 12/31/2013 1918   CO2 29 05/20/2012 1049   GLUCOSE 85 12/31/2013 1918   GLUCOSE 89 05/20/2012 1049   BUN 15 12/31/2013 1918   BUN 19.5 05/20/2012 1049   CREATININE 0.93 12/31/2013 1918   CREATININE 1.2 (H) 05/20/2012 1049   CALCIUM 9.1 12/31/2013 1918   CALCIUM 9.3  05/20/2012 1049   GFRNONAA 70 (L) 12/31/2013 1918   GFRNONAA >60 01/30/2012 2242   GFRNONAA >60 04/21/2010 1605   GFRAA 82 (L) 12/31/2013 1918   GFRAA >60 01/30/2012 2242   GFRAA >60 04/21/2010 1605    Lipid Panel     Component Value Date/Time   CHOL 187 06/08/2010 0907   TRIG 48.0 06/08/2010 0907   HDL 63.40 06/08/2010 0907   CHOLHDL 3 06/08/2010 0907   VLDL 9.6 06/08/2010 0907   LDLCALC 114 (H) 06/08/2010 0907    CBC    Component Value Date/Time   WBC 7.3 12/31/2013 1918   RBC 5.53 (H) 12/31/2013 1918   HGB 11.6 (L) 12/31/2013 1918   HGB 12.2 05/20/2012 1049   HCT 36.9 12/31/2013 1918   HCT 38.9 05/20/2012 1049   PLT 272 12/31/2013 1918   PLT 261 05/20/2012 1049   MCV 66.7 (L) 12/31/2013 1918   MCV 66.2 (L) 05/20/2012 1049   MCH 21.0 (L) 12/31/2013 1918   MCHC 31.4 12/31/2013 1918   RDW 16.4 (H) 12/31/2013 1918   RDW 16.3 (H) 05/20/2012 1049   LYMPHSABS 4.2 (H) 12/31/2013 1918   LYMPHSABS 3.7 (H) 05/20/2012 1049   MONOABS 0.4 12/31/2013 1918   MONOABS 0.7 05/20/2012 1049   EOSABS 0.1 12/31/2013 1918   EOSABS 0.1 05/20/2012 1049   BASOSABS 0.0 12/31/2013 1918   BASOSABS 0.0 05/20/2012 1049    Hgb A1C No results found for: HGBA1C          Assessment & Plan:   Anxiety:  Discussed treatment options. >25 minutes spent in face to face time with patient, >50% spent in counselling or coordination of care  Increase buspar 15 mg twice daily. Rx printed for clonazepam to use sparingly and as needed for severe anxiety  Continues to decline referral for therapy at this time

## 2015-11-17 NOTE — Addendum Note (Signed)
Addended by: Modena Nunnery on: 11/17/2015 01:06 PM   Modules accepted: Orders

## 2015-11-17 NOTE — Progress Notes (Signed)
Pre visit review using our clinic review tool, if applicable. No additional management support is needed unless otherwise documented below in the visit note. 

## 2015-11-17 NOTE — Patient Instructions (Signed)
Great to see you. We are increasing your buspar to 15 mg twice daily. Clonazepam as needed sparingly.  Please keep me updated.

## 2015-11-17 NOTE — Addendum Note (Signed)
Addended by: Modena Nunnery on: 11/17/2015 02:32 PM   Modules accepted: Orders

## 2015-11-21 ENCOUNTER — Telehealth: Payer: Self-pay | Admitting: Family Medicine

## 2015-11-21 NOTE — Telephone Encounter (Signed)
Patient returned Kaitlyn Solomon's call. °

## 2015-11-22 NOTE — Telephone Encounter (Signed)
See results note. 

## 2016-04-09 ENCOUNTER — Ambulatory Visit: Payer: PRIVATE HEALTH INSURANCE | Admitting: Family Medicine

## 2016-04-10 ENCOUNTER — Ambulatory Visit (INDEPENDENT_AMBULATORY_CARE_PROVIDER_SITE_OTHER): Payer: PRIVATE HEALTH INSURANCE | Admitting: Family Medicine

## 2016-04-10 VITALS — BP 152/98 | HR 70 | Temp 97.9°F | Wt 164.4 lb

## 2016-04-10 DIAGNOSIS — F411 Generalized anxiety disorder: Secondary | ICD-10-CM

## 2016-04-10 DIAGNOSIS — Z113 Encounter for screening for infections with a predominantly sexual mode of transmission: Secondary | ICD-10-CM | POA: Insufficient documentation

## 2016-04-10 DIAGNOSIS — N760 Acute vaginitis: Secondary | ICD-10-CM | POA: Diagnosis not present

## 2016-04-10 MED ORDER — FLUCONAZOLE 150 MG PO TABS: ORAL_TABLET | ORAL | 0 refills | Status: DC

## 2016-04-10 MED ORDER — CLONAZEPAM 0.5 MG PO TABS: 0.5000 mg | ORAL_TABLET | Freq: Every day | ORAL | 1 refills | Status: DC | PRN

## 2016-04-10 MED ORDER — BUSPIRONE HCL 30 MG PO TABS: 30.0000 mg | ORAL_TABLET | Freq: Two times a day (BID) | ORAL | 3 refills | Status: DC

## 2016-04-10 NOTE — Progress Notes (Signed)
Subjective:   Patient ID: Kaitlyn Solomon, female    DOB: [birth year], 51 y.o.   MRN: [id number]  Kaitlyn Solomon is a pleasant 51 y.o. year old female who presents to clinic today with Std Screening and Vaginitis  on 04/10/2016  HPI:  Anxiety-  When I last saw her on 11/17/15, we had increased her Buspar to 15 mg twice daily with as needed klonipin. Notes reviewed.  She again refused referral for CBT. She feels the Buspar is not strong enough.  Still very anxious.  Wants to be screened for STDs.  In a relationship and has not started having sex yet but wants screening first.  She is having some itchy discharge as well.     Current Outpatient Prescriptions on File Prior to Visit  Medication Sig Dispense Refill  . busPIRone (BUSPAR) 15 MG tablet Take 1 tablet (15 mg total) by mouth 2 (two) times daily. 60 tablet 3  . cholecalciferol (VITAMIN D) 1000 UNITS tablet Take 1,000 Units by mouth daily.    . clonazePAM (KLONOPIN) 0.5 MG tablet Take 1 tablet (0.5 mg total) by mouth daily as needed for anxiety. 30 tablet 1  . Multiple Vitamin (MULTIVITAMIN) tablet Take 1 tablet by mouth daily.    . fluconazole (DIFLUCAN) 150 MG tablet Take 1 tab by mouth now. Repeat in 3 days (Patient not taking: Reported on 04/10/2016) 3 tablet 0  . thyroid (ARMOUR THYROID) 90 MG tablet Take by mouth 1/2 tablet in am, fasting, daily (Patient not taking: Reported on 11/17/2015) 45 tablet 3   Current Facility-Administered Medications on File Prior to Visit  Medication Dose Route Frequency Provider Last Rate Last Dose  . hepatitis B vac recombinant (RECOMBIVAX) injection 5 mcg  0.5 mL Intramuscular Once Lucille Passy, MD        Allergies  Allergen Reactions  . Codeine     REACTION: rash  . Lamotrigine Other (See Comments)    tremors     Past Medical History:  Diagnosis Date  . Anemia, unspecified   . Anxiety state, unspecified   . Bipolar disorder ([place])   . Depressive disorder, not elsewhere  classified   . Fibroid tumor   . Headache(784.0)   . Irritable bowel syndrome   . Migraine, unspecified, without mention of intractable migraine without mention of status migrainosus   . Unspecified essential hypertension     Past Surgical History:  Procedure Laterality Date  . ABDOMINAL HYSTERECTOMY  08/2008  . DILATION AND CURETTAGE OF UTERUS      Family History  Problem Relation Age of Onset  . Cancer Mother     breast  . Diabetes Brother   . Cancer Paternal Aunt     breast  . Diabetes Paternal Aunt   . Cancer Maternal Grandmother     breast  . Diabetes Maternal Grandmother     Social History   Social History  . Marital status: Legally Separated    Spouse name: N/A  . Number of children: N/A  . 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 Smoking status: Former Research scientist (life sciences)  . Smokeless tobacco: Not on file  . Alcohol use No  . Drug use: No  . Sexual activity: Not on file   Other Topics Concern  . Not on file   Social History Narrative   Regular exercise: walks a lot at work   Caffeine use: yes  The PMH, PSH, Social History, Family History, Medications, and allergies have been reviewed in [facility], and have been updated if relevant.   Review of Systems  Constitutional: Negative.   Genitourinary: Positive for vaginal discharge. Negative for decreased urine volume, difficulty urinating, dyspareunia, dysuria, enuresis, flank pain, frequency, genital sores, hematuria, menstrual problem, pelvic pain, urgency and vaginal bleeding.  Psychiatric/Behavioral: Negative for agitation, behavioral problems, confusion, decreased concentration, dysphoric mood, hallucinations, self-injury, sleep disturbance and suicidal ideas. The patient is nervous/anxious. The patient is not hyperactive.        Objective:    BP (!) 152/98 (BP Location: Left Arm, Patient Position: Sitting, Cuff Size: Normal)   Pulse 70   Temp  97.9 F (36.6 C) (Oral)   Wt 164 lb 6.4 oz (74.6 kg)   SpO2 99%   BMI 30.07 kg/m    Physical Exam  Constitutional: She is oriented to person, place, and time. She appears well-developed and well-nourished. No distress.  HENT:  Head: Normocephalic and atraumatic.  Eyes: Conjunctivae are normal.  Cardiovascular: Normal rate.   Pulmonary/Chest: Effort normal.  Genitourinary: No erythema or tenderness in the vagina. Vaginal discharge found.  Lymphadenopathy:       Right: No inguinal adenopathy present.       Left: No inguinal adenopathy present.  Neurological: She is alert and oriented to person, place, and time. No cranial nerve deficit.  Skin: Skin is warm and dry. She is not diaphoretic.  Psychiatric: She has a normal mood and affect. Her behavior is normal. Judgment and thought content normal.  Nursing note and vitals reviewed.         Assessment & Plan:   Anxiety state No Follow-up on file.

## 2016-04-10 NOTE — Progress Notes (Signed)
Pre visit review using our clinic review tool, if applicable. No additional management support is needed unless otherwise documented below in the visit note. 

## 2016-04-10 NOTE — Assessment & Plan Note (Signed)
STD screening done. Orders Placed This Encounter  Procedures  . WET PREP BY MOLECULAR PROBE  . GC/Chlamydia Probe Amp  . HIV antibody (with reflex)  . RPR

## 2016-04-10 NOTE — Assessment & Plan Note (Signed)
New- discharge does seem consistent with vaginal yeast infection. Wet prep done and I have already sent eRx for diflucan to her pharmacy given findings on exam and her h/o recurrent yeast infections. Call or return to clinic prn if these symptoms worsen or fail to improve as anticipated. The patient indicates understanding of these issues and agrees with the plan.

## 2016-04-10 NOTE — Patient Instructions (Signed)
Great to see you. I have increased your Buspar to 30 mg twice daily and sent Diflucan to your pharmacy for a presumed yeast infection.  We will call you with your results from today.

## 2016-04-10 NOTE — Assessment & Plan Note (Signed)
Deteriorated. Continues to defer CBT. Increase buspar to 30 mg twice daily. Klonipin rx refilled today. Call or return to clinic prn if these symptoms worsen or fail to improve as anticipated. The patient indicates understanding of these issues and agrees with the plan.

## 2016-04-11 ENCOUNTER — Other Ambulatory Visit: Payer: Self-pay | Admitting: Family Medicine

## 2016-04-11 ENCOUNTER — Other Ambulatory Visit: Payer: Self-pay

## 2016-04-11 LAB — GC/CHLAMYDIA PROBE AMP
CT Probe RNA: NOT DETECTED
GC Probe RNA: NOT DETECTED

## 2016-04-11 LAB — RPR

## 2016-04-11 LAB — WET PREP BY MOLECULAR PROBE
CANDIDA SPECIES: NOT DETECTED
GARDNERELLA VAGINALIS: DETECTED — AB
TRICHOMONAS VAG: NOT DETECTED

## 2016-04-11 LAB — HIV ANTIBODY (ROUTINE TESTING W REFLEX): HIV: NONREACTIVE

## 2016-04-11 MED ORDER — METRONIDAZOLE 500 MG PO TABS: 500.0000 mg | ORAL_TABLET | Freq: Two times a day (BID) | ORAL | 0 refills | Status: DC

## 2016-04-16 ENCOUNTER — Ambulatory Visit (INDEPENDENT_AMBULATORY_CARE_PROVIDER_SITE_OTHER): Payer: PRIVATE HEALTH INSURANCE | Admitting: Family Medicine

## 2016-04-16 ENCOUNTER — Encounter: Payer: Self-pay | Admitting: Family Medicine

## 2016-04-16 VITALS — BP 185/98 | HR 71 | Temp 98.6°F | Ht 61.5 in | Wt 161.0 lb

## 2016-04-16 DIAGNOSIS — M7541 Impingement syndrome of right shoulder: Secondary | ICD-10-CM

## 2016-04-16 DIAGNOSIS — M25611 Stiffness of right shoulder, not elsewhere classified: Secondary | ICD-10-CM | POA: Diagnosis not present

## 2016-04-16 DIAGNOSIS — M25511 Pain in right shoulder: Secondary | ICD-10-CM

## 2016-04-16 MED ORDER — METHYLPREDNISOLONE ACETATE 40 MG/ML IJ SUSP
80.0000 mg | Freq: Once | INTRAMUSCULAR | Status: AC
  Administered 2016-04-16: 80 mg via INTRA_ARTICULAR

## 2016-04-16 MED ORDER — THYROID 90 MG PO TABS: ORAL_TABLET | ORAL | 1 refills | Status: DC

## 2016-04-16 NOTE — Progress Notes (Signed)
Dr. Frederico Hamman T. Shanetta Nicolls, MD, [facility] Medicine [place], [address] Phone: [phone] Fax: [phone]  04/16/2016  Patient: Kaitlyn Solomon, MRN: [id number], DOB: January 30, 1966, 51 y.o.  Primary Physician:  Arnette Norris, MD   Chief Complaint  Patient presents with  . Shoulder Pain    Right   Subjective:   This 51 y.o. female patient noted above presents with shoulder pain that has been ongoing for 3-4 weeks.  there is no history of trauma or accident recently The patient denies neck pain or radicular symptoms. Denies dislocation, subluxation, separation of the shoulder. The patient does complain of pain in the overhead plane with significant painful arc of motion. There is terminal stiffness, minimal loss of motion, more in the plane of internal range of motion and to a lesser degree, minorly and external rotation.  R shoulder - off and on has been hurting for 3-4 weeks a lot.   Medications Tried: Tylenol, NSAIDS Ice or Heat: minimally helpful Tried PT: No  Prior shoulder Injury: No Prior surgery: No Prior fracture: No  The PMH, PSH, Social History, Family History, Medications, and allergies have been reviewed in [facility], and have been updated if relevant.  Patient Active Problem List   Diagnosis Date Noted  . Screening examination for STD (sexually transmitted disease) 04/10/2016  . Vaginitis and vulvovaginitis 04/01/2012  . Migraine 12/04/2010  . Hypothyroidism 06/08/2010  . BIPOLAR AFFECTIVE DISORDER, MANIC, HX OF 07/13/2009  . Anxiety state 06/17/2009  . DEPRESSION 06/17/2009  . HYPERTENSION 06/17/2009  . IRRITABLE BOWEL SYNDROME 06/17/2009  . HEADACHE, CHRONIC 06/17/2009  . ANEMIA 06/15/2009    Past Medical History:  Diagnosis Date  . Anemia, unspecified   . Anxiety state, unspecified   . Bipolar disorder ([place])   . Depressive disorder, not elsewhere classified   . Fibroid tumor   . Headache(784.0)   .  Irritable bowel syndrome   . Migraine, unspecified, without mention of intractable migraine without mention of status migrainosus   . Unspecified essential hypertension     Past Surgical History:  Procedure Laterality Date  . ABDOMINAL HYSTERECTOMY  08/2008  . DILATION AND CURETTAGE OF UTERUS      Social History   Social History  . Marital status: Legally Separated    Spouse name: N/A  . Number of children: N/A  . 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 Smoking status: Former Research scientist (life sciences)  . Smokeless tobacco: Never Used  . Alcohol use No  . Drug use: No  . Sexual activity: Not on file   Other Topics Concern  . Not on file   Social History Narrative   Regular exercise: walks a lot at work   Caffeine use: yes    Family History  Problem Relation Age of Onset  . Cancer Mother     breast  . Diabetes Brother   . Cancer Paternal Aunt     breast  . Diabetes Paternal Aunt   . Cancer Maternal Grandmother     breast  . Diabetes Maternal Grandmother     Allergies  Allergen Reactions  . Codeine     REACTION: rash  . Lamotrigine Other (See Comments)    tremors     Medication list reviewed and updated in full in [facility].  GEN: No fevers, chills. Nontoxic. Primarily MSK c/o today. MSK: Detailed in the  HPI GI: tolerating PO intake without difficulty Neuro: No numbness, parasthesias, or tingling associated. Otherwise the pertinent positives of the ROS are noted above.   Objective:   Blood pressure (!) 185/98, pulse 71, temperature 98.6 F (37 C), temperature source Oral, height 5' 1.5" (1.562 m), weight 161 lb (73 kg).  GEN: Well-developed,well-nourished,in no acute distress; alert,appropriate and cooperative throughout examination HEENT: Normocephalic and atraumatic without obvious abnormalities. Ears, externally no deformities PULM: Breathing comfortably in no respiratory  distress EXT: No clubbing, cyanosis, or edema PSYCH: Normally interactive. Cooperative during the interview. Pleasant. Friendly and conversant. Not anxious or depressed appearing. Normal, full affect.  Shoulder: R Inspection: No muscle wasting or winging Ecchymosis/edema: neg  AC joint, scapula, clavicle: NT Cervical spine: NT, full ROM Spurling's: neg Abduction: full, 5/5 Flexion: full, 5/5 IR, full, lift-off: 5/5, minor restriction of motion ER at neutral: full, 5/5 L shoulder is extremely flexible with IROM and EROM AC crossover: neg Neer: pos Hawkins: pos Drop Test: neg Empty Can: pos Supraspinatus insertion: mild-mod T Bicipital groove: NT Speed's: neg Yergason's: neg Sulcus sign: neg Scapular dyskinesis: none C5-T1 intact  Neuro: Sensation intact Grip 5/5   Radiology: No results found.  Assessment and Plan:    Right shoulder pain, unspecified chronicity - Plan: methylPREDNISolone acetate (DEPO-MEDROL) injection 80 mg  Impingement syndrome of right shoulder  Shoulder stiffness, right  Possible early frozen shoulder, likely secondary from minimal use for pain.  Begin aggressive range of motion.  Rotator cuff strengthening and scapular stabilization exercises were reviewed with the patient.  [facility] RTC and scapular stabilization program with ROM program. Retraining shoulder mechanics and function was emphasized to the patient with rehab done at least 5-6 days a week.  Follow-up with PCP for thyroid and other chronic disease management.  Intrarticular Shoulder Injection, R Verbal consent was obtained from the patient. Risks including infection explained and contrasted with benefits and alternatives. Patient prepped with Chloraprep and Ethyl Chloride used for anesthesia. An intraarticular shoulder injection was performed using the posterior approach. The patient tolerated the procedure well and had decreased pain post injection. No complications. Injection: 4 cc of  Lidocaine 1% and 1 mL Depo-Medrol 40 mg. Needle: 22 gauge   SubAC Injection, R Verbal consent was obtained from the patient. Risks (including rare infection), benefits, and alternatives were explained. Patient prepped with Chloraprep and Ethyl Chloride used for anesthesia. The subacromial space was injected using the posterior approach. The patient tolerated the procedure well and had decreased pain post injection. No complications. Injection: 4 cc of Lidocaine 1% and 1 mL of Depo-Medrol 40 mg. Needle: 22 gauge     Follow-up: 6 weeks if needed  Modified Medications   Modified Medication Previous Medication   THYROID (ARMOUR THYROID) 90 MG TABLET thyroid (ARMOUR THYROID) 90 MG tablet      Take by mouth 1/2 tablet in am, fasting, daily    Take by mouth 1/2 tablet in am, fasting, daily   Signed,  Alonna Bartling T. Yuko Coventry, MD   Patient's Medications  New Prescriptions   No medications on file  Previous Medications   BUSPIRONE (BUSPAR) 30 MG TABLET    Take 1 tablet (30 mg total) by mouth 2 (two) times daily.   CHOLECALCIFEROL (VITAMIN D) 1000 UNITS TABLET    Take 1,000 Units by mouth daily.   CLONAZEPAM (KLONOPIN) 0.5 MG TABLET    Take 1 tablet (0.5 mg total) by mouth daily as needed for anxiety.   FLUCONAZOLE (DIFLUCAN) 150 MG TABLET  Take 1 tab by mouth now. Repeat in 3 days   METRONIDAZOLE (FLAGYL) 500 MG TABLET    Take 1 tablet (500 mg total) by mouth 2 (two) times daily.   MULTIPLE VITAMIN (MULTIVITAMIN) TABLET    Take 1 tablet by mouth daily.  Modified Medications   Modified Medication Previous Medication   THYROID (ARMOUR THYROID) 90 MG TABLET thyroid (ARMOUR THYROID) 90 MG tablet      Take by mouth 1/2 tablet in am, fasting, daily    Take by mouth 1/2 tablet in am, fasting, daily  Discontinued Medications   No medications on file

## 2016-04-16 NOTE — Progress Notes (Signed)
Pre visit review using our clinic review tool, if applicable. No additional management support is needed unless otherwise documented below in the visit note. 

## 2016-04-17 ENCOUNTER — Telehealth: Payer: Self-pay

## 2016-04-17 ENCOUNTER — Encounter: Payer: Self-pay | Admitting: Family Medicine

## 2016-04-17 NOTE — Telephone Encounter (Signed)
Pt left v/m requesting thyroid med sent to [facility] on [address]; left v/m for pt to have [facility] [address] call [facility] [address] for transfer of thyroid med.

## 2016-04-26 ENCOUNTER — Telehealth: Payer: Self-pay

## 2016-04-26 NOTE — Telephone Encounter (Signed)
Pt left v/m pt was seen 04/10/16 and given flagyl for yeast infection; yeast infection is no better and possibly worse pt wants to know if med could be sent to [facility]. Pt request cb.

## 2016-04-27 NOTE — Telephone Encounter (Signed)
Spoke to pt. Made appt with Dr Deborra Medina on Monday

## 2016-04-27 NOTE — Telephone Encounter (Signed)
Left message to call office

## 2016-04-27 NOTE — Telephone Encounter (Signed)
Wet prep was pos for BV, not yeast and we treated her with flagyl. If she did not respond to treatment, she needs to be re evaluated unfortunately.

## 2016-04-30 ENCOUNTER — Encounter: Payer: Self-pay | Admitting: Family Medicine

## 2016-04-30 ENCOUNTER — Ambulatory Visit (INDEPENDENT_AMBULATORY_CARE_PROVIDER_SITE_OTHER): Payer: PRIVATE HEALTH INSURANCE | Admitting: Family Medicine

## 2016-04-30 VITALS — BP 136/80 | HR 72 | Temp 97.9°F | Wt 164.0 lb

## 2016-04-30 DIAGNOSIS — N760 Acute vaginitis: Secondary | ICD-10-CM

## 2016-04-30 DIAGNOSIS — B9689 Other specified bacterial agents as the cause of diseases classified elsewhere: Secondary | ICD-10-CM | POA: Diagnosis not present

## 2016-04-30 MED ORDER — METRONIDAZOLE 0.75 % VA GEL: 1.0000 | Freq: Two times a day (BID) | VAGINAL | 0 refills | Status: AC

## 2016-04-30 NOTE — Patient Instructions (Signed)

## 2016-04-30 NOTE — Progress Notes (Signed)
Pre visit review using our clinic review tool, if applicable. No additional management support is needed unless otherwise documented below in the visit note. 

## 2016-04-30 NOTE — Progress Notes (Signed)
Subjective:   Patient ID: Kaitlyn Solomon, female    DOB: 08/13/1965, 51 y.o.   MRN: [id number]  Kaitlyn Solomon is a pleasant 51 y.o. year old female who presents to clinic today with Bacterial Vagnitis (She never had any discharge from the infection. The symptoms she had have not gotten better.)  on 04/30/2016  HPI:  BV-  Wet prep on 04/10/16 consistent with BV- treated with course of flagyl. Per pt, symptoms not improving. STD testing was neg.  She has not been sexually active in over six month.  Not having vaginal discharge.  Having vaginal irritation.  No changes in soaps or detergents.  Current Outpatient Prescriptions on File Prior to Visit  Medication Sig Dispense Refill  . busPIRone (BUSPAR) 30 MG tablet Take 1 tablet (30 mg total) by mouth 2 (two) times daily. 60 tablet 3  . cholecalciferol (VITAMIN D) 1000 UNITS tablet Take 1,000 Units by mouth daily.    . clonazePAM (KLONOPIN) 0.5 MG tablet Take 1 tablet (0.5 mg total) by mouth daily as needed for anxiety. 30 tablet 1  . Multiple Vitamin (MULTIVITAMIN) tablet Take 1 tablet by mouth daily.    Marland Kitchen thyroid (ARMOUR THYROID) 90 MG tablet Take by mouth 1/2 tablet in am, fasting, daily 45 tablet 1   No current facility-administered medications on file prior to visit.     Allergies  Allergen Reactions  . Codeine     REACTION: rash  . Lamotrigine Other (See Comments)    tremors     Past Medical History:  Diagnosis Date  . Anemia, unspecified   . Anxiety state, unspecified   . Bipolar disorder ([place])   . Depressive disorder, not elsewhere classified   . Fibroid tumor   . Headache(784.0)   . Irritable bowel syndrome   . Migraine, unspecified, without mention of intractable migraine without mention of status migrainosus   . Unspecified essential hypertension     Past Surgical History:  Procedure Laterality Date  . ABDOMINAL HYSTERECTOMY  08/2008  . DILATION AND CURETTAGE OF UTERUS      Family History    Problem Relation Age of Onset  . Cancer Mother     breast  . Diabetes Brother   . Cancer Paternal Aunt     breast  . Diabetes Paternal Aunt   . Cancer Maternal Grandmother     breast  . Diabetes Maternal Grandmother     Social History   Social History  . Marital status: Legally Separated    Spouse name: N/A  . Number of children: N/A  . 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 Smoking status: Former Research scientist (life sciences)  . Smokeless tobacco: Never Used  . Alcohol use No  . Drug use: No  . Sexual activity: Not on file   Other Topics Concern  . Not on file   Social History Narrative   Regular exercise: walks a lot at work   Caffeine use: yes   The PMH, PSH, Social History, Family History, Medications, and allergies have been reviewed in [facility], and have been updated if relevant.   Review of Systems  Genitourinary: Negative for vaginal discharge.       + vaginal irritation  All other systems reviewed and are negative.      Objective:    BP 136/80 (BP Location: Left Arm, Patient Position: Sitting, Cuff Size: Normal)   Pulse 72  Temp 97.9 F (36.6 C) (Oral)   Wt 164 lb (74.4 kg)   SpO2 99%   BMI 30.49 kg/m    Physical Exam  Constitutional: She is oriented to person, place, and time. She appears well-developed and well-nourished. No distress.  HENT:  Head: Normocephalic and atraumatic.  Eyes: Conjunctivae are normal.  Cardiovascular: Normal rate.   Pulmonary/Chest: Effort normal.  Neurological: She is alert and oriented to person, place, and time. No cranial nerve deficit.  Skin: Skin is warm and dry. She is not diaphoretic.  Psychiatric: She has a normal mood and affect. Her behavior is normal. Judgment and thought content normal.  Nursing note and vitals reviewed.         Assessment & Plan:   Vaginitis and vulvovaginitis No Follow-up on file.

## 2016-04-30 NOTE — Assessment & Plan Note (Signed)
Since she is not sexually active, agree with deferring wet prep/std testing today. Try topical metrogel. Refer to gyn for refractory BV. The patient indicates understanding of these issues and agrees with the plan.

## 2016-05-02 ENCOUNTER — Telehealth: Payer: Self-pay | Admitting: Family Medicine

## 2016-05-02 DIAGNOSIS — N760 Acute vaginitis: Secondary | ICD-10-CM

## 2016-05-02 MED ORDER — METRONIDAZOLE 500 MG PO TABS: 500.0000 mg | ORAL_TABLET | Freq: Two times a day (BID) | ORAL | 0 refills | Status: DC

## 2016-05-02 NOTE — Telephone Encounter (Signed)
Noted.  eRx sent. 

## 2016-05-02 NOTE — Telephone Encounter (Signed)
Notified pt Rx antibiotic been send to the pharmacy

## 2016-05-02 NOTE — Telephone Encounter (Signed)
Pt called stating she did change laundry degerent.  She stated she went and purchased dye free laundry degerent she is doing better and wanted to know if you would send in another round of antibiotic  [facility] [address] She wanted to hold off on referral to gyn

## 2016-05-10 NOTE — Telephone Encounter (Signed)
Prvious issue has flared up again. Pt is requesting a referral to a gynocologist. Please call with any questions.

## 2016-05-10 NOTE — Telephone Encounter (Signed)
Referral placed.

## 2016-06-06 ENCOUNTER — Encounter: Payer: Self-pay | Admitting: Obstetrics and Gynecology

## 2016-06-06 ENCOUNTER — Ambulatory Visit (INDEPENDENT_AMBULATORY_CARE_PROVIDER_SITE_OTHER): Payer: PRIVATE HEALTH INSURANCE | Admitting: Obstetrics and Gynecology

## 2016-06-06 VITALS — BP 160/120 | HR 84 | Ht 61.5 in | Wt 165.0 lb

## 2016-06-06 DIAGNOSIS — Z803 Family history of malignant neoplasm of breast: Secondary | ICD-10-CM

## 2016-06-06 DIAGNOSIS — N760 Acute vaginitis: Secondary | ICD-10-CM | POA: Diagnosis not present

## 2016-06-06 DIAGNOSIS — N898 Other specified noninflammatory disorders of vagina: Secondary | ICD-10-CM

## 2016-06-06 LAB — POCT WET PREP WITH KOH
Clue Cells Wet Prep HPF POC: NEGATIVE
KOH PREP POC: NEGATIVE
Trichomonas, UA: NEGATIVE
YEAST WET PREP PER HPF POC: NEGATIVE

## 2016-06-06 MED ORDER — AMPICILLIN 500 MG PO CAPS: 500.0000 mg | ORAL_CAPSULE | Freq: Four times a day (QID) | ORAL | 0 refills | Status: DC

## 2016-06-06 NOTE — Progress Notes (Addendum)
Chief Complaint  Patient presents with  . Gynecologic Exam    recurrent vag inf - sxs keep coming back;     HPI:      Ms. Kaitlyn Solomon is a 51 y.o. [id number] who LMP was No LMP recorded. Patient has had a hysterectomy., presents today for recurrent BV infections. She has a hx of recurrent yeast vag in the past, too. She has been treated with diflucan and flagyl/metrogel several times by her PCP since 2/18. Testing confirmed BV the first time. Sx resolve while on treatment but then recur. She notes vaginal burning/irritation without increased d/c or odor. She changed to hypoallergenic detergent/denies using dryer sheets, doesn't use soap vaginally. She is not sex active. Referred by PCP for sx.  Review of Systems  Constitutional: Negative for fever.  Gastrointestinal: Negative for blood in stool, constipation, diarrhea, nausea and vomiting.  Genitourinary: Positive for vaginal pain. Negative for dyspareunia, dysuria, flank pain, frequency, hematuria, urgency, vaginal bleeding and vaginal discharge.  Musculoskeletal: Negative for back pain.  Skin: Negative for rash.    Patient Active Problem List   Diagnosis Date Noted  . Screening examination for STD (sexually transmitted disease) 04/10/2016  . Vaginitis and vulvovaginitis 04/01/2012  . Migraine 12/04/2010  . Hypothyroidism 06/08/2010  . BIPOLAR AFFECTIVE DISORDER, MANIC, HX OF 07/13/2009  . Anxiety state 06/17/2009  . DEPRESSION 06/17/2009  . HYPERTENSION 06/17/2009  . IRRITABLE BOWEL SYNDROME 06/17/2009  . HEADACHE, CHRONIC 06/17/2009  . ANEMIA 06/15/2009    Family History  Problem Relation Age of Onset  . Breast cancer Mother 91  . Hypertension Mother   . Diabetes Brother   . Hypertension Brother   . Breast cancer Maternal Grandmother   . Diabetes Maternal Grandmother   . Hypertension Maternal Grandmother   . Breast cancer Maternal Aunt   . Diabetes Maternal Aunt   . Breast cancer Maternal Aunt   . Diabetes Cousin    . Diabetes Cousin   . Hypertension Brother     Social History   Social History  . Marital status: Legally Separated    Spouse name: N/A  . Number of children: N/A  . 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 Smoking status: Former Research scientist (life sciences)  . Smokeless tobacco: Never Used  . Alcohol use Yes     Comment: occ  . Drug use: No  . Sexual activity: Not Currently    Birth control/ protection: Surgical   Other Topics Concern  . Not on file   Social History Narrative   Regular exercise: walks a lot at work   Caffeine use: yes     Current Outpatient Prescriptions:  .  cholecalciferol (VITAMIN D) 1000 UNITS tablet, Take 1,000 Units by mouth daily., Disp: , Rfl:  .  clonazePAM (KLONOPIN) 0.5 MG tablet, Take 1 tablet (0.5 mg total) by mouth daily as needed for anxiety., Disp: 30 tablet, Rfl: 1 .  Multiple Vitamin (MULTIVITAMIN) tablet, Take 1 tablet by mouth daily., Disp: , Rfl:  .  thyroid (ARMOUR THYROID) 90 MG tablet, Take by mouth 1/2 tablet in am, fasting, daily, Disp: 45 tablet, Rfl: 1 .  ampicillin (PRINCIPEN) 500 MG capsule, Take 1 capsule (500 mg total) by mouth 4 (four) times daily., Disp: 28 capsule, Rfl: 0  OBJECTIVE:   Vitals:  BP (!) 160/120 (Patient Position: Sitting)   Pulse 84   Ht 5' 1.5" (1.562 m)   Wt  165 lb (74.8 kg)   BMI 30.67 kg/m   Physical Exam  Constitutional: She is oriented to person, place, and time and well-developed, well-nourished, and in no distress. Vital signs are normal.  Genitourinary: Right adnexa normal, left adnexa normal and vulva normal. Right adnexum displays no mass and no tenderness. Left adnexum displays no mass and no tenderness. Vulva exhibits no erythema, no exudate, no lesion, no rash and no tenderness. Vagina exhibits no lesion. White and vaginal discharge found.  Genitourinary Comments: Uterus/cx surg absent  Neurological: She is oriented to person, place,  and time.  Vitals reviewed.   Results: Results for orders placed or performed in visit on 06/06/16 (from the past 24 hour(s))  POCT Wet Prep with KOH     Status: Normal   Collection Time: 06/06/16  3:15 PM  Result Value Ref Range   Trichomonas, UA Negative    Clue Cells Wet Prep HPF POC neg    Epithelial Wet Prep HPF POC  Few, Moderate, Many, Too numerous to count   Yeast Wet Prep HPF POC neg    Bacteria Wet Prep HPF POC  Few   RBC Wet Prep HPF POC     WBC Wet Prep HPF POC     KOH Prep POC Negative Negative       Assessment/Plan: Vulvovaginitis - Neg wet prep/exam. Question AV by sx hx. Rx ampicillin eRxd. Check One Swab culture. Will f/u with results.  - Plan: Other/Misc lab test, ampicillin (PRINCIPEN) 500 MG capsule  Vaginal discharge - Hx of recurrent yeast vag in the past. - Plan: POCT Wet Prep with KOH  Family history of breast cancer - Pt sched to get mammo. My Risk Update test discussed and pt declines. F/u prn. Most likely increased risk of breast cancer based on FH. Prob qualifies for MRI.     Return if symptoms worsen or fail to improve.  Tashia Leiterman B. Jerimie Mancuso, PA-C 06/06/2016 3:20 PM   06/12/16  ADDENDUM: One Swab culture confirmed Aerobic Vaginitis with E. Faecalis, which responds to ampicillin. Pt going to pick Rx up today to start tx. Culture did not show any BV or yeast path. F/u prn.

## 2016-06-09 ENCOUNTER — Encounter: Payer: Self-pay | Admitting: Obstetrics and Gynecology

## 2016-06-11 ENCOUNTER — Encounter: Payer: Self-pay | Admitting: Obstetrics and Gynecology

## 2016-06-11 NOTE — Telephone Encounter (Signed)
This encounter was created in error - please disregard.

## 2016-06-12 ENCOUNTER — Other Ambulatory Visit: Payer: Self-pay | Admitting: Obstetrics and Gynecology

## 2016-06-12 ENCOUNTER — Telehealth: Payer: Self-pay | Admitting: Obstetrics and Gynecology

## 2016-06-12 DIAGNOSIS — N760 Acute vaginitis: Secondary | ICD-10-CM

## 2016-06-12 MED ORDER — AMPICILLIN 500 MG PO CAPS: 500.0000 mg | ORAL_CAPSULE | Freq: Four times a day (QID) | ORAL | 0 refills | Status: AC

## 2016-06-12 NOTE — Telephone Encounter (Signed)
Pt is calling today unable to get her prescription that was sent in 5 days ago in [place] on [address] . Pt is requesting to have the precrption sent to [facility]/ [facility] on [address] in [place]. Please advise

## 2016-06-12 NOTE — Telephone Encounter (Signed)
I tried to call but couldn't get the phone number to work, it wouldn't ring. Would you mind trying to reach this patient? Thank you!

## 2016-06-12 NOTE — Telephone Encounter (Signed)
Rx sent to [facility]. RN to notify pt.

## 2016-06-13 NOTE — Telephone Encounter (Signed)
Left detailed msg that [facility] eRx'd rx to her pharm.

## 2016-06-27 ENCOUNTER — Encounter: Payer: Self-pay | Admitting: Obstetrics and Gynecology

## 2016-06-27 ENCOUNTER — Ambulatory Visit (INDEPENDENT_AMBULATORY_CARE_PROVIDER_SITE_OTHER): Payer: PRIVATE HEALTH INSURANCE | Admitting: Obstetrics and Gynecology

## 2016-06-27 VITALS — BP 140/100 | HR 66 | Ht 61.0 in | Wt 166.0 lb

## 2016-06-27 DIAGNOSIS — N949 Unspecified condition associated with female genital organs and menstrual cycle: Secondary | ICD-10-CM

## 2016-06-27 DIAGNOSIS — N761 Subacute and chronic vaginitis: Secondary | ICD-10-CM | POA: Diagnosis not present

## 2016-06-27 LAB — POCT WET PREP WITH KOH
CLUE CELLS WET PREP PER HPF POC: NEGATIVE
KOH Prep POC: NEGATIVE
TRICHOMONAS UA: NEGATIVE
Yeast Wet Prep HPF POC: NEGATIVE

## 2016-06-27 MED ORDER — MOXIFLOXACIN HCL 400 MG PO TABS: 400.0000 mg | ORAL_TABLET | Freq: Every day | ORAL | 0 refills | Status: DC

## 2016-06-27 NOTE — Progress Notes (Signed)
Chief Complaint  Patient presents with  . Follow-up    vaginitis, c/o itching and irritation    HPI:      Ms. Kaitlyn Solomon is a 51 y.o. [id number] who LMP was No LMP recorded. Patient has had a hysterectomy., presents today for continued vaginal burning/irritation sx without increased d/c, odor. She was seen 06/12/16 for same sx. Culture showed AV (E. Faecalis) and pt was treated with ampicillin for 7 days. Sx improved while on Rx but then  recurred 2-3 days later.   Patient Active Problem List   Diagnosis Date Noted  . Screening examination for STD (sexually transmitted disease) 04/10/2016  . Vaginitis and vulvovaginitis 04/01/2012  . Migraine 12/04/2010  . Hypothyroidism 06/08/2010  . BIPOLAR AFFECTIVE DISORDER, MANIC, HX OF 07/13/2009  . Anxiety state 06/17/2009  . DEPRESSION 06/17/2009  . HYPERTENSION 06/17/2009  . IRRITABLE BOWEL SYNDROME 06/17/2009  . HEADACHE, CHRONIC 06/17/2009  . ANEMIA 06/15/2009    Family History  Problem Relation Age of Onset  . Breast cancer Mother 38  . Hypertension Mother   . Diabetes Brother   . Hypertension Brother   . Breast cancer Maternal Grandmother   . Diabetes Maternal Grandmother   . Hypertension Maternal Grandmother   . Breast cancer Maternal Aunt   . Diabetes Maternal Aunt   . Breast cancer Maternal Aunt   . Diabetes Cousin   . Diabetes Cousin   . Hypertension Brother     Social History   Social History  . Marital status: Legally Separated    Spouse name: N/A  . Number of children: N/A  . 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 Smoking status: Former Research scientist (life sciences)  . Smokeless tobacco: Never Used  . Alcohol use Yes     Comment: occ  . Drug use: No  . Sexual activity: Not Currently    Birth control/ protection: Surgical   Other Topics Concern  . Not on file   Social History Narrative   Regular exercise: walks a lot at work   Caffeine use: yes       Current Outpatient Prescriptions:  .  cholecalciferol (VITAMIN D) 1000 UNITS tablet, Take 1,000 Units by mouth daily., Disp: , Rfl:  .  clonazePAM (KLONOPIN) 0.5 MG tablet, Take 1 tablet (0.5 mg total) by mouth daily as needed for anxiety., Disp: 30 tablet, Rfl: 1 .  moxifloxacin (AVELOX) 400 MG tablet, Take 1 tablet (400 mg total) by mouth daily at 8 pm., Disp: 6 tablet, Rfl: 0 .  Multiple Vitamin (MULTIVITAMIN) tablet, Take 1 tablet by mouth daily., Disp: , Rfl:  .  thyroid (ARMOUR THYROID) 90 MG tablet, Take by mouth 1/2 tablet in am, fasting, daily, Disp: 45 tablet, Rfl: 1  Review of Systems  Constitutional: Negative for fever.  Gastrointestinal: Negative for blood in stool, constipation, diarrhea, nausea and vomiting.  Genitourinary: Positive for vaginal pain. Negative for dyspareunia, dysuria, flank pain, frequency, hematuria, urgency, vaginal bleeding and vaginal discharge.  Musculoskeletal: Negative for back pain.  Skin: Negative for rash.     OBJECTIVE:   Vitals:  BP (!) 140/100   Pulse 66   Ht 5\' 1"  (1.549 m)   Wt 166 lb (75.3 kg)   BMI 31.37 kg/m   Physical Exam  Constitutional: She is oriented to person, place, and time and well-developed, well-nourished, and in no distress. Vital signs are normal.  Genitourinary: Vagina normal, uterus  normal, cervix normal, right adnexa normal, left adnexa normal and vulva normal. Uterus is not enlarged. Cervix exhibits no motion tenderness and no tenderness. Right adnexum displays no mass and no tenderness. Left adnexum displays no mass and no tenderness. Vulva exhibits no erythema, no exudate, no lesion, no rash and no tenderness. Vagina exhibits no lesion.  Neurological: She is oriented to person, place, and time.  Vitals reviewed.   Results: Results for orders placed or performed in visit on 06/27/16 (from the past 24 hour(s))  POCT Wet Prep with KOH     Status: Normal   Collection Time: 06/27/16  4:47 PM  Result Value  Ref Range   Trichomonas, UA Negative    Clue Cells Wet Prep HPF POC neg    Epithelial Wet Prep HPF POC  Few, Moderate, Many, Too numerous to count   Yeast Wet Prep HPF POC neg    Bacteria Wet Prep HPF POC  Few   RBC Wet Prep HPF POC     WBC Wet Prep HPF POC     KOH Prep POC Negative Negative     Assessment/Plan: Subacute vaginitis - Neg wet prep/ pos AV on culture 2 wks ago. Retreat for AV since hard to treat. Rx avelox/add probiotics. F/u prn.  - Plan: moxifloxacin (AVELOX) 400 MG tablet, POCT Wet Prep with KOH  Vaginal burning     Meds ordered this encounter  Medications  . moxifloxacin (AVELOX) 400 MG tablet    Sig: Take 1 tablet (400 mg total) by mouth daily at 8 pm.    Dispense:  6 tablet    Refill:  0      Return if symptoms worsen or fail to improve.  Breane Grunwald B. Bary Limbach, PA-C 06/27/2016 4:47 PM

## 2016-06-28 ENCOUNTER — Telehealth: Payer: Self-pay

## 2016-06-28 ENCOUNTER — Other Ambulatory Visit: Payer: Self-pay | Admitting: Obstetrics and Gynecology

## 2016-06-28 DIAGNOSIS — N761 Subacute and chronic vaginitis: Secondary | ICD-10-CM

## 2016-06-28 MED ORDER — MOXIFLOXACIN HCL 400 MG PO TABS: 400.0000 mg | ORAL_TABLET | Freq: Every day | ORAL | 0 refills | Status: DC

## 2016-06-28 NOTE — Telephone Encounter (Signed)
Pt calling.  [facility] doesn't have the antibx that was sent in yesterday .  Please call it in to [facility] [place] in [place].  [phone]

## 2016-06-28 NOTE — Telephone Encounter (Signed)
Done

## 2016-07-30 ENCOUNTER — Ambulatory Visit (INDEPENDENT_AMBULATORY_CARE_PROVIDER_SITE_OTHER): Payer: PRIVATE HEALTH INSURANCE | Admitting: Family Medicine

## 2016-07-30 VITALS — BP 126/70 | HR 67 | Temp 97.9°F | Resp 14 | Ht 61.0 in | Wt 165.5 lb

## 2016-07-30 DIAGNOSIS — E039 Hypothyroidism, unspecified: Secondary | ICD-10-CM

## 2016-07-30 DIAGNOSIS — N760 Acute vaginitis: Secondary | ICD-10-CM

## 2016-07-30 DIAGNOSIS — N761 Subacute and chronic vaginitis: Secondary | ICD-10-CM

## 2016-07-30 MED ORDER — LEVOTHYROXINE SODIUM 75 MCG PO TABS: 75.0000 ug | ORAL_TABLET | Freq: Every day | ORAL | 5 refills | Status: DC

## 2016-07-30 MED ORDER — MOXIFLOXACIN HCL 400 MG PO TABS: 400.0000 mg | ORAL_TABLET | Freq: Every day | ORAL | 0 refills | Status: AC

## 2016-07-30 NOTE — Progress Notes (Signed)
Pre visit review using our clinic review tool, if applicable. No additional management support is needed unless otherwise documented below in the visit note. 

## 2016-07-30 NOTE — Progress Notes (Signed)
Subjective:   Patient ID: Kaitlyn Solomon, female    DOB: 1966/02/01, 51 y.o.   MRN: [id number]  Kaitlyn Solomon is a pleasant 51 y.o. year old female who presents to clinic today with Vaginitis (burning, denies discharge, odor- seen 04/2016 ) and Back Pain  on 07/30/2016  HPI:  Subacute vaginitis-  Referred her to GYN for persistent symptoms last month.  Saw Alicia Copland on 0/1/00 and again 06/27/16. Notes reviewed.  At 06/12/16 visit- Culture showed AV (E. Faecalis) and pt was treated with ampicillin for 7 days. Sx improved while on Rx but then  recurred 2-3 days later.  At 06/29/16 visit- Neg wet prep/ pos AV on culture 2 wks ago. Retreat for AV since hard to treat. Rx avelox/add probiotics.  Here today because symptoms of vaginal irritation and discharge returned a few days after finishing avelox.  Hypothyroidism- switched from levothyroxine to armour years ago because she wanted to try something more natural.  This is becoming cost prohibitive and she is asking to change back to levothyroxine. Current Outpatient Prescriptions on File Prior to Visit  Medication Sig Dispense Refill  . cholecalciferol (VITAMIN D) 1000 UNITS tablet Take 1,000 Units by mouth daily.    . clonazePAM (KLONOPIN) 0.5 MG tablet Take 1 tablet (0.5 mg total) by mouth daily as needed for anxiety. 30 tablet 1  . Multiple Vitamin (MULTIVITAMIN) tablet Take 1 tablet by mouth daily.     No current facility-administered medications on file prior to visit.     Allergies  Allergen Reactions  . Codeine     REACTION: rash  . Lamotrigine Other (See Comments)    tremors     Past Medical History:  Diagnosis Date  . Anxiety state, unspecified   . Depressive disorder, not elsewhere classified   . Family history of breast cancer 07/05/2011   Pt states she was BRCA neg about 2009  . Fibroid tumor   . Irregular menses 2007  . Irritable bowel syndrome   . Unspecified essential hypertension   . Vitamin D  deficiency 06/2011    Past Surgical History:  Procedure Laterality Date  . ABDOMINAL HYSTERECTOMY  08/2008  . DILATION AND CURETTAGE OF UTERUS    . WISDOM TOOTH EXTRACTION  1998    Family History  Problem Relation Age of Onset  . Breast cancer Mother 30  . Hypertension Mother   . Diabetes Brother   . Hypertension Brother   . Breast cancer Maternal Grandmother   . Diabetes Maternal Grandmother   . Hypertension Maternal Grandmother   . Breast cancer Maternal Aunt   . Diabetes Maternal Aunt   . Breast cancer Maternal Aunt   . Diabetes Cousin   . Diabetes Cousin   . Hypertension Brother     Social History   Social History  . Marital status: Legally Separated    Spouse name: N/A  . Number of children: N/A  . 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 Smoking status: Former Research scientist (life sciences)  . Smokeless tobacco: Never Used  . Alcohol use Yes     Comment: occ  . Drug use: No  . Sexual activity: Not Currently    Birth control/ protection: Surgical   Other Topics Concern  . Not on file   Social History Narrative   Regular exercise: walks a lot at work   Caffeine use: yes   The PMH, [place], Social  History, Family History, Medications, and allergies have been reviewed in [facility], and have been updated if relevant.  Review of Systems  Genitourinary: Positive for vaginal discharge and vaginal pain. Negative for decreased urine volume, difficulty urinating, dyspareunia, dysuria, enuresis, flank pain, frequency, genital sores, hematuria, menstrual problem, pelvic pain, urgency and vaginal bleeding.  All other systems reviewed and are negative.      Objective:    BP 126/70 (BP Location: Left Arm, Patient Position: Sitting, Cuff Size: Small)   Pulse 67   Temp 97.9 F (36.6 C) (Oral)   Resp 14   Ht '5\' 1"'  (1.549 m)   Wt 165 lb 8 oz (75.1 kg)   SpO2 97%   BMI 31.27 kg/m    Physical Exam  Constitutional: She is  oriented to person, place, and time. She appears well-developed and well-nourished. No distress.  HENT:  Head: Normocephalic and atraumatic.  Eyes: Conjunctivae are normal.  Cardiovascular: Normal rate.   Pulmonary/Chest: Effort normal.  Musculoskeletal: Normal range of motion.  Neurological: She is alert and oriented to person, place, and time. No cranial nerve deficit.  Skin: Skin is warm and dry. She is not diaphoretic.  Psychiatric: She has a normal mood and affect. Her behavior is normal. Judgment and thought content normal.  Nursing note and vitals reviewed.         Assessment & Plan:   Vaginitis and vulvovaginitis  Subacute vaginitis - Neg wet prep/ pos AV on culture 2 wks ago. Retreat for AV since hard to treat. Rx avelox/add probiotics. F/u prn.  - Plan: moxifloxacin (AVELOX) 400 MG tablet  Hypothyroidism, unspecified type No Follow-up on file.

## 2016-07-30 NOTE — Assessment & Plan Note (Signed)
D/c armour. Start previous dose of levothyroxine- 75 mcg daily. Follow up in 3-4 weeks for labs. The patient indicates understanding of these issues and agrees with the plan.

## 2016-07-30 NOTE — Assessment & Plan Note (Signed)
Deteriorated. Refractory vaginitis- explained to pt that I referred her to GYN for this as it is now outside my scope of practice.  Will refill avelox but advised her to tcall her gynecologist after leaving the office today to schedule a follow up appointment with her. The patient indicates understanding of these issues and agrees with the plan.

## 2016-07-30 NOTE — Patient Instructions (Addendum)
Great to see you.   Please call your Gynecologist when you leave here.  Stop armour.  Restart levothyroxine. Return in 3-4 weeks for follow up.

## 2016-08-20 ENCOUNTER — Other Ambulatory Visit: Payer: Self-pay | Admitting: Family Medicine

## 2016-08-20 ENCOUNTER — Ambulatory Visit: Payer: PRIVATE HEALTH INSURANCE | Admitting: Family Medicine

## 2016-08-20 ENCOUNTER — Other Ambulatory Visit (INDEPENDENT_AMBULATORY_CARE_PROVIDER_SITE_OTHER): Payer: PRIVATE HEALTH INSURANCE

## 2016-08-20 DIAGNOSIS — E039 Hypothyroidism, unspecified: Secondary | ICD-10-CM | POA: Diagnosis not present

## 2016-08-20 MED ORDER — CLONAZEPAM 0.5 MG PO TABS: 0.5000 mg | ORAL_TABLET | Freq: Every day | ORAL | 1 refills | Status: DC | PRN

## 2016-08-21 LAB — T4, FREE: FREE T4: 1.78 ng/dL — AB (ref 0.60–1.60)

## 2016-08-21 LAB — T3, FREE: T3 FREE: 6.6 pg/mL — AB (ref 2.3–4.2)

## 2016-08-21 LAB — TSH: TSH: 1.66 u[IU]/mL (ref 0.35–4.50)

## 2016-08-23 ENCOUNTER — Encounter: Payer: Self-pay | Admitting: *Deleted

## 2016-08-27 LAB — CELIAC PNL 2 RFLX ENDOMYSIAL AB TTR
(tTG) Ab, IgA: <1 U/mL
Endomysial Ab IgA: NEGATIVE
GLIADIN(DEAM) AB,IGA: 3 U (ref <20)
Gliadin(Deam) Ab,IgG: 4 U (ref <20)
Immunoglobulin A: 182 mg/dL (ref 81–463)

## 2016-09-25 ENCOUNTER — Encounter (INDEPENDENT_AMBULATORY_CARE_PROVIDER_SITE_OTHER): Payer: Self-pay

## 2016-09-25 ENCOUNTER — Ambulatory Visit (INDEPENDENT_AMBULATORY_CARE_PROVIDER_SITE_OTHER): Payer: PRIVATE HEALTH INSURANCE | Admitting: Family Medicine

## 2016-09-25 ENCOUNTER — Encounter: Payer: Self-pay | Admitting: Family Medicine

## 2016-09-25 VITALS — BP 120/78 | HR 66 | Resp 18 | Wt 163.0 lb

## 2016-09-25 DIAGNOSIS — M546 Pain in thoracic spine: Secondary | ICD-10-CM

## 2016-09-25 MED ORDER — CYCLOBENZAPRINE HCL 10 MG PO TABS: 10.0000 mg | ORAL_TABLET | Freq: Every evening | ORAL | 0 refills | Status: DC | PRN

## 2016-09-25 NOTE — Progress Notes (Signed)
SUBJECTIVE:  Kaitlyn Solomon is a 51 y.o. female who complains of mid thoracic back pain for 3 month(s) without radiation down the legs. Precipitating factors: none recalled by the patient. Prior history of back problems: recurrent self limited episodes of low back pain in the past. There is no numbness in the legs.  Current Outpatient Prescriptions on File Prior to Visit  Medication Sig Dispense Refill  . cholecalciferol (VITAMIN D) 1000 UNITS tablet Take 1,000 Units by mouth daily.    . clonazePAM (KLONOPIN) 0.5 MG tablet Take 1 tablet (0.5 mg total) by mouth daily as needed for anxiety. 30 tablet 1  . levothyroxine (SYNTHROID, LEVOTHROID) 75 MCG tablet Take 1 tablet (75 mcg total) by mouth daily. 30 tablet 5  . Multiple Vitamin (MULTIVITAMIN) tablet Take 1 tablet by mouth daily.     No current facility-administered medications on file prior to visit.     Allergies  Allergen Reactions  . Codeine     REACTION: rash  . Lamotrigine Other (See Comments)    tremors     Past Medical History:  Diagnosis Date  . Anxiety state, unspecified   . Depressive disorder, not elsewhere classified   . Family history of breast cancer 07/05/2011   Pt states she was BRCA neg about 2009  . Fibroid tumor   . Irregular menses 2007  . Irritable bowel syndrome   . Unspecified essential hypertension   . Vitamin D deficiency 06/2011    Past Surgical History:  Procedure Laterality Date  . ABDOMINAL HYSTERECTOMY  08/2008  . DILATION AND CURETTAGE OF UTERUS    . WISDOM TOOTH EXTRACTION  1998    Family History  Problem Relation Age of Onset  . Breast cancer Mother 78  . Hypertension Mother   . Diabetes Brother   . Hypertension Brother   . Breast cancer Maternal Grandmother   . Diabetes Maternal Grandmother   . Hypertension Maternal Grandmother   . Breast cancer Maternal Aunt   . Diabetes Maternal Aunt   . Breast cancer Maternal Aunt   . Diabetes Cousin   . Diabetes Cousin   . Hypertension  Brother     Social History   Social History  . Marital status: Legally Separated    Spouse name: N/A  . Number of children: N/A  . 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 Smoking status: Former Research scientist (life sciences)  . Smokeless tobacco: Never Used  . Alcohol use Yes     Comment: occ  . Drug use: No  . Sexual activity: Not Currently    Birth control/ protection: Surgical   Other Topics Concern  . Not on file   Social History Narrative   Regular exercise: walks a lot at work   Caffeine use: yes   The PMH, PSH, Social History, Family History, Medications, and allergies have been reviewed in [facility], and have been updated if relevant.  OBJECTIVE: BP 120/78   Pulse 66   Resp 18   Wt 163 lb (73.9 kg)   SpO2 97%   BMI 30.80 kg/m    Physical Exam  Constitutional: She is oriented to person, place, and time and well-developed, well-nourished, and in no distress. No distress.  HENT:  Head: Normocephalic.  Eyes: Conjunctivae are normal.  Cardiovascular: Normal rate.   Pulmonary/Chest: Effort normal.  She does have large breasts  Musculoskeletal:       Thoracic back: She exhibits  spasm. She exhibits no tenderness, no bony tenderness, no swelling, no edema and no deformity.  Neurological: She is alert and oriented to person, place, and time.  Skin: Skin is warm and dry. She is not diaphoretic.  Psychiatric: Mood, memory, affect and judgment normal.  Nursing note and vitals reviewed.    ASSESSMENT:  Thoracic strain due to large breasts  PLAN: For acute pain, rest, intermittent application of heat (do not sleep on heating pad), analgesics and muscle relaxants are recommended. Discussed longer term treatment plan of breast reduction.  She will call her insurance company to inquire about what they would cover for this type of surgery.

## 2016-09-25 NOTE — Patient Instructions (Signed)
Great to see you. Please call you insurance company and ask about breast reduction surgery.  Flexeril as needed at bedtime for back pain.

## 2017-01-23 ENCOUNTER — Emergency Department (HOSPITAL_COMMUNITY): Payer: PRIVATE HEALTH INSURANCE

## 2017-01-23 ENCOUNTER — Encounter (HOSPITAL_COMMUNITY): Payer: Self-pay | Admitting: Emergency Medicine

## 2017-01-23 ENCOUNTER — Emergency Department (HOSPITAL_COMMUNITY)
Admission: EM | Admit: 2017-01-23 | Discharge: 2017-01-23 | Disposition: A | Discharge status: Home / Self Care | Payer: PRIVATE HEALTH INSURANCE | Attending: Emergency Medicine | Admitting: Emergency Medicine

## 2017-01-23 DIAGNOSIS — J069 Acute upper respiratory infection, unspecified: Secondary | ICD-10-CM | POA: Diagnosis not present

## 2017-01-23 DIAGNOSIS — Z79899 Other long term (current) drug therapy: Secondary | ICD-10-CM | POA: Diagnosis not present

## 2017-01-23 DIAGNOSIS — M25561 Pain in right knee: Secondary | ICD-10-CM | POA: Diagnosis not present

## 2017-01-23 DIAGNOSIS — Z87891 Personal history of nicotine dependence: Secondary | ICD-10-CM | POA: Insufficient documentation

## 2017-01-23 DIAGNOSIS — R05 Cough: Secondary | ICD-10-CM | POA: Diagnosis present

## 2017-01-23 DIAGNOSIS — B9789 Other viral agents as the cause of diseases classified elsewhere: Secondary | ICD-10-CM | POA: Diagnosis not present

## 2017-01-23 MED ORDER — IBUPROFEN 400 MG PO TABS
600.0000 mg | ORAL_TABLET | Freq: Once | ORAL | Status: AC
  Administered 2017-01-23: 600 mg via ORAL
  Filled 2017-01-23: qty 1

## 2017-01-23 NOTE — ED Triage Notes (Signed)
Pt presents to ED for couple different complaints - states she has had a sore throat and other cold-like symptoms x 5-6 days, no fever today. Patient also c/o R knee pain after slipping on ice at 5:30 this morning - pt reports swelling and pain have increased, unable to bear full weight on R leg d/t pain.

## 2017-01-23 NOTE — ED Provider Notes (Signed)
[name] EMERGENCY DEPARTMENT Provider Note   CSN: [id number] Arrival date & time: 01/23/17  1202     History   Chief Complaint Chief Complaint  Patient presents with  . Knee Pain  . Sore Throat    HPI  Kaitlyn Solomon is a 51 y.o. Female who presents with right knee pain after she slipped on ice and landed on her right knee at about 5:30 this morning. Patient reports since then she's had pain and swelling to the right knee, reports is painful to bear weight on the right leg. Pt localizes pain over the patella, no pain on the medial and lateral aspect of the knee. Denies numbness or tingling distal to the injury. Denies any other injuries from the fall, did not hit her head, no loss of consciousness, no other extremity injuries. Patient also reports 5-6 days of sore throat, nasal congestion, and dry nonproductive cough. Patient reports she's been treating the symptoms with over-the-counter medications, with no improvement and feels like symptoms are getting worse rather than better. Patient reports sore scratchy throat, still been tolerating secretions, unable to eat and drink well. Denies fevers or chills. No chest pain or shortness of breath. No abdominal pain, nausea, vomiting or diarrhea.        Past Medical History:  Diagnosis Date  . Anxiety state, unspecified   . Depressive disorder, not elsewhere classified   . Family history of breast cancer 07/05/2011   Pt states she was BRCA neg about 2009  . Fibroid tumor   . Irregular menses 2007  . Irritable bowel syndrome   . Unspecified essential hypertension   . Vitamin D deficiency 06/2011    Patient Active Problem List   Diagnosis Date Noted  . Migraine 12/04/2010  . BIPOLAR AFFECTIVE DISORDER, MANIC, HX OF 07/13/2009  . Anxiety state 06/17/2009  . DEPRESSION 06/17/2009  . HYPERTENSION 06/17/2009  . IRRITABLE BOWEL SYNDROME 06/17/2009  . HEADACHE, CHRONIC 06/17/2009  . ANEMIA 06/15/2009     Past Surgical History:  Procedure Laterality Date  . ABDOMINAL HYSTERECTOMY  08/2008  . DILATION AND CURETTAGE OF UTERUS    . WISDOM TOOTH EXTRACTION  1998    OB History    Gravida Para Term Preterm AB Living   _0 0 1 4   SAB TAB Ectopic Multiple Live Births   1 0 0 0 4       Home Medications    Prior to Admission medications   Medication Sig Start Date End Date Taking? Authorizing Provider  cholecalciferol (VITAMIN D) 1000 UNITS tablet Take 1,000 Units by mouth daily.    [provider]  clonazePAM (KLONOPIN) 0.5 MG tablet Take 1 tablet (0.5 mg total) by mouth daily as needed for anxiety. 08/20/16   Lucille Passy, MD  cyclobenzaprine (FLEXERIL) 10 MG tablet Take 1 tablet (10 mg total) by mouth at bedtime as needed for muscle spasms. 09/25/16   Lucille Passy, MD  levothyroxine (SYNTHROID, LEVOTHROID) 75 MCG tablet Take 1 tablet (75 mcg total) by mouth daily. 07/30/16   Lucille Passy, MD  Multiple Vitamin (MULTIVITAMIN) tablet Take 1 tablet by mouth daily.    [provider]    Family History Family History  Problem Relation Age of Onset  . Breast cancer Mother 61  . Hypertension Mother   . Diabetes Brother   . Hypertension Brother   . Breast cancer Maternal Grandmother   . Diabetes Maternal Grandmother   . Hypertension  Maternal Grandmother   . Breast cancer Maternal Aunt   . Diabetes Maternal Aunt   . Breast cancer Maternal Aunt   . Diabetes Cousin   . Diabetes Cousin   . Hypertension Brother     Social History Social History   Tobacco Use  . Smoking status: Former Smoker    Types: Cigarettes    Last attempt to quit: 01/23/1990    Years since quitting: 27.0  . Smokeless tobacco: Never Used  Substance Use Topics  . Alcohol use: Yes    Comment: occ  . Drug use: No     Allergies   Codeine and Lamotrigine   Review of Systems Review of Systems  Constitutional: Negative for chills and fever.  HENT: Positive for congestion, postnasal  drip and rhinorrhea.   Eyes: Negative for discharge, redness and itching.  Respiratory: Positive for cough. Negative for chest tightness, shortness of breath, wheezing and stridor.   Cardiovascular: Negative for chest pain.  Gastrointestinal: Negative for abdominal pain, diarrhea, nausea and vomiting.  Musculoskeletal: Positive for arthralgias (R knee) and joint swelling. Negative for back pain, myalgias and neck pain.  Skin: Negative for color change, rash and wound.  Neurological: Negative for weakness and numbness.     Physical Exam Updated Vital Signs BP (!) 169/96 (BP Location: Right Arm)   Pulse 82   Temp 98.9 F (37.2 C) (Oral)   Resp 20   SpO2 100%   Physical Exam  Constitutional: She appears well-developed and well-nourished. No distress.  HENT:  Head: Normocephalic and atraumatic.  TMs clear with good landmarks, moderate nasal mucosa edema with clear rhinorrhea, posterior oropharynx clear and moist, with some erythema, no edema or exudates  Eyes: Right eye exhibits no discharge. Left eye exhibits no discharge.  Cardiovascular: Normal rate, regular rhythm and normal heart sounds.  Pulmonary/Chest: Effort normal and breath sounds normal. No stridor. No respiratory distress. She has no wheezes. She has no rales.  Abdominal: Soft. Bowel sounds are normal.  Musculoskeletal:  Right knee with full tenderness over the patella, and mild swelling, no appreciable deformity, erythema or warmth. Patient able to flex and extend the knee with some pain. DP and TP pulses 2+, good capillary refill, sensation intact distal to the injury, 5/5 dorsi and plantar flexion of the right ankle, no pain with movement of the right ankle, no pain at the hip  Neurological: She is alert. Coordination normal.  Skin: Skin is warm and dry. Capillary refill takes less than 2 seconds. She is not diaphoretic.  Psychiatric: She has a normal mood and affect. Her behavior is normal.  Nursing note and vitals  reviewed.    ED Treatments / Results  Labs (all labs ordered are listed, but only abnormal results are displayed) Labs Reviewed - No data to display  EKG  EKG Interpretation None       Radiology Dg Chest 2 View  Result Date: 01/23/2017 CLINICAL DATA:  Complains of dry cough with burning in chest. EXAM: CHEST  2 VIEW COMPARISON:  09/03/2013. FINDINGS: The heart is enlarged. Calcified tortuous aorta. No consolidation or edema. No effusion or pneumothorax. Degenerative change in the thoracic spine. Similar appearance to priors. IMPRESSION: Cardiomegaly.  No active disease. Electronically Signed   By: Staci Righter M.D.   On: 01/23/2017 16:12   Dg Knee Complete 4 Views Right  Result Date: 01/23/2017 CLINICAL DATA:  Right knee pain due to a slip and fall on ice this morning. Initial encounter. EXAM: RIGHT KNEE -  COMPLETE 4+ VIEW COMPARISON:  None. FINDINGS: No evidence of fracture, dislocation, or joint effusion. No evidence of arthropathy or other focal bone abnormality. Soft tissues are unremarkable. IMPRESSION: Normal exam. Electronically Signed   By: Inge Rise M.D.   On: 01/23/2017 13:42    Procedures Procedures (including critical care time)  Medications Ordered in ED Medications  ibuprofen (ADVIL,MOTRIN) tablet 600 mg (600 mg Oral Given 01/23/17 1527)     Initial Impression / Assessment and Plan / ED Course  I have reviewed the triage vital signs and the nursing notes.  Pertinent labs & imaging results that were available during my care of the patient were reviewed by me and considered in my medical decision making (see chart for details).  Patient presents with right knee pain and swelling after she slipped and fell on the ice landing on this knee, no other injuries. On exam patient is mildly hypertensive, vitals otherwise normal. Right knee with tenderness over the patella and mild swelling, no warmth or erythema, no wounds or abrasions. Right leg is  neurovascularly intact, x-ray negative for fracture. Likely soft tissue injury, but will improve with rest and ibuprofen, patient placed in a knee sleeve and given crutches. Counseled on rice therapy. Follow-up with [place] [facility] in one week if knee pain not improved.  Pt also presents with nasal congestion and cough. Pt is well appearing and vitals are normal. Lungs CTA on exam. Pt CXR negative for acute infiltrate. Patients symptoms are consistent with URI, likely viral etiology. Discussed that antibiotics are not indicated for viral infections. Pt will be discharged with symptomatic treatment.  Verbalizes understanding and is agreeable with plan. Pt to follow-up with PCP, return precautions discussed. Pt is hemodynamically stable & in NAD prior to dc.   Final Clinical Impressions(s) / ED Diagnoses   Final diagnoses:  Viral URI with cough  Acute pain of right knee    ED Discharge Orders    None       Janet Berlin 01/23/17 1733    Duffy Bruce, MD 01/23/17 2049

## 2017-01-23 NOTE — Discharge Instructions (Signed)
Your knee x-ray shows no fracture. This pain will likely improve with a few days of ibuprofen, rest, elevation and ice. Use knee brace and crutches and try and stay off of knee as much as possible. If symptoms are not improving after one week please follow-up with Dr. Percell Miller with orthopedics.  Your other symptoms are likely caused by a viral upper respiratory infection. X-ray negative for pneumonia. Please continue to treat symptoms supportively, drink lots of fluids, ibuprofen and Tylenol for pain or fevers, nasal decongestants and over-the-counter cough medications are helpful. If symptoms are not improving please follow-up with your primary care provider.

## 2017-01-31 ENCOUNTER — Encounter (HOSPITAL_COMMUNITY): Payer: Self-pay

## 2017-01-31 ENCOUNTER — Other Ambulatory Visit: Payer: Self-pay

## 2017-01-31 DIAGNOSIS — M25551 Pain in right hip: Secondary | ICD-10-CM | POA: Insufficient documentation

## 2017-01-31 DIAGNOSIS — Z79899 Other long term (current) drug therapy: Secondary | ICD-10-CM | POA: Insufficient documentation

## 2017-01-31 DIAGNOSIS — I10 Essential (primary) hypertension: Secondary | ICD-10-CM | POA: Insufficient documentation

## 2017-01-31 DIAGNOSIS — D649 Anemia, unspecified: Secondary | ICD-10-CM | POA: Insufficient documentation

## 2017-01-31 DIAGNOSIS — M545 Low back pain: Secondary | ICD-10-CM | POA: Insufficient documentation

## 2017-01-31 DIAGNOSIS — Z87891 Personal history of nicotine dependence: Secondary | ICD-10-CM | POA: Insufficient documentation

## 2017-01-31 NOTE — ED Triage Notes (Addendum)
Pt endorses right hip pain since yesterday while working. Denies injuries. Pt able to ambulatory and has full ROM of hip. CMS intact and good peripheral pulse.

## 2017-02-01 ENCOUNTER — Emergency Department (HOSPITAL_COMMUNITY)
Admission: EM | Admit: 2017-02-01 | Discharge: 2017-02-01 | Disposition: A | Discharge status: Home / Self Care | Payer: PRIVATE HEALTH INSURANCE | Attending: Emergency Medicine | Admitting: Emergency Medicine

## 2017-02-01 DIAGNOSIS — M25551 Pain in right hip: Secondary | ICD-10-CM

## 2017-02-01 DIAGNOSIS — M545 Low back pain, unspecified: Secondary | ICD-10-CM

## 2017-02-01 LAB — URINALYSIS, ROUTINE W REFLEX MICROSCOPIC
BILIRUBIN URINE: NEGATIVE
Glucose, UA: NEGATIVE mg/dL
HGB URINE DIPSTICK: NEGATIVE
Ketones, ur: NEGATIVE mg/dL
Leukocytes, UA: NEGATIVE
Nitrite: NEGATIVE
PH: 5.5 (ref 5.0–8.0)
Protein, ur: NEGATIVE mg/dL
SPECIFIC GRAVITY, URINE: 1.025 (ref 1.005–1.030)

## 2017-02-01 MED ORDER — CYCLOBENZAPRINE HCL 10 MG PO TABS: 10.0000 mg | ORAL_TABLET | Freq: Two times a day (BID) | ORAL | 0 refills | Status: DC | PRN

## 2017-02-01 MED ORDER — IBUPROFEN 800 MG PO TABS: 800.0000 mg | ORAL_TABLET | Freq: Three times a day (TID) | ORAL | 0 refills | Status: DC

## 2017-02-01 NOTE — ED Provider Notes (Signed)
[facility] EMERGENCY DEPARTMENT Provider Note   CSN: [id number] Arrival date & time: 01/31/17  2119     History   Chief Complaint Chief Complaint  Patient presents with  . Hip Pain    HPI Kaitlyn Solomon is a 51 y.o. female.  Patient presents to the emergency department with a chief complaint of right flank pain.  She states symptoms started yesterday.  She denies any traumatic injury.  She states that it feels similar to when she had a prior kidney infection.  She reports urinary frequency and urgency, but denies any dysuria or hematuria.  She denies any numbness, weakness, or tingling of her lower extremities.  She denies any additional associated symptoms.   The history is provided by the patient. No language interpreter was used.    Past Medical History:  Diagnosis Date  . Anxiety state, unspecified   . Depressive disorder, not elsewhere classified   . Family history of breast cancer 07/05/2011   Pt states she was BRCA neg about 2009  . Fibroid tumor   . Irregular menses 2007  . Irritable bowel syndrome   . Unspecified essential hypertension   . Vitamin D deficiency 06/2011    Patient Active Problem List   Diagnosis Date Noted  . Migraine 12/04/2010  . BIPOLAR AFFECTIVE DISORDER, MANIC, HX OF 07/13/2009  . Anxiety state 06/17/2009  . DEPRESSION 06/17/2009  . HYPERTENSION 06/17/2009  . IRRITABLE BOWEL SYNDROME 06/17/2009  . HEADACHE, CHRONIC 06/17/2009  . ANEMIA 06/15/2009    Past Surgical History:  Procedure Laterality Date  . ABDOMINAL HYSTERECTOMY  08/2008  . DILATION AND CURETTAGE OF UTERUS    . WISDOM TOOTH EXTRACTION  1998    OB History    Gravida Para Term Preterm AB Living   '5 4 4 ' 0 1 4   SAB TAB Ectopic Multiple Live Births   1 0 0 0 4       Home Medications    Prior to Admission medications   Medication Sig Start Date End Date Taking? Authorizing Provider  cholecalciferol (VITAMIN D) 1000 UNITS tablet Take 1,000  Units by mouth daily.    [provider]  clonazePAM (KLONOPIN) 0.5 MG tablet Take 1 tablet (0.5 mg total) by mouth daily as needed for anxiety. 08/20/16   Lucille Passy, MD  cyclobenzaprine (FLEXERIL) 10 MG tablet Take 1 tablet (10 mg total) by mouth at bedtime as needed for muscle spasms. 09/25/16   Lucille Passy, MD  levothyroxine (SYNTHROID, LEVOTHROID) 75 MCG tablet Take 1 tablet (75 mcg total) by mouth daily. 07/30/16   Lucille Passy, MD  Multiple Vitamin (MULTIVITAMIN) tablet Take 1 tablet by mouth daily.    [provider]    Family History Family History  Problem Relation Age of Onset  . Breast cancer Mother 56  . Hypertension Mother   . Diabetes Brother   . Hypertension Brother   . Breast cancer Maternal Grandmother   . Diabetes Maternal Grandmother   . Hypertension Maternal Grandmother   . Breast cancer Maternal Aunt   . Diabetes Maternal Aunt   . Breast cancer Maternal Aunt   . Diabetes Cousin   . Diabetes Cousin   . Hypertension Brother     Social History Social History   Tobacco Use  . Smoking status: Former Smoker    Types: Cigarettes    Last attempt to quit: 01/23/1990    Years since quitting: 27.0  . Smokeless tobacco: Never Used  Substance Use Topics  . Alcohol use: Yes    Comment: occ  . Drug use: No     Allergies   Lamotrigine and Codeine   Review of Systems Review of Systems  All other systems reviewed and are negative.    Physical Exam Updated Vital Signs BP (!) 163/104 (BP Location: Right Arm)   Pulse 69   Temp 98.1 F (36.7 C) (Oral)   Resp 19   Ht 5' 1.5" (1.562 m)   Wt 72.6 kg (160 lb)   SpO2 100%   BMI 29.74 kg/m   Physical Exam  Constitutional: She is oriented to person, place, and time. She appears well-developed and well-nourished.  HENT:  Head: Normocephalic and atraumatic.  Eyes: Conjunctivae and EOM are normal. Pupils are equal, round, and reactive to light.  Neck: Normal range of motion. Neck supple.    Cardiovascular: Normal rate and regular rhythm. Exam reveals no gallop and no friction rub.  No murmur heard. Pulmonary/Chest: Effort normal and breath sounds normal. No respiratory distress. She has no wheezes. She has no rales. She exhibits no tenderness.  Abdominal: Soft. Bowel sounds are normal. She exhibits no distension and no mass. There is no tenderness. There is no rebound and no guarding.  No focal abdominal tenderness, no RLQ tenderness or pain at McBurney's point, no RUQ tenderness or Murphy's sign, no left-sided abdominal tenderness, no fluid wave, or signs of peritonitis   Musculoskeletal: Normal range of motion. She exhibits no edema or tenderness.  Neurological: She is alert and oriented to person, place, and time.  Sensation and strength intact  Skin: Skin is warm and dry.  Psychiatric: She has a normal mood and affect. Her behavior is normal. Judgment and thought content normal.  Nursing note and vitals reviewed.    ED Treatments / Results  Labs (all labs ordered are listed, but only abnormal results are displayed) Labs Reviewed  URINALYSIS, ROUTINE W REFLEX MICROSCOPIC    EKG  EKG Interpretation None       Radiology No results found.  Procedures Procedures (including critical care time)  Medications Ordered in ED Medications - No data to display   Initial Impression / Assessment and Plan / ED Course  I have reviewed the triage vital signs and the nursing notes.  Pertinent labs & imaging results that were available during my care of the patient were reviewed by me and considered in my medical decision making (see chart for details).     patient with right flank pain.  Reports associated urgency and frequency.  Will check urinalysis.  No history of kidney stones.  UA is negative.  Could be MSK, will treat with NSAIDs and muscle relaxer.  Able to ambulate.  No traumatic injury.  Final Clinical Impressions(s) / ED Diagnoses   Final diagnoses:   Acute right-sided low back pain without sciatica  Right hip pain    ED Discharge Orders    None       Montine Circle, PA-C 02/01/17 [place], [place], MD 02/01/17 [id number]

## 2017-02-01 NOTE — ED Notes (Signed)
Pt up to restroom to give a urine sample.

## 2017-02-01 NOTE — ED Notes (Signed)
Urine sent

## 2017-02-13 ENCOUNTER — Encounter (HOSPITAL_COMMUNITY): Payer: Self-pay | Admitting: Emergency Medicine

## 2017-02-13 ENCOUNTER — Emergency Department (HOSPITAL_COMMUNITY)
Admission: EM | Admit: 2017-02-13 | Discharge: 2017-02-13 | Disposition: A | Discharge status: Other Acute Inpatient Hospital | Payer: Self-pay | Attending: Emergency Medicine | Admitting: Emergency Medicine

## 2017-02-13 ENCOUNTER — Emergency Department (HOSPITAL_COMMUNITY): Payer: Self-pay

## 2017-02-13 DIAGNOSIS — Z87891 Personal history of nicotine dependence: Secondary | ICD-10-CM | POA: Insufficient documentation

## 2017-02-13 DIAGNOSIS — Z79899 Other long term (current) drug therapy: Secondary | ICD-10-CM | POA: Insufficient documentation

## 2017-02-13 DIAGNOSIS — Z803 Family history of malignant neoplasm of breast: Secondary | ICD-10-CM | POA: Insufficient documentation

## 2017-02-13 DIAGNOSIS — H53149 Visual discomfort, unspecified: Secondary | ICD-10-CM | POA: Insufficient documentation

## 2017-02-13 DIAGNOSIS — I609 Nontraumatic subarachnoid hemorrhage, unspecified: Secondary | ICD-10-CM | POA: Insufficient documentation

## 2017-02-13 DIAGNOSIS — E039 Hypothyroidism, unspecified: Secondary | ICD-10-CM | POA: Insufficient documentation

## 2017-02-13 DIAGNOSIS — I10 Essential (primary) hypertension: Secondary | ICD-10-CM | POA: Insufficient documentation

## 2017-02-13 DIAGNOSIS — Z885 Allergy status to narcotic agent status: Secondary | ICD-10-CM | POA: Insufficient documentation

## 2017-02-13 DIAGNOSIS — I607 Nontraumatic subarachnoid hemorrhage from unspecified intracranial artery: Secondary | ICD-10-CM

## 2017-02-13 LAB — BASIC METABOLIC PANEL
Anion gap: 8 (ref 5–15)
BUN: 14 mg/dL (ref 6–20)
CO2: 26 mmol/L (ref 22–32)
CREATININE: 0.76 mg/dL (ref 0.44–1.00)
Calcium: 9 mg/dL (ref 8.9–10.3)
Chloride: 101 mmol/L (ref 101–111)
GFR calc Af Amer: >60 mL/min (ref >60)
Glucose, Bld: 98 mg/dL (ref 65–99)
Potassium: 3.4 mmol/L — ABNORMAL LOW (ref 3.5–5.1)
SODIUM: 135 mmol/L (ref 135–145)

## 2017-02-13 LAB — CBC WITH DIFFERENTIAL/PLATELET
BAND NEUTROPHILS: 5 %
BASOS PCT: 0 %
Basophils Absolute: 0 10*3/uL (ref 0.0–0.1)
Blasts: 0 %
Eosinophils Absolute: 0 10*3/uL (ref 0.0–0.7)
Eosinophils Relative: 0 %
HCT: 41 % (ref 36.0–46.0)
Hemoglobin: 12.9 g/dL (ref 12.0–15.0)
LYMPHS ABS: 2.6 10*3/uL (ref 0.7–4.0)
LYMPHS PCT: 34 %
MCH: 20.7 pg — AB (ref 26.0–34.0)
MCHC: 31.5 g/dL (ref 30.0–36.0)
MCV: 65.8 fL — ABNORMAL LOW (ref 78.0–100.0)
MONO ABS: 0.5 10*3/uL (ref 0.1–1.0)
Metamyelocytes Relative: 0 %
Monocytes Relative: 7 %
Myelocytes: 0 %
NEUTROS PCT: 54 %
NRBC: 0 /100{WBCs}
Neutro Abs: 4.4 10*3/uL (ref 1.7–7.7)
OTHER: 0 %
PLATELETS: 317 10*3/uL (ref 150–400)
Promyelocytes Absolute: 0 %
RBC: 6.23 MIL/uL — ABNORMAL HIGH (ref 3.87–5.11)
RDW: 18.4 % — ABNORMAL HIGH (ref 11.5–15.5)
WBC: 7.5 10*3/uL (ref 4.0–10.5)

## 2017-02-13 LAB — PROTIME-INR
INR: 0.95
PROTHROMBIN TIME: 12.6 s (ref 11.4–15.2)

## 2017-02-13 LAB — ABO/RH: ABO/RH(D): O POS

## 2017-02-13 LAB — TYPE AND SCREEN
ABO/RH(D): O POS
ANTIBODY SCREEN: NEGATIVE

## 2017-02-13 LAB — APTT: aPTT: 31 seconds (ref 24–36)

## 2017-02-13 MED ORDER — ONDANSETRON HCL 4 MG/2ML IJ SOLN
4.0000 mg | Freq: Once | INTRAMUSCULAR | Status: AC
  Administered 2017-02-13: 4 mg via INTRAVENOUS
  Filled 2017-02-13: qty 2

## 2017-02-13 MED ORDER — HYDROMORPHONE HCL 1 MG/ML IJ SOLN
1.0000 mg | Freq: Once | INTRAMUSCULAR | Status: AC
  Administered 2017-02-13: 1 mg via INTRAVENOUS
  Filled 2017-02-13: qty 1

## 2017-02-13 MED ORDER — METOCLOPRAMIDE HCL 5 MG/ML IJ SOLN
10.0000 mg | Freq: Once | INTRAMUSCULAR | Status: DC
  Filled 2017-02-13: qty 2

## 2017-02-13 MED ORDER — DIPHENHYDRAMINE HCL 50 MG/ML IJ SOLN
25.0000 mg | Freq: Once | INTRAMUSCULAR | Status: DC
  Filled 2017-02-13: qty 1

## 2017-02-13 MED ORDER — SODIUM CHLORIDE 0.9 % IV BOLUS (SEPSIS)
1000.0000 mL | Freq: Once | INTRAVENOUS | Status: AC
  Administered 2017-02-13: 1000 mL via INTRAVENOUS

## 2017-02-13 MED ORDER — HYDROMORPHONE HCL 1 MG/ML IJ SOLN
0.5000 mg | Freq: Once | INTRAMUSCULAR | Status: AC
  Administered 2017-02-13: 0.5 mg via INTRAVENOUS
  Filled 2017-02-13: qty 1

## 2017-02-13 NOTE — ED Notes (Signed)
Contacted NeuroSurg at [facility] for Dr. Leonides Schanz

## 2017-02-13 NOTE — ED Notes (Signed)
[facility] called for transport to [facility]

## 2017-02-13 NOTE — ED Notes (Signed)
Daughter,Kaitlyn Solomon ,came to front desk asking to see her mother and we told her she had been transferred to [facility] hospital for a brain bleed , daughter stating she was very upset no one had called her to tell her anything had happened  and she had been transferred ,Sherle Poe  , house coverage,  Was with me when I told pt , I apologized profusely  And the daughter left to go to [facility]

## 2017-02-13 NOTE — ED Notes (Signed)
Spoke with MD Ward about patient, requested head CT.

## 2017-02-13 NOTE — ED Notes (Signed)
ekg done shown  To  Dr.Campos

## 2017-02-13 NOTE — ED Triage Notes (Signed)
Patient arrives with complaint of severe headache. States onset tonight around 2230 while working. States that headache lasted about 2 hours, but wasn't so intense. By the time patient left work around 1230, headache had subsided. Around 0100 headache returned and was severe. Describes pain as sharp piercing pain to the left side of her head. Migraine history, but hasn't had any in several years. States this is the worst headache ever experienced. No gross neuro symptoms in triage, denies nausea, photophobia and neck pain present.

## 2017-02-13 NOTE — ED Provider Notes (Signed)
TIME SEEN: 3:12 AM  CHIEF COMPLAINT: Severe headache  HPI: Patient is a 52 year old female with history of hypothyroidism, anxiety, migraines who presents to the emergency department with severe headache.  Described as diffuse, sharp, piercing pain.  Worse with movement.  Started at 10 PM and then improved but then suddenly worsened at 1 AM.  No head injury.  Not on antiplatelets or anticoagulants.  No numbness, tingling or focal weakness.  States this does not feel like her migraine headaches.  She has not suffered from a migraine in 3 years.  She has photophobia.  No nausea or vomiting.  No fever, neck pain or neck stiffness.  ROS: See HPI Constitutional: no fever  Eyes: no drainage  ENT: no runny nose   Cardiovascular:  no chest pain  Resp: no SOB  GI: no vomiting GU: no dysuria Integumentary: no rash  Allergy: no hives  Musculoskeletal: no leg swelling  Neurological: no slurred speech ROS otherwise negative  PAST MEDICAL HISTORY/PAST SURGICAL HISTORY:  Past Medical History:  Diagnosis Date  . Anxiety state, unspecified   . Depressive disorder, not elsewhere classified   . Family history of breast cancer 07/05/2011   Pt states she was BRCA neg about 2009  . Fibroid tumor   . Irregular menses 2007  . Irritable bowel syndrome   . Unspecified essential hypertension   . Vitamin D deficiency 06/2011    MEDICATIONS:  Prior to Admission medications   Medication Sig Start Date End Date Taking? Authorizing Provider  cholecalciferol (VITAMIN D) 1000 UNITS tablet Take 1,000 Units by mouth daily.    [provider]  clonazePAM (KLONOPIN) 0.5 MG tablet Take 1 tablet (0.5 mg total) by mouth daily as needed for anxiety. 08/20/16   Lucille Passy, MD  cyclobenzaprine (FLEXERIL) 10 MG tablet Take 1 tablet (10 mg total) by mouth 2 (two) times daily as needed for muscle spasms. 02/01/17   Montine Circle, PA-C  ibuprofen (ADVIL,MOTRIN) 800 MG tablet Take 1 tablet (800 mg total) by  mouth 3 (three) times daily. 02/01/17   Montine Circle, PA-C  levothyroxine (SYNTHROID, LEVOTHROID) 75 MCG tablet Take 1 tablet (75 mcg total) by mouth daily. 07/30/16   Lucille Passy, MD  Multiple Vitamin (MULTIVITAMIN) tablet Take 1 tablet by mouth daily.    [provider]    ALLERGIES:  Allergies  Allergen Reactions  . Lamotrigine Other (See Comments)    tremors   . Codeine Rash    SOCIAL HISTORY:  Social History   Tobacco Use  . Smoking status: Former Smoker    Types: Cigarettes    Last attempt to quit: 01/23/1990    Years since quitting: 27.0  . Smokeless tobacco: Never Used  Substance Use Topics  . Alcohol use: Yes    Comment: occ    FAMILY HISTORY: Family History  Problem Relation Age of Onset  . Breast cancer Mother 68  . Hypertension Mother   . Diabetes Brother   . Hypertension Brother   . Breast cancer Maternal Grandmother   . Diabetes Maternal Grandmother   . Hypertension Maternal Grandmother   . Breast cancer Maternal Aunt   . Diabetes Maternal Aunt   . Breast cancer Maternal Aunt   . Diabetes Cousin   . Diabetes Cousin   . Hypertension Brother     EXAM: BP (!) 192/110 (BP Location: Right Arm)   Pulse 72   Temp (!) 97.5 F (36.4 C) (Oral)   Resp 20   Ht 5'  2" (1.575 m)   Wt 70.3 kg (155 lb)   SpO2 100%   BMI 28.35 kg/m  CONSTITUTIONAL: Alert and oriented and responds appropriately to questions.  Appears uncomfortable, towel over her face, hypertensive here HEAD: Normocephalic EYES: Conjunctivae clear, pupils appear equal, EOMI, patient has photophobia ENT: normal nose; moist mucous membranes NECK: Supple, no meningismus, no nuchal rigidity, no LAD  CARD: RRR; S1 and S2 appreciated; no murmurs, no clicks, no rubs, no gallops RESP: Normal chest excursion without splinting or tachypnea; breath sounds clear and equal bilaterally; no wheezes, no rhonchi, no rales, no hypoxia or respiratory distress, speaking full sentences ABD/GI:  Normal bowel sounds; non-distended; soft, non-tender, no rebound, no guarding, no peritoneal signs, no hepatosplenomegaly BACK:  The back appears normal and is non-tender to palpation, there is no CVA tenderness EXT: Normal ROM in all joints; non-tender to palpation; no edema; normal capillary refill; no cyanosis, no calf tenderness or swelling    SKIN: Normal color for age and race; warm; no rash NEURO: Moves all extremities equally, strength 5/5 in all 4 extremities, cranial nerves II through XII intact, normal speech PSYCH: The patient's mood and manner are appropriate. Grooming and personal hygiene are appropriate.  MEDICAL DECISION MAKING: Patient here with sudden onset headache.  Started at 10 PM but suddenly worsened at 1 AM.  We will send her emergently to head CT.  I have called the CT technician who will take her next.  Will give IV medications for pain control.  ED PROGRESS: CT scan concerning for acute subarachnoid hemorrhage in the left basal and perimesencephalic cisterns with probable aneurysm at the basilar tip or left posterior communicating artery.  Consistent with ruptured intracranial aneurysm.  Nursing staff is currently placing a peripheral IV.  Will obtain labs and work on pain control and blood pressure control.  She is hypertensive here and denies a history of the same.  This may be secondary to pain.  Will consult neurosurgery emergently.  Patient will be kept n.p.o.   4:10 AM  D/w Phillips Odor PA on for neurosurgery.  Unfortunately at this time we do not have a physician that can perform the appropriate coiling of this aneurysm and therefore patient will need to be transferred to [facility].  I discussed this with patient and she is okay with this plan.  Her blood pressure has improved with IV pain medication.  I also consulted neurosurgery at [facility] and discussed with Dr. Grayland Ormond who is agreed to accept the patient in ED to ED transfer.   4:49 AM  Pt's repeat BP is  139/83.  Reports significant improvement in pain.  [facility] at bedside for transfer to [facility] ED.  I reviewed all nursing notes, vitals, pertinent previous records, EKGs, lab and urine results, imaging (as available).     CRITICAL CARE Performed by: Pryor Curia   Total critical care time: 45 minutes  Critical care time was exclusive of separately billable procedures and treating other patients.  Critical care was necessary to treat or prevent imminent or life-threatening deterioration.  Critical care was time spent personally by me on the following activities: development of treatment plan with patient and/or surrogate as well as nursing, discussions with consultants, evaluation of patient's response to treatment, examination of patient, obtaining history from patient or surrogate, ordering and performing treatments and interventions, ordering and review of laboratory studies, ordering and review of radiographic studies, pulse oximetry and re-evaluation of patient's condition.    Blythe Veach, Delice Bison, DO  02/13/17 0450  

## 2017-02-13 NOTE — ED Notes (Signed)
Patient transported to CT 

## 2017-03-01 ENCOUNTER — Other Ambulatory Visit: Payer: Self-pay

## 2017-03-01 MED ORDER — CLONAZEPAM 0.5 MG PO TABS: 0.5000 mg | ORAL_TABLET | Freq: Every day | ORAL | 1 refills | Status: DC | PRN

## 2017-03-01 MED ORDER — LEVOTHYROXINE SODIUM 75 MCG PO TABS: 75.0000 ug | ORAL_TABLET | Freq: Every day | ORAL | 1 refills | Status: DC

## 2017-03-29 ENCOUNTER — Telehealth: Payer: Self-pay | Admitting: Family Medicine

## 2017-03-29 MED ORDER — HYDRALAZINE HCL 25 MG PO TABS: 25.0000 mg | ORAL_TABLET | Freq: Three times a day (TID) | ORAL | 0 refills | Status: DC

## 2017-03-29 NOTE — Telephone Encounter (Signed)
It looks like she had a brain bleed and was seen in the ED but did not follow up with me.  Did she end up being seen at [facility]?  Has she been followed by neurology?.  Yes okay to refill one time only but needs to be seen ASAP.  Thank you.

## 2017-03-29 NOTE — Telephone Encounter (Signed)
Rx already sent in, see other phone note.

## 2017-03-29 NOTE — Telephone Encounter (Signed)
Pt seen in [facility] ED 02/13/17; SAH.  Transferred to [facility] due to neurosurgeon unavailable. Pt called today to report at discharge from [facility] she was prescribed Hydralazine 25 mg and will be out of medication tomorrow.  Requesting Dr. Deborra Medina prescribe enough of the medication until her appt on 04/08/17.  [phone]  May leave message.  [facility], [place] [phone]

## 2017-03-29 NOTE — Telephone Encounter (Signed)
Copied from [place]. Topic: General - Other >> Mar 29, 2017 12:14 PM Darl Householder, RMA wrote: Reason for CRM: Medication refill request for Hydralazine 25 mg to be sent to [facility] [address], (this medication is not on pt meds list, pt states it was prescribed by hospital and she needs refill)

## 2017-03-29 NOTE — Telephone Encounter (Signed)
Sent in Rx #90 only same SIG as [facility]/tried to call the pt 3 times no ring and no answer/thx dmf

## 2017-03-29 NOTE — Telephone Encounter (Signed)
Yes please see previous phone note.  Okay to refill one time only.

## 2017-04-08 ENCOUNTER — Ambulatory Visit: Payer: Self-pay | Admitting: Family Medicine

## 2017-04-08 ENCOUNTER — Encounter: Payer: Self-pay | Admitting: Family Medicine

## 2017-04-08 VITALS — BP 138/74 | HR 76 | Temp 98.5°F | Ht 61.0 in | Wt 153.2 lb

## 2017-04-08 DIAGNOSIS — E041 Nontoxic single thyroid nodule: Secondary | ICD-10-CM

## 2017-04-08 DIAGNOSIS — Z23 Encounter for immunization: Secondary | ICD-10-CM

## 2017-04-08 DIAGNOSIS — Z1231 Encounter for screening mammogram for malignant neoplasm of breast: Secondary | ICD-10-CM

## 2017-04-08 DIAGNOSIS — Z1239 Encounter for other screening for malignant neoplasm of breast: Secondary | ICD-10-CM

## 2017-04-08 DIAGNOSIS — I609 Nontraumatic subarachnoid hemorrhage, unspecified: Secondary | ICD-10-CM | POA: Insufficient documentation

## 2017-04-08 DIAGNOSIS — I10 Essential (primary) hypertension: Secondary | ICD-10-CM

## 2017-04-08 MED ORDER — HYDROCODONE-ACETAMINOPHEN 5-325 MG PO TABS: 1.0000 | ORAL_TABLET | Freq: Four times a day (QID) | ORAL | 0 refills | Status: AC | PRN

## 2017-04-08 MED ORDER — LEVOTHYROXINE SODIUM 75 MCG PO TABS: 75.0000 ug | ORAL_TABLET | Freq: Every day | ORAL | 1 refills | Status: DC

## 2017-04-08 MED ORDER — CLONAZEPAM 0.5 MG PO TABS: 0.5000 mg | ORAL_TABLET | Freq: Every day | ORAL | 1 refills | Status: DC | PRN

## 2017-04-08 NOTE — Assessment & Plan Note (Addendum)
Did not require surgery. Followed by neurosurgery at [facility]. BP is under reasonable control. Still having headaches. Short course of norco given to pt- #20.

## 2017-04-08 NOTE — Progress Notes (Signed)
Subjective:   Patient ID: Kaitlyn Solomon, female    DOB: 07-Jun-1965, 52 y.o.   MRN: [id number]  Kaitlyn Solomon is a pleasant 52 y.o. year old female who presents to clinic today with Hospitalization Follow-up (Patient is here today for a Hospital F/U.  She was admitted to [facility] 1.2.19 and Dx with Subarachnoid Hemorrhage which did not result in surgery but says "just a whole lot of pain."  She is also being followed by an ENT as of 1.23.19 by an ENT for Thyroid Nodule which surgery will be scheduled.  She would like to get her Flu & Tdap today.  She needs referral for Mammogram across from [facility] and she will call them in 3 months to schedule after the current stuff is completed.)  on 04/08/2017  HPI:  Presented to [facility] ED with severe headache- CT showed acute subarachnoid hemorrhage in the left basal and perimesencephalic cisterns with a probable aneurysm at the basilar tip or left posterior communicating artery, consistent with probable ruptured aneurysm.  Was immediately transferred to [facility]. Notes reviewed.  Take to OR for diagnostic cerebral angiogram.  Angiogram and CTA neg- confirmed it was not a ruptured aneurysm as first suspected. She was discharged home on hydralazine. Was on keppra which was d/'d. Advised to follow up with NSG.  Thyroid nodule- 1.7 cm thyroid nodules was also seen on CT from 02/17/17. Was seen by ENT at [facility].  Advised she needs surgery/FNA but she has not scheduled this but she will.  Has not had a headache in over a week.    EXAM: CT HEAD WITHOUT CONTRAST  TECHNIQUE: Contiguous axial images were obtained from the base of the skull through the vertex without intravenous contrast.  COMPARISON:  02/12/2015  FINDINGS: Brain: Increased density in the subarachnoid spaces along the left basal cisterns consistent with acute subarachnoid hemorrhage. Blood tracks down and the CSF spaces along the anterior brainstem. There is mild mass effect with  effacement of the basal cisterns on the left. No ventricular dilatation. Gray-white matter junctions are distinct. No abnormal extra-axial fluid collections otherwise demonstrated.  Vascular: 7 mm diameter rounded area of increased density likely representing an aneurysm at the basilar tip or left posterior communicating artery origin.  Skull: Calvarium appears intact.  Sinuses/Orbits: Paranasal sinuses and mastoid air cells are clear.  Other: None.  IMPRESSION: Acute subarachnoid hemorrhage in the left basal and perimesencephalic cisterns with a probable aneurysm at the basilar tip or left posterior communicating artery origin. Changes are consistent with ruptured intracranial aneurysm.  These results were called by telephone at the time of interpretation on 02/13/2017 at 3:47 am to Dr. Pryor Curia , who verbally acknowledged these results.   Electronically Signed   By: Lucienne Capers M.D.   On: 02/13/2017 03:50  Current Outpatient Medications on File Prior to Visit  Medication Sig Dispense Refill  . cholecalciferol (VITAMIN D) 1000 UNITS tablet Take 1,000 Units by mouth daily.    . clonazePAM (KLONOPIN) 0.5 MG tablet Take 1 tablet (0.5 mg total) by mouth daily as needed for anxiety. 30 tablet 1  . cyclobenzaprine (FLEXERIL) 10 MG tablet Take 1 tablet (10 mg total) by mouth 2 (two) times daily as needed for muscle spasms. 20 tablet 0  . hydrALAZINE (APRESOLINE) 25 MG tablet Take 1 tablet (25 mg total) by mouth every 8 (eight) hours. 90 tablet 0  . ibuprofen (ADVIL,MOTRIN) 200 MG tablet Take 200 mg by mouth every 6 (six) hours as needed. Take  1-2 tabs tid prn    . levothyroxine (SYNTHROID, LEVOTHROID) 75 MCG tablet Take 1 tablet (75 mcg total) by mouth daily. 30 tablet 1  . Multiple Vitamin (MULTIVITAMIN) tablet Take 1 tablet by mouth daily.     No current facility-administered medications on file prior to visit.     Allergies  Allergen Reactions  . Lamotrigine  Other (See Comments)    tremors   . Codeine Rash    Past Medical History:  Diagnosis Date  . Anxiety state, unspecified   . Depressive disorder, not elsewhere classified   . Family history of breast cancer 07/05/2011   Pt states she was BRCA neg about 2009  . Fibroid tumor   . Irregular menses 2007  . Irritable bowel syndrome   . Unspecified essential hypertension   . Vitamin D deficiency 06/2011    Past Surgical History:  Procedure Laterality Date  . ABDOMINAL HYSTERECTOMY  08/2008  . DILATION AND CURETTAGE OF UTERUS    . WISDOM TOOTH EXTRACTION  1998    Family History  Problem Relation Age of Onset  . Breast cancer Mother 60  . Hypertension Mother   . Diabetes Brother   . Hypertension Brother   . Breast cancer Maternal Grandmother   . Diabetes Maternal Grandmother   . Hypertension Maternal Grandmother   . Breast cancer Maternal Aunt   . Diabetes Maternal Aunt   . Breast cancer Maternal Aunt   . Diabetes Cousin   . Diabetes Cousin   . Hypertension Brother     Social History   Socioeconomic History  . Marital status: Divorced    Spouse name: Not on file  . Number of children: Not on file  . Years of education: Not on file  . Highest education level: Not on file  Social Needs  . Financial resource strain: Not on file  . Food insecurity - worry: Not on file  . Food insecurity - inability: Not on file  . Transportation needs - medical: Not on file  . Transportation needs - non-medical: Not on file  Occupational History  . Occupation: Building control surveyor: UNEMPLOYED  Tobacco Use  . Smoking status: Former Smoker    Types: Cigarettes    Last attempt to quit: 01/23/1990    Years since quitting: 27.2  . Smokeless tobacco: Never Used  Substance and Sexual Activity  . Alcohol use: Yes    Comment: occ  . Drug use: No  . Sexual activity: Not Currently    Birth control/protection: Surgical  Other Topics Concern  . Not on file  Social History  Narrative   Regular exercise: walks a lot at work   Caffeine use: yes   The PMH, PSH, Social History, Family History, Medications, and allergies have been reviewed in [facility], and have been updated if relevant.  Review of Systems  Constitutional: Negative.   HENT: Negative.   Respiratory: Negative.   Cardiovascular: Negative.   Genitourinary: Negative.   Musculoskeletal: Negative.   Neurological: Positive for headaches. Negative for dizziness, tremors, seizures, syncope, facial asymmetry, speech difficulty, weakness, light-headedness and numbness.  Psychiatric/Behavioral: Negative.   All other systems reviewed and are negative.      Objective:    BP 138/74 (BP Location: Left Arm, Patient Position: Sitting, Cuff Size: Normal)   Pulse 76   Temp 98.5 F (36.9 C) (Oral)   Ht _0  (1.549 m)   Wt 153 lb 3.2 oz (69.5 kg)   SpO2 98%  BMI 28.95 kg/m    Physical Exam  Constitutional: She is oriented to person, place, and time. She appears well-developed and well-nourished. No distress.  HENT:  Head: Normocephalic and atraumatic.  Eyes: Conjunctivae are normal.  Cardiovascular: Normal rate.  Pulmonary/Chest: Effort normal.  Musculoskeletal: Normal range of motion.  Neurological: She is alert and oriented to person, place, and time.  Skin: Skin is warm. She is not diaphoretic.  Psychiatric: She has a normal mood and affect. Her behavior is normal. Judgment and thought content normal.  Nursing note and vitals reviewed.         Assessment & Plan:   Subarachnoid hemorrhage ([place])  Need for influenza vaccination - Plan: Flu Vaccine QUAD 6+ mos PF IM (Fluarix Quad PF)  Need for Tdap vaccination - Plan: Tdap vaccine greater than or equal to 7yo IM  Thyroid nodule  Essential hypertension  Screening for breast cancer - Plan: MM Digital Screening No Follow-up on file.

## 2017-04-08 NOTE — Assessment & Plan Note (Signed)
Followed by ENT. Surgery/ FNA pending.

## 2017-04-08 NOTE — Patient Instructions (Signed)
Great to see you.  Please call the breast center at [phone] to schedule your mammogram.  Please keep your appointments with neurosurgery and ENT.

## 2017-04-08 NOTE — Assessment & Plan Note (Signed)
Reasonable control. No changes made. 

## 2017-04-08 NOTE — Progress Notes (Signed)
1 

## 2017-05-08 ENCOUNTER — Telehealth: Payer: Self-pay | Admitting: Family Medicine

## 2017-05-08 NOTE — Telephone Encounter (Signed)
Patient expressed there must be some kind of misunderstanding about this prescription.  She received 90 pills in the last prescription and the instructions are to take one every 8 hours.  That is a one month supply.  So she doesn't know why it would be too soon to ask for a refill.

## 2017-05-08 NOTE — Telephone Encounter (Signed)
Patient called, left detailed VM of the prescription for Hydralazine was sent to [facility] on 03/29/17 90 tab/0 refills. It is too early to request a refill. Advised her to call the pharmacy to obtain the prescription, if it was not picked up. Advised to call back to the office with questions or concerns.

## 2017-05-08 NOTE — Telephone Encounter (Signed)
Copied from [place] [id number]. Topic: Quick Communication - Rx Refill/Question >> May 08, 2017  4:05 PM Oliver Pila B wrote: Medication: hydrALAZINE (APRESOLINE) 25 MG tablet [[id number]]  Has the patient contacted their pharmacy? Yes.   (Agent: If no, request that the patient contact the pharmacy for the refill.) [facility]  Agent: Please be advised that RX refills may take up to 3 business days. We ask that you follow-up with your pharmacy.

## 2017-05-09 ENCOUNTER — Other Ambulatory Visit: Payer: Self-pay

## 2017-05-09 MED ORDER — HYDRALAZINE HCL 25 MG PO TABS: 25.0000 mg | ORAL_TABLET | Freq: Three times a day (TID) | ORAL | 0 refills | Status: DC

## 2017-05-09 NOTE — Telephone Encounter (Signed)
Patient is calling back states she only has 2 days worth of medication left. Please advise.  [facility] [place] - [address]  [place] [place] [address]  Phone: [phone] Fax: [phone]

## 2017-05-09 NOTE — Telephone Encounter (Signed)
Approved 30d supply/thx dmf

## 2017-05-09 NOTE — Telephone Encounter (Signed)
*   routed to the wrong practice in error *  Dr. Deborra Medina pt.

## 2017-05-13 ENCOUNTER — Other Ambulatory Visit: Payer: Self-pay | Admitting: Family Medicine

## 2017-05-13 DIAGNOSIS — Z1231 Encounter for screening mammogram for malignant neoplasm of breast: Secondary | ICD-10-CM

## 2017-07-03 ENCOUNTER — Encounter: Payer: Self-pay | Admitting: Family Medicine

## 2017-07-03 ENCOUNTER — Ambulatory Visit (INDEPENDENT_AMBULATORY_CARE_PROVIDER_SITE_OTHER): Payer: 59 | Admitting: Family Medicine

## 2017-07-03 VITALS — BP 134/90 | HR 72 | Temp 98.8°F | Ht 61.0 in | Wt 146.2 lb

## 2017-07-03 DIAGNOSIS — I609 Nontraumatic subarachnoid hemorrhage, unspecified: Secondary | ICD-10-CM | POA: Diagnosis not present

## 2017-07-03 DIAGNOSIS — F339 Major depressive disorder, recurrent, unspecified: Secondary | ICD-10-CM | POA: Diagnosis not present

## 2017-07-03 DIAGNOSIS — E041 Nontoxic single thyroid nodule: Secondary | ICD-10-CM

## 2017-07-03 MED ORDER — VENLAFAXINE HCL ER 75 MG PO CP24: 75.0000 mg | ORAL_CAPSULE | Freq: Every day | ORAL | 3 refills | Status: DC

## 2017-07-03 MED ORDER — TRAZODONE HCL 50 MG PO TABS: 25.0000 mg | ORAL_TABLET | Freq: Every evening | ORAL | 3 refills | Status: DC | PRN

## 2017-07-03 MED ORDER — HYDRALAZINE HCL 25 MG PO TABS: 25.0000 mg | ORAL_TABLET | Freq: Three times a day (TID) | ORAL | 0 refills | Status: DC

## 2017-07-03 MED ORDER — LEVOTHYROXINE SODIUM 75 MCG PO TABS: 75.0000 ug | ORAL_TABLET | Freq: Every day | ORAL | 1 refills | Status: DC

## 2017-07-03 NOTE — Patient Instructions (Addendum)
Great to see you. Please call neurosurgery TODAY to schedule a follow up.  We are starting Effexor and trazodone.  Please update me in a few weeks.

## 2017-07-03 NOTE — Assessment & Plan Note (Signed)
Deteriorated. PHQ9 of 17. >25 minutes spent in face to face time with patient, >50% spent in counselling or coordination of care She does not want to go back to psychiatry.  She feels emotionally she is in a better place. Has better insight into her emotions and doing more self care. Restart Effexor, trazodone. She will update me in a few weeks.

## 2017-07-03 NOTE — Assessment & Plan Note (Signed)
Discussed the importance of following up with NSG ASAP.  She says she will call today to schedule follow up.

## 2017-07-03 NOTE — Progress Notes (Signed)
Subjective:   Patient ID: Kaitlyn Solomon, female    DOB: 03-Dec-1965, 52 y.o.   MRN: [id number]  Defne Gerling Messerschmidt is a pleasant 52 y.o. year old female who presents to clinic today with Follow-up (Patient is here today to F/U with Subarachnoid Hemorrhage Dx @ [facility] on 1.2.19.   She came in for a hosptial F/U on 2.25.19.  Today she state that she is having headaches that are the same as before as a piercing pain in the back of head most of the time on the left.) and Anxiety (She states that she has been going through quite a bit lately and is not dealing with her stress the way she used to.  Still has not had her thyroid surgery yet either. )  on 07/03/2017  HPI:  [facility]- diagnosed at [facility] on 02/13/17.   Angiogram and CTA neg- confirmed it was not a ruptured aneurysm as first suspected. She was discharged home on hydralazine. Was on keppra which was d/'d. Advised to follow up with NSG. Has not followed up.  Still having headaches- but she feels these are more tension related.  Thyroid nodule- 1.7 cm thyroid nodules was also seen on CT from 02/17/17. Was seen by ENT at [facility].  Advised she needs surgery/FNA.  She has still not scheduled this.   Anxiety- Feels like she is going through a lot.  Feels more tearful.  No SI or HI. Doing more self care- journal, trying to exercise. Uses as needed Klonipin but feels she needs to restart an SSRI.   Current Outpatient Medications on File Prior to Visit  Medication Sig Dispense Refill  . cholecalciferol (VITAMIN D) 1000 UNITS tablet Take 1,000 Units by mouth daily.    . clonazePAM (KLONOPIN) 0.5 MG tablet Take 1 tablet (0.5 mg total) by mouth daily as needed for anxiety. 30 tablet 1  . cyclobenzaprine (FLEXERIL) 10 MG tablet Take 1 tablet (10 mg total) by mouth 2 (two) times daily as needed for muscle spasms. 20 tablet 0  . ibuprofen (ADVIL,MOTRIN) 200 MG tablet Take 200 mg by mouth every 6 (six) hours as needed. Take 1-2 tabs tid prn    . Multiple  Vitamin (MULTIVITAMIN) tablet Take 1 tablet by mouth daily.     No current facility-administered medications on file prior to visit.     Allergies  Allergen Reactions  . Lamotrigine Other (See Comments)    tremors   . Codeine Rash    Past Medical History:  Diagnosis Date  . Anxiety state, unspecified   . Depressive disorder, not elsewhere classified   . Family history of breast cancer 07/05/2011   Pt states she was BRCA neg about 2009  . Fibroid tumor   . Irregular menses 2007  . Irritable bowel syndrome   . Unspecified essential hypertension   . Vitamin D deficiency 06/2011    Past Surgical History:  Procedure Laterality Date  . ABDOMINAL HYSTERECTOMY  08/2008  . DILATION AND CURETTAGE OF UTERUS    . WISDOM TOOTH EXTRACTION  1998    Family History  Problem Relation Age of Onset  . Breast cancer Mother 25  . Hypertension Mother   . Diabetes Brother   . Hypertension Brother   . Breast cancer Maternal Grandmother   . Diabetes Maternal Grandmother   . Hypertension Maternal Grandmother   . Breast cancer Maternal Aunt   . Diabetes Maternal Aunt   . Breast cancer Maternal Aunt   . Diabetes Cousin   .  Diabetes Cousin   . Hypertension Brother     Social History   Socioeconomic History  . Marital status: Divorced    Spouse name: Not on file  . Number of children: Not on file  . Years of education: Not on file  . Highest education level: Not on file  Occupational History  . Occupation: Building control surveyor: UNEMPLOYED  Social Needs  . Financial resource strain: Not on file  . Food insecurity:    Worry: Not on file    Inability: Not on file  . Transportation needs:    Medical: Not on file    Non-medical: Not on file  Tobacco Use  . Smoking status: Former Smoker    Types: Cigarettes    Last attempt to quit: 01/23/1990    Years since quitting: 27.4  . Smokeless tobacco: Never Used  Substance and Sexual Activity  . Alcohol use: Yes    Comment:  occ  . Drug use: No  . Sexual activity: Not Currently    Birth control/protection: Surgical  Lifestyle  . Physical activity:    Days per week: Not on file    Minutes per session: Not on file  . Stress: Not on file  Relationships  . Social connections:    Talks on phone: Not on file    Gets together: Not on file    Attends religious service: Not on file    Active member of club or organization: Not on file    Attends meetings of clubs or organizations: Not on file    Relationship status: Not on file  . Intimate partner violence:    Fear of current or ex partner: Not on file    Emotionally abused: Not on file    Physically abused: Not on file    Forced sexual activity: Not on file  Other Topics Concern  . Not on file  Social History Narrative   Regular exercise: walks a lot at work   Caffeine use: yes   The PMH, PSH, Social History, Family History, Medications, and allergies have been reviewed in [facility], and have been updated if relevant.  Review of Systems  Neurological: Positive for headaches. Negative for dizziness, tremors, seizures, syncope, facial asymmetry, speech difficulty, weakness, light-headedness and numbness.  Psychiatric/Behavioral: Positive for decreased concentration, dysphoric mood and sleep disturbance. Negative for agitation, behavioral problems, confusion, hallucinations, self-injury and suicidal ideas. The patient is nervous/anxious. The patient is not hyperactive.   All other systems reviewed and are negative.      Objective:    BP 134/90 (BP Location: Left Arm, Patient Position: Sitting, Cuff Size: Normal)   Pulse 72   Temp 98.8 F (37.1 C) (Oral)   Ht '5\' 1"'  (1.549 m)   Wt 146 lb 3.2 oz (66.3 kg)   SpO2 98%   BMI 27.62 kg/m    Physical Exam  Constitutional: She is oriented to person, place, and time. She appears well-developed and well-nourished. No distress.  HENT:  Head: Normocephalic and atraumatic.  Eyes: EOM are normal.  Neck: Normal  range of motion.  Cardiovascular: Normal rate.  Pulmonary/Chest: Effort normal.  Musculoskeletal: Normal range of motion.  Neurological: She is alert and oriented to person, place, and time.  Skin: Skin is warm. She is not diaphoretic.  Psychiatric: She has a normal mood and affect. Her behavior is normal. Judgment and thought content normal.  Nursing note and vitals reviewed.         Assessment &  Plan:   Subarachnoid hemorrhage ([facility])  Thyroid nodule  Depression, recurrent (HCC) No follow-ups on file.

## 2017-08-22 ENCOUNTER — Other Ambulatory Visit: Payer: Self-pay | Admitting: Family Medicine

## 2017-09-04 ENCOUNTER — Telehealth: Payer: Self-pay | Admitting: Family Medicine

## 2017-09-04 NOTE — Telephone Encounter (Signed)
Copied from [place] [id number]. Topic: Quick Communication - Rx Refill/Question >> Sep 04, 2017  4:09 PM Wynetta Emery, [place] C wrote: Medication: hydrALAZINE (APRESOLINE) 25 MG tablet and also Xanax   Has the patient contacted their pharmacy?  Yes   (Agent: If no, request that the patient contact the pharmacy for the refill.) (Agent: If yes, when and what did the pharmacy advise?)  Preferred Pharmacy (with phone number or street name): [facility] [phone] - [place], [place] - [address] AT [phone] (Phone) [phone] (Fax)      Agent: Please be advised that RX refills may take up to 3 business days. We ask that you follow-up with your pharmacy.

## 2017-09-06 ENCOUNTER — Other Ambulatory Visit: Payer: Self-pay | Admitting: *Deleted

## 2017-09-06 MED ORDER — HYDRALAZINE HCL 25 MG PO TABS: 25.0000 mg | ORAL_TABLET | Freq: Three times a day (TID) | ORAL | 0 refills | Status: DC

## 2017-09-06 NOTE — Telephone Encounter (Signed)
Rx refill request: hydralazine 25 mg refilled per protocol  LOV: 04/08/17- to continue  Rx refill request: Xanax   - not on medication list as current medication  LOV: 07/03/17  PCP: Ramer: verified

## 2017-09-06 NOTE — Telephone Encounter (Signed)
Called pharmacy/pt is taking Clonazepam 0.5mg /this Rx was faxed over on 7.11.19 for #30+2 refills/they did not receive it/I gave verbal/thx dmf

## 2017-09-14 ENCOUNTER — Other Ambulatory Visit: Payer: Self-pay

## 2017-09-14 ENCOUNTER — Encounter (HOSPITAL_COMMUNITY): Payer: Self-pay | Admitting: *Deleted

## 2017-09-14 ENCOUNTER — Emergency Department (HOSPITAL_COMMUNITY)
Admission: EM | Admit: 2017-09-14 | Discharge: 2017-09-14 | Disposition: A | Discharge status: Home / Self Care | Payer: 59 | Attending: Emergency Medicine | Admitting: Emergency Medicine

## 2017-09-14 DIAGNOSIS — R202 Paresthesia of skin: Secondary | ICD-10-CM | POA: Insufficient documentation

## 2017-09-14 DIAGNOSIS — R51 Headache: Secondary | ICD-10-CM | POA: Insufficient documentation

## 2017-09-14 DIAGNOSIS — Z5321 Procedure and treatment not carried out due to patient leaving prior to being seen by health care provider: Secondary | ICD-10-CM | POA: Insufficient documentation

## 2017-09-14 NOTE — ED Notes (Signed)
Patient requested to leave without being seen. Charge made aware.

## 2017-09-14 NOTE — ED Triage Notes (Signed)
The pt is c/o a headache for 2 days.  She is also c/o a tingling in the rt side of her head  Hx of ?? Bleeding in her head in January treated at [facility]

## 2017-09-16 NOTE — ED Notes (Signed)
Follow up call made  No answer 09/16/17  1037  s Rodric Punch rn

## 2017-12-04 ENCOUNTER — Ambulatory Visit: Payer: 59 | Admitting: Family Medicine

## 2017-12-16 ENCOUNTER — Other Ambulatory Visit: Payer: Self-pay | Admitting: Family Medicine

## 2018-01-23 ENCOUNTER — Encounter (HOSPITAL_COMMUNITY): Payer: Self-pay | Admitting: Emergency Medicine

## 2018-01-23 ENCOUNTER — Ambulatory Visit: Payer: 59 | Admitting: Family Medicine

## 2018-01-23 ENCOUNTER — Emergency Department (HOSPITAL_COMMUNITY)
Admission: EM | Admit: 2018-01-23 | Discharge: 2018-01-23 | Disposition: A | Discharge status: Home / Self Care | Payer: 59 | Attending: Emergency Medicine | Admitting: Emergency Medicine

## 2018-01-23 ENCOUNTER — Encounter: Payer: Self-pay | Admitting: Family Medicine

## 2018-01-23 ENCOUNTER — Other Ambulatory Visit: Payer: Self-pay

## 2018-01-23 ENCOUNTER — Ambulatory Visit: Payer: Self-pay

## 2018-01-23 VITALS — BP 172/110 | HR 68 | Temp 98.3°F | Ht 61.0 in | Wt 144.4 lb

## 2018-01-23 DIAGNOSIS — M545 Low back pain: Secondary | ICD-10-CM | POA: Insufficient documentation

## 2018-01-23 DIAGNOSIS — R42 Dizziness and giddiness: Secondary | ICD-10-CM

## 2018-01-23 DIAGNOSIS — Z23 Encounter for immunization: Secondary | ICD-10-CM

## 2018-01-23 DIAGNOSIS — Z5321 Procedure and treatment not carried out due to patient leaving prior to being seen by health care provider: Secondary | ICD-10-CM | POA: Insufficient documentation

## 2018-01-23 DIAGNOSIS — E041 Nontoxic single thyroid nodule: Secondary | ICD-10-CM

## 2018-01-23 DIAGNOSIS — I10 Essential (primary) hypertension: Secondary | ICD-10-CM

## 2018-01-23 DIAGNOSIS — I159 Secondary hypertension, unspecified: Secondary | ICD-10-CM

## 2018-01-23 DIAGNOSIS — E039 Hypothyroidism, unspecified: Secondary | ICD-10-CM

## 2018-01-23 MED ORDER — LEVOTHYROXINE SODIUM 75 MCG PO TABS: 75.0000 ug | ORAL_TABLET | Freq: Every day | ORAL | 1 refills | Status: DC

## 2018-01-23 MED ORDER — HYDRALAZINE HCL 25 MG PO TABS: 25.0000 mg | ORAL_TABLET | Freq: Three times a day (TID) | ORAL | 0 refills | Status: DC

## 2018-01-23 NOTE — Progress Notes (Signed)
Subjective:   Patient ID: TACHE BOBST, female    DOB: 1965-09-03, 52 y.o.   MRN: [id number]  Kaitlyn Solomon is a pleasant 52 y.o. year old female who presents to clinic today with Hypertension (Per nurse triage call pt went to ED for back spasms.  Her BP was 201/116 but she left witthouttelling anyone.  States missed a couple of doses of hydralazine. Denied CP, blurred vision, SOB or weakness.  At [facility] she checked it and it was 219/126 then the pharmacist rechecked it was 207/116.  She states that she has been out of her Synthroid for 5-weeks-now. Has been getting dizzy at the gym.)  on 01/23/2018  HPI:  HTN- went to ER this morning for back pain. BP there was 201/116.  She left without being seen.  Reports missing a couple of doses of hydralazine- upon further questioning, she has not been taking hydralazine for almost a week.  Denies CP, blurred vision, CP or SOB. She is supposed to be taking Hydralazine 25 mg three times daily. Lab Results  Component Value Date   CREATININE 0.76 02/13/2017    Hypothyroidism with thyroid nodule- followed by endocrinology at [facility] Vicie Mutters).  Has not bee seen in almost a year. Has also been out of her synthroid for 5 weeks now.  Dizzy at the gym.  She should be taking synthroid 75 mcg daily.  Never called back to schedule an appointment with endocrinology.  She would like to follow up with an endocrinologist in [place] instead. Lab Results  Component Value Date   TSH 1.66 08/20/2016    Current Outpatient Medications on File Prior to Visit  Medication Sig Dispense Refill  . cholecalciferol (VITAMIN D) 1000 UNITS tablet Take 1,000 Units by mouth daily.    . clonazePAM (KLONOPIN) 0.5 MG tablet TAKE 1 TABLET BY MOUTH DAILY AS NEEDED FOR ANXIETY 30 tablet 2  . hydrALAZINE (APRESOLINE) 25 MG tablet Take 1 tablet (25 mg total) by mouth every 8 (eight) hours. 90 tablet 0  . ibuprofen (ADVIL,MOTRIN) 200 MG tablet Take 200 mg by mouth every 6  (six) hours as needed. Take 1-2 tabs tid prn    . Multiple Vitamin (MULTIVITAMIN) tablet Take 1 tablet by mouth daily.    . traZODone (DESYREL) 50 MG tablet Take 0.5-1 tablets (25-50 mg total) by mouth at bedtime as needed for sleep. 30 tablet 3  . venlafaxine XR (EFFEXOR-XR) 75 MG 24 hr capsule TAKE 1 CAPSULE(75 MG) BY MOUTH DAILY WITH BREAKFAST 30 capsule 3  . cyclobenzaprine (FLEXERIL) 10 MG tablet Take 1 tablet (10 mg total) by mouth 2 (two) times daily as needed for muscle spasms. (Patient not taking: Reported on 01/23/2018) 20 tablet 0  . levothyroxine (SYNTHROID, LEVOTHROID) 75 MCG tablet Take 1 tablet (75 mcg total) by mouth daily. (Patient not taking: Reported on 01/23/2018) 30 tablet 1   No current facility-administered medications on file prior to visit.     Allergies  Allergen Reactions  . Lamotrigine Other (See Comments)    tremors   . Codeine Rash    Past Medical History:  Diagnosis Date  . Anxiety state, unspecified   . Depressive disorder, not elsewhere classified   . Family history of breast cancer 07/05/2011   Pt states she was BRCA neg about 2009  . Fibroid tumor   . Irregular menses 2007  . Irritable bowel syndrome   . Unspecified essential hypertension   . Vitamin D deficiency 06/2011    Past Surgical  History:  Procedure Laterality Date  . ABDOMINAL HYSTERECTOMY  08/2008  . DILATION AND CURETTAGE OF UTERUS    . WISDOM TOOTH EXTRACTION  1998    Family History  Problem Relation Age of Onset  . Breast cancer Mother 29  . Hypertension Mother   . Diabetes Brother   . Hypertension Brother   . Breast cancer Maternal Grandmother   . Diabetes Maternal Grandmother   . Hypertension Maternal Grandmother   . Breast cancer Maternal Aunt   . Diabetes Maternal Aunt   . Breast cancer Maternal Aunt   . Diabetes Cousin   . Diabetes Cousin   . Hypertension Brother     Social History   Socioeconomic History  . Marital status: Divorced    Spouse name: Not on  file  . Number of children: Not on file  . Years of education: Not on file  . Highest education level: Not on file  Occupational History  . Occupation: Building control surveyor: UNEMPLOYED  Social Needs  . Financial resource strain: Not on file  . Food insecurity:    Worry: Not on file    Inability: Not on file  . Transportation needs:    Medical: Not on file    Non-medical: Not on file  Tobacco Use  . Smoking status: Former Smoker    Types: Cigarettes    Last attempt to quit: 01/23/1990    Years since quitting: 28.0  . Smokeless tobacco: Never Used  Substance and Sexual Activity  . Alcohol use: Yes    Comment: occ  . Drug use: No  . Sexual activity: Not Currently    Birth control/protection: Surgical  Lifestyle  . Physical activity:    Days per week: Not on file    Minutes per session: Not on file  . Stress: Not on file  Relationships  . Social connections:    Talks on phone: Not on file    Gets together: Not on file    Attends religious service: Not on file    Active member of club or organization: Not on file    Attends meetings of clubs or organizations: Not on file    Relationship status: Not on file  . Intimate partner violence:    Fear of current or ex partner: Not on file    Emotionally abused: Not on file    Physically abused: Not on file    Forced sexual activity: Not on file  Other Topics Concern  . Not on file  Social History Narrative   Regular exercise: walks a lot at work   Caffeine use: yes   The PMH, PSH, Social History, Family History, Medications, and allergies have been reviewed in [facility], and have been updated if relevant.   Review of Systems  Constitutional: Positive for fatigue.  HENT: Negative.   Eyes: Negative.   Respiratory: Negative.   Cardiovascular: Negative.   Gastrointestinal: Negative.   Endocrine: Negative.   Musculoskeletal: Negative.   Skin: Negative.   Allergic/Immunologic: Negative.   Neurological: Positive for  dizziness. Negative for tremors, seizures, syncope, facial asymmetry, speech difficulty, weakness, light-headedness, numbness and headaches.  Hematological: Negative.   Psychiatric/Behavioral: Negative.   All other systems reviewed and are negative.      Objective:    BP (!) 172/110 (BP Location: Left Arm, Cuff Size: Normal)   Pulse 68   Temp 98.3 F (36.8 C) (Oral)   Ht '5\' 1"'  (1.549 m)   Wt 144 lb 6.4  oz (65.5 kg)   SpO2 99%   BMI 27.28 kg/m   BP Readings from Last 3 Encounters:  01/23/18 (!) 172/110  09/14/17 (!) 155/108  07/03/17 134/90     Physical Exam Vitals signs and nursing note reviewed.  Constitutional:      General: She is not in acute distress.    Appearance: Normal appearance. She is not toxic-appearing.  HENT:     Head: Normocephalic and atraumatic.  Eyes:     Conjunctiva/sclera: Conjunctivae normal.  Neck:     Musculoskeletal: Normal range of motion.     Thyroid: Thyromegaly present.  Cardiovascular:     Rate and Rhythm: Normal rate and regular rhythm.     Pulses: Normal pulses.     Heart sounds: Normal heart sounds.  Pulmonary:     Effort: Pulmonary effort is normal.  Musculoskeletal: Normal range of motion.     Right lower leg: No edema.     Left lower leg: No edema.  Skin:    General: Skin is warm and dry.  Neurological:     General: No focal deficit present.     Mental Status: She is alert.     Cranial Nerves: No cranial nerve deficit.  Psychiatric:        Mood and Affect: Mood normal.        Thought Content: Thought content normal.        Judgment: Judgment normal.           Assessment & Plan:   Need for influenza vaccination - Plan: Flu Vaccine QUAD 6+ mos PF IM (Fluarix Quad PF)  Dizziness, nonspecific - Plan: EKG 12-Lead  Secondary hypertension - Plan: EKG 12-Lead  Essential hypertension No follow-ups on file.

## 2018-01-23 NOTE — Assessment & Plan Note (Addendum)
Deteriorated. >40 minutes spent in face to face time with patient, >50% spent in counselling or coordination of care.  Discussed the importance of taking her hydralazine as prescribed- 25 mg three times daily daily.  It is likely she has really not been taking this for weeks.  She is asymptomatic currently.  She agrees to take hydralazine as prescribed- will get take three doses today (separate by at least 4 hours).   Follow up in 2 weeks- sooner if she becomes symptomatic or has concerns. EKG bradycardia- otherwise reassuring.

## 2018-01-23 NOTE — Telephone Encounter (Signed)
Pt stated that she was seen in the ED and in triage they took her BP and it was 201/116. Pt was at the ED due to back spasms. Pt left after triage and was not seen. Pt has a h/o of HTN and stated that she take has missed a "few doses" of her hydralazine. Pt denies headache, chest pain, blurred vision, difficulty breathing or weakness.  Pt does not have a BP monitor with her and during triage call she was on her way to [facility] to check her BP.  Based on last night's BP appointment made with Dr Deborra Medina today at 12:40. Pt stated that she will recheck her BP and call back if it is normal and will cancel the appointment.  Reason for Disposition . [5] Systolic BP  >= 993 OR Diastolic >= 570  AND [1] having NO cardiac or neurologic symptoms  Answer Assessment - Initial Assessment Questions 1. BLOOD PRESSURE: "What is the blood pressure?" "Did you take at least two measurements 5 minutes apart?"     204/116 2. ONSET: "When did you take your blood pressure?"     In the ED last night at [facility] 3. HOW: "How did you obtain the blood pressure?" (e.g., visiting nurse, automatic home BP monitor)     Hospital monitor 4. HISTORY: "Do you have a history of high blood pressure?"     yes 5. MEDICATIONS: "Are you taking any medications for blood pressure?" "Have you missed any doses recently?"     hydralazine  Has missed a few doses 6. OTHER SYMPTOMS: "Do you have any symptoms?" (e.g., headache, chest pain, blurred vision, difficulty breathing, weakness)     none 7. PREGNANCY: "Is there any chance you are pregnant?" "When was your last menstrual period?"     n/a  Protocols used: HIGH BLOOD PRESSURE-A-AH

## 2018-01-23 NOTE — ED Triage Notes (Signed)
Patient complaining of lower back pain that started yesterday. Patient states she did not injure herself.

## 2018-01-23 NOTE — Patient Instructions (Addendum)
Great to see you. Please make sure you take your hydralazine as directed EVERY DAY- 25 mg- 1 tablet three times daily.  Restart synthroid 75 mcg daily.  Stop by up front to discuss endocrinology referral.  If you have not seen endocrinology, please make sure you follow up with me within 2 weeks, sooner if you have symptoms or concerns.

## 2018-01-23 NOTE — Assessment & Plan Note (Signed)
Restart synthroid 75 mcg daily ASAP- eRx refill sent.  Likely contributing to how she is currently feeling.  We discussed checking thyroid function today but since we will know it will be abnormal (has not been taking synthroid in over a month), deferred labs and refer urgently to endocrinology. The patient indicates understanding of these issues and agrees with the plan.

## 2018-01-25 DIAGNOSIS — T24032A Burn of unspecified degree of left lower leg, initial encounter: Secondary | ICD-10-CM | POA: Diagnosis not present

## 2018-01-25 DIAGNOSIS — T2030XA Burn of third degree of head, face, and neck, unspecified site, initial encounter: Secondary | ICD-10-CM | POA: Diagnosis not present

## 2018-01-25 DIAGNOSIS — T24301A Burn of third degree of unspecified site of right lower limb, except ankle and foot, initial encounter: Secondary | ICD-10-CM | POA: Diagnosis not present

## 2018-01-25 DIAGNOSIS — R000 Tachycardia, unspecified: Secondary | ICD-10-CM | POA: Diagnosis not present

## 2018-01-25 DIAGNOSIS — T24391D Burn of third degree of multiple sites of right lower limb, except ankle and foot, subsequent encounter: Secondary | ICD-10-CM | POA: Diagnosis not present

## 2018-01-25 DIAGNOSIS — J80 Acute respiratory distress syndrome: Secondary | ICD-10-CM | POA: Diagnosis not present

## 2018-01-25 DIAGNOSIS — T2000XA Burn of unspecified degree of head, face, and neck, unspecified site, initial encounter: Secondary | ICD-10-CM | POA: Diagnosis not present

## 2018-01-25 DIAGNOSIS — T3111 Burns involving 10-19% of body surface with 10-19% third degree burns: Secondary | ICD-10-CM | POA: Diagnosis not present

## 2018-01-25 DIAGNOSIS — I10 Essential (primary) hypertension: Secondary | ICD-10-CM | POA: Diagnosis not present

## 2018-01-25 DIAGNOSIS — R52 Pain, unspecified: Secondary | ICD-10-CM | POA: Diagnosis not present

## 2018-01-25 DIAGNOSIS — T22332A Burn of third degree of left upper arm, initial encounter: Secondary | ICD-10-CM | POA: Diagnosis not present

## 2018-01-25 DIAGNOSIS — T2007XA Burn of unspecified degree of neck, initial encounter: Secondary | ICD-10-CM | POA: Diagnosis not present

## 2018-01-25 DIAGNOSIS — T22232A Burn of second degree of left upper arm, initial encounter: Secondary | ICD-10-CM | POA: Diagnosis not present

## 2018-01-25 DIAGNOSIS — T300 Burn of unspecified body region, unspecified degree: Secondary | ICD-10-CM | POA: Diagnosis not present

## 2018-01-25 DIAGNOSIS — R9431 Abnormal electrocardiogram [ECG] [EKG]: Secondary | ICD-10-CM | POA: Diagnosis not present

## 2018-01-25 DIAGNOSIS — T2200XA Burn of unspecified degree of shoulder and upper limb, except wrist and hand, unspecified site, initial encounter: Secondary | ICD-10-CM | POA: Diagnosis not present

## 2018-01-25 DIAGNOSIS — T24031A Burn of unspecified degree of right lower leg, initial encounter: Secondary | ICD-10-CM | POA: Diagnosis not present

## 2018-01-25 DIAGNOSIS — T24302A Burn of third degree of unspecified site of left lower limb, except ankle and foot, initial encounter: Secondary | ICD-10-CM | POA: Diagnosis not present

## 2018-01-25 DIAGNOSIS — T24201A Burn of second degree of unspecified site of right lower limb, except ankle and foot, initial encounter: Secondary | ICD-10-CM | POA: Diagnosis not present

## 2018-01-25 DIAGNOSIS — T25332A Burn of third degree of left toe(s) (nail), initial encounter: Secondary | ICD-10-CM | POA: Diagnosis not present

## 2018-01-25 DIAGNOSIS — T2230XA Burn of third degree of shoulder and upper limb, except wrist and hand, unspecified site, initial encounter: Secondary | ICD-10-CM | POA: Diagnosis not present

## 2018-01-25 DIAGNOSIS — Z4682 Encounter for fitting and adjustment of non-vascular catheter: Secondary | ICD-10-CM | POA: Diagnosis not present

## 2018-01-25 DIAGNOSIS — T22032A Burn of unspecified degree of left upper arm, initial encounter: Secondary | ICD-10-CM | POA: Diagnosis not present

## 2018-01-25 DIAGNOSIS — T24392D Burn of third degree of multiple sites of left lower limb, except ankle and foot, subsequent encounter: Secondary | ICD-10-CM | POA: Diagnosis not present

## 2018-01-25 DIAGNOSIS — T24202A Burn of second degree of unspecified site of left lower limb, except ankle and foot, initial encounter: Secondary | ICD-10-CM | POA: Diagnosis not present

## 2018-01-25 DIAGNOSIS — T22392D Burn of third degree of multiple sites of left shoulder and upper limb, except wrist and hand, subsequent encounter: Secondary | ICD-10-CM | POA: Diagnosis not present

## 2018-01-30 HISTORY — PX: OTHER SURGICAL HISTORY: SHX169

## 2018-02-18 ENCOUNTER — Other Ambulatory Visit: Payer: Self-pay | Admitting: Family Medicine

## 2018-02-25 ENCOUNTER — Telehealth: Payer: Self-pay

## 2018-02-25 NOTE — Telephone Encounter (Signed)
Copied from [place] [id number]. Topic: General - Inquiry >> Feb 25, 2018  2:23 PM Vernona Rieger wrote: Reason for CRM: Patient would like to know if she can have Dr Deborra Medina fill out a handicap sticker or does she need to make an appt? She already has it all filled out just needs a signature

## 2018-02-26 NOTE — Telephone Encounter (Signed)
Spoke with pt , disability parking form filled out , she will be advised when it is sign & ready to be picked up

## 2018-03-03 NOTE — Telephone Encounter (Signed)
Pt aware form ready at front/thx dmf

## 2018-03-04 ENCOUNTER — Telehealth: Payer: Self-pay | Admitting: Family Medicine

## 2018-03-04 ENCOUNTER — Other Ambulatory Visit: Payer: Self-pay

## 2018-03-04 DIAGNOSIS — F339 Major depressive disorder, recurrent, unspecified: Secondary | ICD-10-CM

## 2018-03-04 MED ORDER — TRAZODONE HCL 50 MG PO TABS: 25.0000 mg | ORAL_TABLET | Freq: Every evening | ORAL | 3 refills | Status: DC | PRN

## 2018-03-04 NOTE — Telephone Encounter (Signed)
Rx sent , pt is aware that medication is sent in.

## 2018-03-04 NOTE — Telephone Encounter (Signed)
Yes this is not a controlled substance.  Okay to refill as requested.

## 2018-03-04 NOTE — Telephone Encounter (Signed)
Dr. Deborra Medina please advise. Would you like me to send in another refill of Trazodone 50 mg for pt ?

## 2018-03-04 NOTE — Telephone Encounter (Signed)
Patient came by wanting to know if she could get a refill on trazodone 50 mg. Paitnet states that she had a few pills left when she came back from the hospital but she thinks someone came in her apartment complex and stole them. Pharmacy [facility] on [address] is Technical sales engineer. patient phone number [phone].

## 2018-04-14 ENCOUNTER — Other Ambulatory Visit: Payer: Self-pay

## 2018-04-14 MED ORDER — LEVOTHYROXINE SODIUM 75 MCG PO TABS: 75.0000 ug | ORAL_TABLET | Freq: Every day | ORAL | 1 refills | Status: DC

## 2018-05-14 ENCOUNTER — Other Ambulatory Visit: Payer: Self-pay | Admitting: Family Medicine

## 2018-05-14 DIAGNOSIS — F339 Major depressive disorder, recurrent, unspecified: Secondary | ICD-10-CM

## 2018-05-14 DIAGNOSIS — E039 Hypothyroidism, unspecified: Secondary | ICD-10-CM

## 2018-05-14 MED ORDER — HYDRALAZINE HCL 25 MG PO TABS: ORAL_TABLET | ORAL | 0 refills | Status: DC

## 2018-05-14 MED ORDER — LEVOTHYROXINE SODIUM 75 MCG PO TABS: 75.0000 ug | ORAL_TABLET | Freq: Every day | ORAL | 1 refills | Status: DC

## 2018-05-14 MED ORDER — CLONAZEPAM 0.5 MG PO TABS: 0.5000 mg | ORAL_TABLET | Freq: Every day | ORAL | 2 refills | Status: DC | PRN

## 2018-05-14 NOTE — Telephone Encounter (Signed)
Please advise on refill request

## 2018-06-27 ENCOUNTER — Encounter (HOSPITAL_COMMUNITY): Payer: Self-pay

## 2018-06-27 ENCOUNTER — Emergency Department (HOSPITAL_COMMUNITY): Payer: 59

## 2018-06-27 ENCOUNTER — Emergency Department (HOSPITAL_COMMUNITY)
Admission: EM | Admit: 2018-06-27 | Discharge: 2018-06-27 | Disposition: A | Discharge status: Home / Self Care | Payer: 59 | Attending: Emergency Medicine | Admitting: Emergency Medicine

## 2018-06-27 ENCOUNTER — Other Ambulatory Visit: Payer: Self-pay

## 2018-06-27 DIAGNOSIS — R0602 Shortness of breath: Secondary | ICD-10-CM | POA: Insufficient documentation

## 2018-06-27 DIAGNOSIS — Z87891 Personal history of nicotine dependence: Secondary | ICD-10-CM | POA: Insufficient documentation

## 2018-06-27 DIAGNOSIS — Z79899 Other long term (current) drug therapy: Secondary | ICD-10-CM | POA: Insufficient documentation

## 2018-06-27 DIAGNOSIS — R0789 Other chest pain: Secondary | ICD-10-CM | POA: Diagnosis not present

## 2018-06-27 DIAGNOSIS — I10 Essential (primary) hypertension: Secondary | ICD-10-CM | POA: Diagnosis not present

## 2018-06-27 DIAGNOSIS — E039 Hypothyroidism, unspecified: Secondary | ICD-10-CM | POA: Diagnosis not present

## 2018-06-27 LAB — I-STAT BETA HCG BLOOD, ED (MC, WL, AP ONLY): I-stat hCG, quantitative: <5 m[IU]/mL (ref <5)

## 2018-06-27 LAB — HEPATIC FUNCTION PANEL
ALT: 14 U/L (ref 0–44)
AST: 22 U/L (ref 15–41)
Albumin: 3.7 g/dL (ref 3.5–5.0)
Alkaline Phosphatase: 47 U/L (ref 38–126)
Bilirubin, Direct: 0.2 mg/dL (ref 0.0–0.2)
Indirect Bilirubin: 0.7 mg/dL (ref 0.3–0.9)
Total Bilirubin: 0.9 mg/dL (ref 0.3–1.2)
Total Protein: 6.7 g/dL (ref 6.5–8.1)

## 2018-06-27 LAB — CBC
HCT: 41.6 % (ref 36.0–46.0)
Hemoglobin: 12.1 g/dL (ref 12.0–15.0)
MCH: 18.9 pg — ABNORMAL LOW (ref 26.0–34.0)
MCHC: 29.1 g/dL — ABNORMAL LOW (ref 30.0–36.0)
MCV: 65.1 fL — ABNORMAL LOW (ref 80.0–100.0)
Platelets: 314 10*3/uL (ref 150–400)
RBC: 6.39 MIL/uL — ABNORMAL HIGH (ref 3.87–5.11)
RDW: 21.2 % — ABNORMAL HIGH (ref 11.5–15.5)
WBC: 6.7 10*3/uL (ref 4.0–10.5)
nRBC: 0 % (ref 0.0–0.2)

## 2018-06-27 LAB — BASIC METABOLIC PANEL
Anion gap: 13 (ref 5–15)
BUN: 19 mg/dL (ref 6–20)
CO2: 21 mmol/L — ABNORMAL LOW (ref 22–32)
Calcium: 9.1 mg/dL (ref 8.9–10.3)
Chloride: 107 mmol/L (ref 98–111)
Creatinine, Ser: 0.94 mg/dL (ref 0.44–1.00)
GFR calc Af Amer: >60 mL/min (ref >60)
GFR calc non Af Amer: >60 mL/min (ref >60)
Glucose, Bld: 111 mg/dL — ABNORMAL HIGH (ref 70–99)
Potassium: 4 mmol/L (ref 3.5–5.1)
Sodium: 141 mmol/L (ref 135–145)

## 2018-06-27 LAB — D-DIMER, QUANTITATIVE: D-Dimer, Quant: 0.31 ug/mL-FEU (ref 0.00–0.50)

## 2018-06-27 LAB — TROPONIN I: Troponin I: <0.03 ng/mL (ref <0.03)

## 2018-06-27 LAB — LIPASE, BLOOD: Lipase: 33 U/L (ref 11–51)

## 2018-06-27 MED ORDER — SODIUM CHLORIDE 0.9% FLUSH
3.0000 mL | Freq: Once | INTRAVENOUS | Status: AC
  Administered 2018-06-27: 3 mL via INTRAVENOUS

## 2018-06-27 MED ORDER — LIDOCAINE VISCOUS HCL 2 % MT SOLN
15.0000 mL | Freq: Once | OROMUCOSAL | Status: AC
  Administered 2018-06-27: 15 mL via ORAL
  Filled 2018-06-27: qty 15

## 2018-06-27 MED ORDER — KETOROLAC TROMETHAMINE 30 MG/ML IJ SOLN
15.0000 mg | Freq: Once | INTRAMUSCULAR | Status: AC
  Administered 2018-06-27: 18:00:00 15 mg via INTRAVENOUS
  Filled 2018-06-27: qty 1

## 2018-06-27 MED ORDER — ALUM & MAG HYDROXIDE-SIMETH 200-200-20 MG/5ML PO SUSP
30.0000 mL | Freq: Once | ORAL | Status: AC
  Administered 2018-06-27: 30 mL via ORAL
  Filled 2018-06-27: qty 30

## 2018-06-27 NOTE — ED Triage Notes (Signed)
Pt presents with CP that's left sided and been intermittent since Wednesday morning.  Pt reports starting a new medication for BP (amlodipine) on Wednesday, however only took it wed and Thursday and pain continues.  Pt's BP is WNL upon arrival to ED and has just taken second dose due today.  During triage pt has sharp chest pains that make her grimace.  Pt is A&Ox4.
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
## 2018-06-27 NOTE — ED Notes (Signed)
Repeat lab sent.

## 2018-06-27 NOTE — ED Notes (Signed)
Patient transported to X-ray 

## 2018-06-27 NOTE — Discharge Instructions (Addendum)
Your work-up today has been very reassuring, labs, chest x-ray and EKG look good.  I suspect that some of your chest pain may be related to reflux especially since it was improved today with treatment for reflux.  Please begin taking Pepcid twice daily to help with this, you can use Tums or Maalox as needed for breakthrough symptoms.  If your symptoms persist please follow-up with your primary care doctor.  Return to the emergency department for worsening chest pain, shortness of breath, fevers, if you feel lightheaded or like you may pass out or any other new or concerning symptoms occur.
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
## 2018-07-27 DIAGNOSIS — Z0000 Encounter for general adult medical examination without abnormal findings: Secondary | ICD-10-CM | POA: Insufficient documentation

## 2018-07-27 DIAGNOSIS — R079 Chest pain, unspecified: Secondary | ICD-10-CM | POA: Insufficient documentation

## 2018-07-27 DIAGNOSIS — Z09 Encounter for follow-up examination after completed treatment for conditions other than malignant neoplasm: Secondary | ICD-10-CM | POA: Insufficient documentation

## 2018-07-27 DIAGNOSIS — T3111 Burns involving 10-19% of body surface with 10-19% third degree burns: Secondary | ICD-10-CM | POA: Insufficient documentation

## 2018-07-28 ENCOUNTER — Ambulatory Visit (INDEPENDENT_AMBULATORY_CARE_PROVIDER_SITE_OTHER): Payer: 59 | Admitting: Family Medicine

## 2018-07-28 ENCOUNTER — Encounter: Payer: Self-pay | Admitting: Family Medicine

## 2018-07-28 VITALS — Wt 153.0 lb

## 2018-07-28 DIAGNOSIS — T3111 Burns involving 10-19% of body surface with 10-19% third degree burns: Secondary | ICD-10-CM | POA: Diagnosis not present

## 2018-07-28 DIAGNOSIS — I10 Essential (primary) hypertension: Secondary | ICD-10-CM

## 2018-07-28 DIAGNOSIS — Z09 Encounter for follow-up examination after completed treatment for conditions other than malignant neoplasm: Secondary | ICD-10-CM

## 2018-07-28 DIAGNOSIS — R079 Chest pain, unspecified: Secondary | ICD-10-CM

## 2018-07-28 MED ORDER — TRAMADOL HCL 50 MG PO TABS: 50.0000 mg | ORAL_TABLET | Freq: Three times a day (TID) | ORAL | 0 refills | Status: AC | PRN

## 2018-07-28 MED ORDER — AMLODIPINE BESYLATE 5 MG PO TABS: 5.0000 mg | ORAL_TABLET | Freq: Two times a day (BID) | ORAL | 0 refills | Status: DC

## 2018-07-28 MED ORDER — HYDROCODONE-ACETAMINOPHEN 10-325 MG PO TABS: 1.0000 | ORAL_TABLET | Freq: Three times a day (TID) | ORAL | 0 refills | Status: AC | PRN

## 2018-07-28 NOTE — Assessment & Plan Note (Addendum)
>  40 minutes spent in face to face time with patient, >50% spent in counselling or coordination of care discussing her ED visits, burn, pain management, HTN. I agree to take over her pain management- PDMP reviewed without red flags.  Understandably pain is now worse since she is working.  eRx sent for tramadol for moderate pain and hydrocodone for severe pain.  Sedation precautions given. She will follow up with me in 2 weeks for UDS/CSC and BP recheck. The patient indicates understanding of these issues and agrees with the plan.

## 2018-07-28 NOTE — Assessment & Plan Note (Signed)
Resolved

## 2018-07-28 NOTE — Assessment & Plan Note (Signed)
Not well controlled- she has not been taking amlodipine twice daily.  eRx sent for amlodipine 5 mg twice daily and she will follow up with me in 2 weeks. The patient indicates understanding of these issues and agrees with the plan.

## 2018-08-08 ENCOUNTER — Telehealth: Payer: Self-pay

## 2018-08-08 DIAGNOSIS — G8921 Chronic pain due to trauma: Secondary | ICD-10-CM | POA: Insufficient documentation
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
## 2018-08-11 ENCOUNTER — Ambulatory Visit: Payer: 59 | Admitting: Family Medicine

## 2018-08-11 ENCOUNTER — Encounter: Payer: Self-pay | Admitting: Family Medicine

## 2018-08-11 VITALS — BP 140/88 | HR 77 | Temp 98.3°F | Ht 61.0 in | Wt 159.6 lb

## 2018-08-11 DIAGNOSIS — G8929 Other chronic pain: Secondary | ICD-10-CM

## 2018-08-11 DIAGNOSIS — G8921 Chronic pain due to trauma: Secondary | ICD-10-CM

## 2018-08-11 DIAGNOSIS — I10 Essential (primary) hypertension: Secondary | ICD-10-CM | POA: Diagnosis not present

## 2018-08-11 DIAGNOSIS — D509 Iron deficiency anemia, unspecified: Secondary | ICD-10-CM

## 2018-08-11 DIAGNOSIS — T3111 Burns involving 10-19% of body surface with 10-19% third degree burns: Secondary | ICD-10-CM

## 2018-08-11 LAB — COMPREHENSIVE METABOLIC PANEL
ALT: 15 U/L (ref 0–35)
AST: 20 U/L (ref 0–37)
Albumin: 4.6 g/dL (ref 3.5–5.2)
Alkaline Phosphatase: 55 U/L (ref 39–117)
BUN: 19 mg/dL (ref 6–23)
CO2: 26 mEq/L (ref 19–32)
Calcium: 9.3 mg/dL (ref 8.4–10.5)
Chloride: 102 mEq/L (ref 96–112)
Creatinine, Ser: 0.78 mg/dL (ref 0.40–1.20)
GFR: 92.8 mL/min (ref >60.00)
Glucose, Bld: 88 mg/dL (ref 70–99)
Potassium: 4.2 mEq/L (ref 3.5–5.1)
Sodium: 138 mEq/L (ref 135–145)
Total Bilirubin: 0.5 mg/dL (ref 0.2–1.2)
Total Protein: 7.6 g/dL (ref 6.0–8.3)

## 2018-08-11 LAB — TSH: TSH: 11.94 u[IU]/mL — ABNORMAL HIGH (ref 0.35–4.50)

## 2018-08-11 LAB — CBC WITH DIFFERENTIAL/PLATELET
Basophils Absolute: 0 10*3/uL (ref 0.0–0.1)
Basophils Relative: 0.6 % (ref 0.0–3.0)
Eosinophils Absolute: 0.2 10*3/uL (ref 0.0–0.7)
Eosinophils Relative: 2 % (ref 0.0–5.0)
HCT: 39.5 % (ref 36.0–46.0)
Hemoglobin: 12.3 g/dL (ref 12.0–15.0)
Lymphocytes Relative: 52.6 % — ABNORMAL HIGH (ref 12.0–46.0)
Lymphs Abs: 4.3 10*3/uL — ABNORMAL HIGH (ref 0.7–4.0)
MCHC: 31 g/dL (ref 30.0–36.0)
MCV: 64.7 fl — ABNORMAL LOW (ref 78.0–100.0)
Monocytes Absolute: 0.7 10*3/uL (ref 0.1–1.0)
Monocytes Relative: 8.9 % (ref 3.0–12.0)
Neutro Abs: 2.9 10*3/uL (ref 1.4–7.7)
Neutrophils Relative %: 35.9 % — ABNORMAL LOW (ref 43.0–77.0)
Platelets: 317 10*3/uL (ref 150.0–400.0)
RBC: 6.11 Mil/uL — ABNORMAL HIGH (ref 3.87–5.11)
RDW: 19.9 % — ABNORMAL HIGH (ref 11.5–15.5)
WBC: 8.1 10*3/uL (ref 4.0–10.5)

## 2018-08-11 LAB — LIPID PANEL
Cholesterol: 133 mg/dL (ref 0–200)
HDL: 48.1 mg/dL (ref >39.00)
LDL Cholesterol: 72 mg/dL (ref 0–99)
NonHDL: 85.38
Total CHOL/HDL Ratio: 3
Triglycerides: 66 mg/dL (ref 0.0–149.0)
VLDL: 13.2 mg/dL (ref 0.0–40.0)

## 2018-08-11 LAB — FERRITIN: Ferritin: 28.7 ng/mL (ref 10.0–291.0)

## 2018-08-11 MED ORDER — HYDROCODONE-ACETAMINOPHEN 10-325 MG PO TABS: 1.0000 | ORAL_TABLET | Freq: Three times a day (TID) | ORAL | 0 refills | Status: AC | PRN

## 2018-08-11 MED ORDER — TRAMADOL HCL 50 MG PO TABS: 50.0000 mg | ORAL_TABLET | Freq: Three times a day (TID) | ORAL | 0 refills | Status: DC | PRN

## 2018-08-11 MED ORDER — AMLODIPINE BESYLATE 5 MG PO TABS: 5.0000 mg | ORAL_TABLET | Freq: Two times a day (BID) | ORAL | 2 refills | Status: DC

## 2018-08-11 MED ORDER — HYDROCODONE-ACETAMINOPHEN 10-325 MG PO TABS: 1.0000 | ORAL_TABLET | Freq: Three times a day (TID) | ORAL | 0 refills | Status: DC | PRN

## 2018-08-11 MED ORDER — TRAMADOL HCL 50 MG PO TABS: 50.0000 mg | ORAL_TABLET | Freq: Three times a day (TID) | ORAL | 0 refills | Status: AC | PRN

## 2018-08-11 NOTE — Assessment & Plan Note (Signed)
BP under much better control with Amlodipine 5 mg twice daily. No chnages made to rxs today. The patient indicates understanding of these issues and agrees with the plan. Orders Placed This Encounter  Procedures  . Pain Mgmt, Profile 8 w/Conf, U  . Comprehensive metabolic panel  . TSH  . Lipid panel  . CBC with Differential/Platelet  . Ferritin  . Ambulatory referral to Physical Therapy
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
## 2018-08-11 NOTE — Patient Instructions (Addendum)
Great to see you. I will call you with your lab results from today and you can view them online.   Your blood pressure looks much better.  Try debrox OTC and keep me updated.

## 2018-08-14 LAB — PAIN MGMT, PROFILE 8 W/CONF, U
6 Acetylmorphine: NEGATIVE ng/mL
Alcohol Metabolites: POSITIVE ng/mL — AB (ref <500)
Amphetamines: NEGATIVE ng/mL
Benzodiazepines: NEGATIVE ng/mL
Buprenorphine, Urine: NEGATIVE ng/mL
Cocaine Metabolite: NEGATIVE ng/mL
Codeine: NEGATIVE ng/mL
Creatinine: 27.6 mg/dL
Ethyl Glucuronide (ETG): 1985 ng/mL
Ethyl Sulfate (ETS): 1061 ng/mL
Hydrocodone: NEGATIVE ng/mL
Hydromorphone: NEGATIVE ng/mL
MDMA: NEGATIVE ng/mL
Marijuana Metabolite: NEGATIVE ng/mL
Morphine: NEGATIVE ng/mL
Norhydrocodone: 94 ng/mL
Opiates: POSITIVE ng/mL
Oxidant: NEGATIVE ug/mL
Oxycodone: NEGATIVE ng/mL
pH: 7.3 (ref 4.5–9.0)

## 2018-08-22 ENCOUNTER — Other Ambulatory Visit: Payer: Self-pay | Admitting: Family Medicine

## 2018-08-22 DIAGNOSIS — E039 Hypothyroidism, unspecified: Secondary | ICD-10-CM

## 2018-08-28 ENCOUNTER — Other Ambulatory Visit: Payer: Self-pay | Admitting: Family Medicine

## 2018-08-28 ENCOUNTER — Other Ambulatory Visit: Payer: Self-pay

## 2018-08-28 DIAGNOSIS — E039 Hypothyroidism, unspecified: Secondary | ICD-10-CM

## 2018-08-28 MED ORDER — LEVOTHYROXINE SODIUM 75 MCG PO TABS: 75.0000 ug | ORAL_TABLET | Freq: Every day | ORAL | 0 refills | Status: DC

## 2018-09-02 ENCOUNTER — Other Ambulatory Visit: Payer: Self-pay

## 2018-09-02 ENCOUNTER — Telehealth: Payer: Self-pay | Admitting: Family Medicine

## 2018-09-02 DIAGNOSIS — E041 Nontoxic single thyroid nodule: Secondary | ICD-10-CM

## 2018-09-02 DIAGNOSIS — I609 Nontraumatic subarachnoid hemorrhage, unspecified: Secondary | ICD-10-CM

## 2018-09-02 DIAGNOSIS — E039 Hypothyroidism, unspecified: Secondary | ICD-10-CM
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
## 2018-09-03 NOTE — Telephone Encounter (Signed)
Yes absolutely okay to place referral.  Thank you!

## 2018-09-03 NOTE — Telephone Encounter (Signed)
Referral placed- pt is aware

## 2018-09-03 NOTE — Telephone Encounter (Signed)
Please advise if okay to place referral.

## 2018-09-05 ENCOUNTER — Encounter: Payer: Self-pay | Admitting: Family Medicine

## 2018-09-05 NOTE — Telephone Encounter (Signed)
Pt called back to advise they have not gotten this referral. It does not look like it was sent to [facility] neurology. However, when pt called, she was advised  Dr Jimmey Ralph passed on 15-Sep-2018. So she is ok with seeing any dr there.   Please send new referral.

## 2018-09-06 ENCOUNTER — Encounter: Payer: Self-pay | Admitting: Family Medicine

## 2018-09-07 ENCOUNTER — Encounter: Payer: Self-pay | Admitting: Family Medicine

## 2018-09-07 ENCOUNTER — Other Ambulatory Visit: Payer: Self-pay | Admitting: Family Medicine

## 2018-09-07 DIAGNOSIS — F339 Major depressive disorder, recurrent, unspecified: Secondary | ICD-10-CM

## 2018-09-08 NOTE — Telephone Encounter (Signed)
TA-LOV 6.2020/UDS & CSC UTD/per [place] PMP pt is compliant without red flags/thx dmf

## 2018-09-09 NOTE — Telephone Encounter (Signed)
Faxed referral to [facility] [phone]

## 2018-09-11 ENCOUNTER — Telehealth: Payer: Self-pay

## 2018-09-11 NOTE — Telephone Encounter (Signed)
Copied from [place] [phone]. Topic: Referral - Question >> Sep 11, 2018 12:50 PM Valla Leaver wrote: Reason for CRM: Abigail Butts with [facility] calling to clarify if patient is supposed to come to them for the reason she was refered because the patient was seen at [place] for the same reason in their neurosurgery department, and the patient contacted [facility] herself yesterday attempting to be seen by them. Abigail Butts says they don't have a Dr. Jimmey Ralph there either. Please advise.

## 2018-09-16 NOTE — Telephone Encounter (Signed)
TB-I am not sure what is going on here with this referral situation? Can you please help? thx dmf

## 2018-09-22 ENCOUNTER — Other Ambulatory Visit: Payer: Self-pay | Admitting: Family Medicine

## 2018-09-22 DIAGNOSIS — E039 Hypothyroidism, unspecified: Secondary | ICD-10-CM

## 2018-10-01 ENCOUNTER — Other Ambulatory Visit: Payer: Self-pay

## 2018-10-02 ENCOUNTER — Other Ambulatory Visit (INDEPENDENT_AMBULATORY_CARE_PROVIDER_SITE_OTHER): Payer: 59

## 2018-10-02 DIAGNOSIS — E039 Hypothyroidism, unspecified: Secondary | ICD-10-CM

## 2018-10-02 DIAGNOSIS — E041 Nontoxic single thyroid nodule: Secondary | ICD-10-CM | POA: Diagnosis not present

## 2018-10-02 LAB — T4, FREE: Free T4: 1.2 ng/dL (ref 0.60–1.60)

## 2018-10-02 LAB — TSH: TSH: 2.33 u[IU]/mL (ref 0.35–4.50)

## 2018-10-03 LAB — T3: T3, Total: 95 ng/dL (ref 76–181)

## 2018-10-24 ENCOUNTER — Other Ambulatory Visit: Payer: Self-pay | Admitting: Family Medicine

## 2018-10-24 DIAGNOSIS — E039 Hypothyroidism, unspecified: Secondary | ICD-10-CM

## 2018-12-09 ENCOUNTER — Other Ambulatory Visit: Payer: Self-pay | Admitting: Family Medicine

## 2018-12-09 NOTE — Telephone Encounter (Signed)
Last fill for both meds 08/11/18  #60/0 Expired on 10/10/18 and 08/16/18 Last OV 08/11/18 Preferred pharmacy [facility], [address]

## 2018-12-09 NOTE — Telephone Encounter (Signed)
Patient requesting HYDROcodone-acetaminophen (NORCO) 10-325 MG tablet traMADol (ULTRAM) 50 MG tablet no on current medication list.

## 2018-12-09 NOTE — Telephone Encounter (Signed)
Can you write this as fformal refill request, PDMP reviewed, UDS reviewed, etc and show B[place]ell how to do that?  Thanks so much!

## 2018-12-09 NOTE — Telephone Encounter (Signed)
Medication Refill - Medication:  HYDROcodone-acetaminophen (NORCO) 10-325 MG tablet traMADol (ULTRAM) 50 MG tablet  Has the patient contacted their pharmacy? Yes advised to call office.   Preferred Pharmacy (with phone number or street name):  [facility] [id number] - [place], [address] [phone] (Phone) [phone] (Fax)   Agent: Please be advised that RX refills may take up to 3 business days. We ask that you follow-up with your pharmacy.

## 2018-12-10 MED ORDER — TRAMADOL HCL 50 MG PO TABS: ORAL_TABLET | ORAL | 0 refills | Status: DC

## 2018-12-10 MED ORDER — HYDROCODONE-ACETAMINOPHEN 10-325 MG PO TABS: ORAL_TABLET | ORAL | 0 refills | Status: DC

## 2018-12-10 NOTE — Telephone Encounter (Signed)
TA-Pt is requesting Hydrocodone 10-325mg  last filled 6.29.20 and Tramadol 50mg  last filled 10.15.20/LOV: 6.29.20/UDS is UTD and not due till 7.1.2021/Rx's pending per your approval/thx dmf

## 2018-12-20 ENCOUNTER — Other Ambulatory Visit: Payer: Self-pay | Admitting: Family Medicine

## 2018-12-20 DIAGNOSIS — F339 Major depressive disorder, recurrent, unspecified: Secondary | ICD-10-CM

## 2018-12-22 NOTE — Telephone Encounter (Signed)
Last fill 03/04/18  #30/3 Last OV 08/11/18

## 2019-01-28 ENCOUNTER — Other Ambulatory Visit: Payer: Self-pay | Admitting: Family Medicine

## 2019-02-04 NOTE — Telephone Encounter (Signed)
Last OV 08/11/18 Last fill 08/11/18  #60/2

## 2019-02-04 NOTE — Telephone Encounter (Signed)
Pharmacy called in stating they had to give an emergency supply of this medication to pt. Pt is completely out and is needing a refill. Please advise.

## 2019-02-05 ENCOUNTER — Other Ambulatory Visit: Payer: Self-pay | Admitting: Family Medicine

## 2019-02-17 ENCOUNTER — Encounter: Payer: Self-pay | Admitting: Family Medicine

## 2019-02-19 ENCOUNTER — Encounter: Payer: Self-pay | Admitting: Family Medicine

## 2019-02-19 MED ORDER — FLUCONAZOLE 150 MG PO TABS: 150.0000 mg | ORAL_TABLET | Freq: Once | ORAL | 0 refills | Status: AC

## 2019-02-19 NOTE — Telephone Encounter (Signed)
I spoke with patient and verified pharmacy.  Pt aware that Rx for Diflucan 150mg  has been sent in today.  Pt would like to cancel appointment for Monday.  Appointment canceled.

## 2019-02-23 ENCOUNTER — Ambulatory Visit: Payer: 59 | Admitting: Family Medicine

## 2019-03-02 ENCOUNTER — Telehealth: Payer: Self-pay | Admitting: Family Medicine

## 2019-03-02 NOTE — Telephone Encounter (Signed)
TA-Pt requested refill of Hydrocodone/per [place] PMP last filled 10.28.20/Per [place] PMP pt is compliant/ with that/but on 1.14.2021 she had Oxy 5mg  #10 from [place] advise/thx dmf

## 2019-03-02 NOTE — Telephone Encounter (Signed)
We cannot refill this for her, unfortunately.

## 2019-03-02 NOTE — Telephone Encounter (Signed)
Patient is calling and requesting a refill for Hydrocodone sent to [facility] on [address] in [place]. CB [phone]

## 2019-03-03 NOTE — Telephone Encounter (Signed)
Sent pt a MyChart message/thx dmf

## 2019-03-06 ENCOUNTER — Ambulatory Visit: Payer: 59 | Admitting: Family Medicine

## 2019-03-06 ENCOUNTER — Encounter: Payer: Self-pay | Admitting: Family Medicine

## 2019-03-06 ENCOUNTER — Other Ambulatory Visit: Payer: Self-pay

## 2019-03-06 VITALS — BP 130/90 | HR 69 | Temp 97.5°F | Ht 61.0 in | Wt 152.0 lb

## 2019-03-06 DIAGNOSIS — M549 Dorsalgia, unspecified: Secondary | ICD-10-CM

## 2019-03-06 DIAGNOSIS — B360 Pityriasis versicolor: Secondary | ICD-10-CM | POA: Diagnosis not present

## 2019-03-06 DIAGNOSIS — M545 Low back pain, unspecified: Secondary | ICD-10-CM

## 2019-03-06 MED ORDER — FLUCONAZOLE 150 MG PO TABS: 150.0000 mg | ORAL_TABLET | Freq: Once | ORAL | 0 refills | Status: AC

## 2019-03-06 MED ORDER — NAPROXEN 500 MG PO TABS: 500.0000 mg | ORAL_TABLET | Freq: Two times a day (BID) | ORAL | 1 refills | Status: DC

## 2019-03-06 MED ORDER — CYCLOBENZAPRINE HCL 5 MG PO TABS: 5.0000 mg | ORAL_TABLET | Freq: Every evening | ORAL | 0 refills | Status: DC | PRN

## 2019-03-06 MED ORDER — KETOCONAZOLE 2 % EX CREA: 1.0000 "application " | TOPICAL_CREAM | Freq: Two times a day (BID) | CUTANEOUS | 1 refills | Status: DC

## 2019-03-06 NOTE — Patient Instructions (Addendum)
Heating pad 2x/day for 15-10min on then off Stretching/range of motion exercises daily after heating pad - see below Take naproxen 500mg  1 tab 2x/day with food Take flexeril 5mg  1 tab at bedtime - may make you sleepy   Back Exercises The following exercises strengthen the muscles that help to support the trunk and back. They also help to keep the lower back flexible. Doing these exercises can help to prevent back pain or lessen existing pain.  If you have back pain or discomfort, try doing these exercises 2-3 times each day or as told by your health care provider.  As your pain improves, do them once each day, but increase the number of times that you repeat the steps for each exercise (do more repetitions).  To prevent the recurrence of back pain, continue to do these exercises once each day or as told by your health care provider. Do exercises exactly as told by your health care provider and adjust them as directed. It is normal to feel mild stretching, pulling, tightness, or discomfort as you do these exercises, but you should stop right away if you feel sudden pain or your pain gets worse. Exercises Single knee to chest Repeat these steps 3-5 times for each leg: 1. Lie on your back on a firm bed or the floor with your legs extended. 2. Bring one knee to your chest. Your other leg should stay extended and in contact with the floor. 3. Hold your knee in place by grabbing your knee or thigh with both hands and hold. 4. Pull on your knee until you feel a gentle stretch in your lower back or buttocks. 5. Hold the stretch for 10-30 seconds. 6. Slowly release and straighten your leg. Pelvic tilt Repeat these steps 5-10 times: 1. Lie on your back on a firm bed or the floor with your legs extended. 2. Bend your knees so they are pointing toward the ceiling and your feet are flat on the floor. 3. Tighten your lower abdominal muscles to press your lower back against the floor. This motion will  tilt your pelvis so your tailbone points up toward the ceiling instead of pointing to your feet or the floor. 4. With gentle tension and even breathing, hold this position for 5-10 seconds. Cat-cow Repeat these steps until your lower back becomes more flexible: 1. Get into a hands-and-knees position on a firm surface. Keep your hands under your shoulders, and keep your knees under your hips. You may place padding under your knees for comfort. 2. Let your head hang down toward your chest. Contract your abdominal muscles and point your tailbone toward the floor so your lower back becomes rounded like the back of a cat. 3. Hold this position for 5 seconds. 4. Slowly lift your head, let your abdominal muscles relax and point your tailbone up toward the ceiling so your back forms a sagging arch like the back of a cow. 5. Hold this position for 5 seconds.  Press-ups Repeat these steps 5-10 times: 1. Lie on your abdomen (face-down) on the floor. 2. Place your palms near your head, about shoulder-width apart. 3. Keeping your back as relaxed as possible and keeping your hips on the floor, slowly straighten your arms to raise the top half of your body and lift your shoulders. Do not use your back muscles to raise your upper torso. You may adjust the placement of your hands to make yourself more comfortable. 4. Hold this position for 5 seconds while you  keep your back relaxed. 5. Slowly return to lying flat on the floor.  Bridges Repeat these steps 10 times: 1. Lie on your back on a firm surface. 2. Bend your knees so they are pointing toward the ceiling and your feet are flat on the floor. Your arms should be flat at your sides, next to your body. 3. Tighten your buttocks muscles and lift your buttocks off the floor until your waist is at almost the same height as your knees. You should feel the muscles working in your buttocks and the back of your thighs. If you do not feel these muscles, slide your  feet 1-2 inches farther away from your buttocks. 4. Hold this position for 3-5 seconds. 5. Slowly lower your hips to the starting position, and allow your buttocks muscles to relax completely. If this exercise is too easy, try doing it with your arms crossed over your chest. Abdominal crunches Repeat these steps 5-10 times: 1. Lie on your back on a firm bed or the floor with your legs extended. 2. Bend your knees so they are pointing toward the ceiling and your feet are flat on the floor. 3. Cross your arms over your chest. 4. Tip your chin slightly toward your chest without bending your neck. 5. Tighten your abdominal muscles and slowly raise your trunk (torso) high enough to lift your shoulder blades a tiny bit off the floor. Avoid raising your torso higher than that because it can put too much stress on your low back and does not help to strengthen your abdominal muscles. 6. Slowly return to your starting position. Back lifts Repeat these steps 5-10 times: 1. Lie on your abdomen (face-down) with your arms at your sides, and rest your forehead on the floor. 2. Tighten the muscles in your legs and your buttocks. 3. Slowly lift your chest off the floor while you keep your hips pressed to the floor. Keep the back of your head in line with the curve in your back. Your eyes should be looking at the floor. 4. Hold this position for 3-5 seconds. 5. Slowly return to your starting position. Contact a health care provider if:  Your back pain or discomfort gets much worse when you do an exercise.  Your worsening back pain or discomfort does not lessen within 2 hours after you exercise. If you have any of these problems, stop doing these exercises right away. Do not do them again unless your health care provider says that you can. Get help right away if:  You develop sudden, severe back pain. If this happens, stop doing the exercises right away. Do not do them again unless your health care provider  says that you can. This information is not intended to replace advice given to you by your health care provider. Make sure you discuss any questions you have with your health care provider. Document Revised: 06/05/2018 Document Reviewed: 10/31/2017 Elsevier Patient Education  [place].

## 2019-03-06 NOTE — Progress Notes (Signed)
Kaitlyn Solomon is a 54 y.o. female  Chief Complaint  Patient presents with  . Back Pain    Pt c/o lower lt back pain.  Pt said she has frequent uti, which Dr. Deborra Medina has been treating.  Pt back pain has been there for 3-4weeks.  Pt has been taking IBurofen, which does help a little.    HPI: Kaitlyn Solomon is a 54 y.o. female complains of 3-4 week h/o Lt low back pain that she describes as a "dull achy" in nature.  No new numbness or tingling in LLE. No LE weakness.  No injury or trauma. Ibuprofen 600-826m helps but does not resolve. Pt stands on concrete floors for entire work shift at [employer]   Pt also notes "rash" on upper back and posterior neck x months and now lateral neck and upper chest affected. No itch, pain, etc. Pt is not bothered by it.   Past Medical History:  Diagnosis Date  . Anxiety state, unspecified   . Depressive disorder, not elsewhere classified   . Family history of breast cancer 07/05/2011   Pt states she was BRCA neg about 2009  . Fibroid tumor   . Irregular menses 2007  . Irritable bowel syndrome   . Unspecified essential hypertension   . Vitamin D deficiency 06/2011    Past Surgical History:  Procedure Laterality Date  . ABDOMINAL HYSTERECTOMY  08/2008  . DILATION AND CURETTAGE OF UTERUS    . [place]CTION  1998    Social History   Socioeconomic History  . Marital status: Single    Spouse name: Not on file  . Number of children: Not on file  . Years of education: Not on file  . Highest education level: Not on file  Occupational History  . Occupation: FBuilding control surveyor UNEMPLOYED  Tobacco Use  . Smoking status: Former Smoker    Types: Cigarettes    Quit date: 01/23/1990    Years since quitting: 29.1  . Smokeless tobacco: Never Used  Substance and Sexual Activity  . Alcohol use: Yes    Comment: occ  . Drug use: No  . Sexual activity: Not Currently    Birth control/protection: Surgical    Other Topics Concern  . Not on file  Social History Narrative   Regular exercise: walks a lot at work   Caffeine use: yes   Social Determinants of Health   Financial Resource Strain:   . Difficulty of Paying Living Expenses: Not on file  Food Insecurity:   . Worried About RCharity fundraiserin the Last Year: Not on file  . Ran Out of Food in the Last Year: Not on file  Transportation Needs:   . Lack of Transportation (Medical): Not on file  . Lack of Transportation (Non-Medical): Not on file  Physical Activity:   . Days of Exercise per Week: Not on file  . Minutes of Exercise per Session: Not on file  Stress:   . Feeling of Stress : Not on file  Social Connections:   . Frequency of Communication with Friends and Family: Not on file  . Frequency of Social Gatherings with Friends and Family: Not on file  . Attends Religious Services: Not on file  . Active Member of Clubs or Organizations: Not on file  . Attends CArchivistMeetings: Not on file  . Marital Status: Not on file  Intimate Partner Violence:   . Fear of  Current or Ex-Partner: Not on file  . Emotionally Abused: Not on file  . Physically Abused: Not on file  . Sexually Abused: Not on file    Family History  Problem Relation Age of Onset  . Breast cancer Mother 90  . Hypertension Mother   . Diabetes Brother   . Hypertension Brother   . Breast cancer Maternal Grandmother   . Diabetes Maternal Grandmother   . Hypertension Maternal Grandmother   . Breast cancer Maternal Aunt   . Diabetes Maternal Aunt   . Breast cancer Maternal Aunt   . Diabetes Cousin   . Diabetes Cousin   . Hypertension Brother      Immunization History  Administered Date(s) Administered  . Hepatitis B 06/08/2010, 07/07/2010, 12/08/2010  . Influenza,inj,Quad PF,6+ Mos 04/08/2017, 01/23/2018  . Td 07/26/2004  . Tdap 04/08/2017    Outpatient Encounter Medications as of 03/06/2019  Medication Sig  . amLODipine (NORVASC) 5 MG  tablet Take 1 tablet by mouth twice daily  . cholecalciferol (VITAMIN D) 1000 UNITS tablet Take 1,000 Units by mouth daily.  . clonazePAM (KLONOPIN) 0.5 MG tablet TAKE 1 TABLET BY MOUTH EVERY DAY AS NEEDED  . gabapentin (NEURONTIN) 300 MG capsule Take 100 mg by mouth 2 (two) times daily.   Marland Kitchen HYDROcodone-acetaminophen (NORCO) 10-325 MG tablet 1q8h prn (#60 per 30d)  . ibuprofen (ADVIL,MOTRIN) 200 MG tablet Take 200 mg by mouth every 6 (six) hours as needed. Take 1-2 tabs tid prn  . levothyroxine (SYNTHROID) 75 MCG tablet TAKE 1 TABLET(75 MCG) BY MOUTH DAILY  . Multiple Vitamin (MULTIVITAMIN) tablet Take 1 tablet by mouth daily.  . traZODone (DESYREL) 50 MG tablet TAKE 1/2 TO 1 (ONE-HALF TO ONE) TABLET BY MOUTH AT BEDTIME AS NEEDED FOR SLEEP  . traMADol (ULTRAM) 50 MG tablet 1q8h prn (#60 per 30d) (Patient not taking: Reported on 03/06/2019)   No facility-administered encounter medications on file as of 03/06/2019.     ROS: Pertinent positives and negatives noted in HPI. Remainder of ROS non-contributory   Allergies  Allergen Reactions  . Lamotrigine Other (See Comments)    tremors     BP 130/90 (BP Location: Right Arm, Patient Position: Sitting, Cuff Size: Normal)   Pulse 69   Temp (!) 97.5 F (36.4 C) (Temporal)   Ht 5' 1" (1.549 m)   Wt 152 lb (68.9 kg)   SpO2 98%   BMI 28.72 kg/m   Physical Exam  Constitutional: She is oriented to person, place, and time. She appears well-developed and well-nourished. No distress.  Musculoskeletal:     Lumbar back: Spasms and tenderness present. No bony tenderness. Decreased range of motion.       Back:  Neurological: She is alert and oriented to person, place, and time. She exhibits normal muscle tone. Coordination normal.  Skin: Rash (upper back and neck with scattered hypopigmented macular rash) noted.     A/P:  1. Acute left-sided low back pain without sciatica 2. Musculoskeletal back pain - heat BID - exercises daily - included  in AVS and reviewed with pt Rx: - cyclobenzaprine (FLEXERIL) 5 MG tablet; Take 1 tablet (5 mg total) by mouth at bedtime as needed for muscle spasms.  Dispense: 30 tablet; Refill: 0 - pt will take qHS x 3-4 nights then PRN - naproxen (NAPROSYN) 500 MG tablet; Take 1 tablet (500 mg total) by mouth 2 (two) times daily with a meal.  Dispense: 30 tablet; Refill: 1 - pt will take BID w/ food  x 5-7 days - f/u if symptoms worsen or do not improve in 2-3 wks  3. Tinea versicolor Rx: - ketoconazole 2% cream to apply BID to affected area - f/u with PCP if no/minimal improvement in 2-3 wks  Discussed plan and reviewed medications with patient, including risks, benefits, and potential side effects. Pt expressed understand. All questions answered.  This visit occurred during the SARS-CoV-2 public health emergency.  Safety protocols were in place, including screening questions prior to the visit, additional usage of staff PPE, and extensive cleaning of exam room while observing appropriate contact time as indicated for disinfecting solutions.

## 2019-03-23 ENCOUNTER — Other Ambulatory Visit: Payer: Self-pay

## 2019-03-23 ENCOUNTER — Encounter (HOSPITAL_COMMUNITY): Payer: Self-pay | Admitting: Emergency Medicine

## 2019-03-23 ENCOUNTER — Emergency Department (HOSPITAL_COMMUNITY)
Admission: EM | Admit: 2019-03-23 | Discharge: 2019-03-24 | Disposition: A | Discharge status: Home / Self Care | Payer: 59 | Attending: Emergency Medicine | Admitting: Emergency Medicine

## 2019-03-23 DIAGNOSIS — Z5321 Procedure and treatment not carried out due to patient leaving prior to being seen by health care provider: Secondary | ICD-10-CM | POA: Insufficient documentation

## 2019-03-23 DIAGNOSIS — M25552 Pain in left hip: Secondary | ICD-10-CM | POA: Diagnosis not present

## 2019-03-23 NOTE — ED Triage Notes (Signed)
Patient reports persistent left lower back pain for several days with mild dysuria , denies injury , no hematuria , prescribed with Flexeril/Naproxen with no relief .

## 2019-03-24 LAB — COMPREHENSIVE METABOLIC PANEL
ALT: 21 U/L (ref 0–44)
AST: 21 U/L (ref 15–41)
Albumin: 4.2 g/dL (ref 3.5–5.0)
Alkaline Phosphatase: 54 U/L (ref 38–126)
Anion gap: 9 (ref 5–15)
BUN: 21 mg/dL — ABNORMAL HIGH (ref 6–20)
CO2: 24 mmol/L (ref 22–32)
Calcium: 9.3 mg/dL (ref 8.9–10.3)
Chloride: 105 mmol/L (ref 98–111)
Creatinine, Ser: 1.03 mg/dL — ABNORMAL HIGH (ref 0.44–1.00)
GFR calc Af Amer: >60 mL/min (ref >60)
GFR calc non Af Amer: >60 mL/min (ref >60)
Glucose, Bld: 88 mg/dL (ref 70–99)
Potassium: 3.9 mmol/L (ref 3.5–5.1)
Sodium: 138 mmol/L (ref 135–145)
Total Bilirubin: 0.8 mg/dL (ref 0.3–1.2)
Total Protein: 7.1 g/dL (ref 6.5–8.1)

## 2019-03-24 LAB — CBC WITH DIFFERENTIAL/PLATELET
Abs Immature Granulocytes: 0.02 10*3/uL (ref 0.00–0.07)
Basophils Absolute: 0.1 10*3/uL (ref 0.0–0.1)
Basophils Relative: 1 %
Eosinophils Absolute: 0.2 10*3/uL (ref 0.0–0.5)
Eosinophils Relative: 3 %
HCT: 45.3 % (ref 36.0–46.0)
Hemoglobin: 13.4 g/dL (ref 12.0–15.0)
Immature Granulocytes: 0 %
Lymphocytes Relative: 54 %
Lymphs Abs: 4.5 10*3/uL — ABNORMAL HIGH (ref 0.7–4.0)
MCH: 20.7 pg — ABNORMAL LOW (ref 26.0–34.0)
MCHC: 29.6 g/dL — ABNORMAL LOW (ref 30.0–36.0)
MCV: 70 fL — ABNORMAL LOW (ref 80.0–100.0)
Monocytes Absolute: 0.6 10*3/uL (ref 0.1–1.0)
Monocytes Relative: 7 %
Neutro Abs: 3 10*3/uL (ref 1.7–7.7)
Neutrophils Relative %: 35 %
Platelets: 369 10*3/uL (ref 150–400)
RBC: 6.47 MIL/uL — ABNORMAL HIGH (ref 3.87–5.11)
RDW: 18.9 % — ABNORMAL HIGH (ref 11.5–15.5)
WBC: 8.4 10*3/uL (ref 4.0–10.5)
nRBC: 0 % (ref 0.0–0.2)

## 2019-03-24 LAB — URINALYSIS, ROUTINE W REFLEX MICROSCOPIC
Bilirubin Urine: NEGATIVE
Glucose, UA: NEGATIVE mg/dL
Hgb urine dipstick: NEGATIVE
Ketones, ur: NEGATIVE mg/dL
Leukocytes,Ua: NEGATIVE
Nitrite: NEGATIVE
Protein, ur: NEGATIVE mg/dL
Specific Gravity, Urine: 1.011 (ref 1.005–1.030)
pH: 7 (ref 5.0–8.0)

## 2019-03-24 NOTE — ED Notes (Signed)
Pt stated she wanted to leave and get evaluated in am at [facility]. Let registration know before LWBS

## 2019-04-06 ENCOUNTER — Other Ambulatory Visit: Payer: Self-pay

## 2019-04-06 DIAGNOSIS — F339 Major depressive disorder, recurrent, unspecified: Secondary | ICD-10-CM

## 2019-04-06 NOTE — Telephone Encounter (Signed)
Requesting: Clonazepam Contract: Yes UDS: UTD Last OV: 1.22.21 Next OV: Not scheduled Last Refill: 1.12.2021   Please advise

## 2019-04-08 MED ORDER — CLONAZEPAM 0.5 MG PO TABS: 0.5000 mg | ORAL_TABLET | Freq: Every day | ORAL | 1 refills | Status: DC | PRN

## 2019-04-10 ENCOUNTER — Encounter: Payer: Self-pay | Admitting: Obstetrics & Gynecology

## 2019-04-10 ENCOUNTER — Other Ambulatory Visit: Payer: Self-pay

## 2019-04-10 ENCOUNTER — Ambulatory Visit (INDEPENDENT_AMBULATORY_CARE_PROVIDER_SITE_OTHER): Payer: 59 | Admitting: Obstetrics & Gynecology

## 2019-04-10 ENCOUNTER — Other Ambulatory Visit (HOSPITAL_COMMUNITY)
Admission: RE | Admit: 2019-04-10 | Discharge: 2019-04-10 | Disposition: A | Discharge status: Home / Self Care | Payer: 59 | Source: Ambulatory Visit | Attending: Obstetrics & Gynecology | Admitting: Obstetrics & Gynecology

## 2019-04-10 VITALS — BP 120/70 | Ht 61.5 in | Wt 154.0 lb

## 2019-04-10 DIAGNOSIS — M545 Low back pain, unspecified: Secondary | ICD-10-CM | POA: Insufficient documentation

## 2019-04-10 DIAGNOSIS — G8929 Other chronic pain: Secondary | ICD-10-CM | POA: Insufficient documentation

## 2019-04-10 DIAGNOSIS — N761 Subacute and chronic vaginitis: Secondary | ICD-10-CM

## 2019-04-10 DIAGNOSIS — Z1231 Encounter for screening mammogram for malignant neoplasm of breast: Secondary | ICD-10-CM

## 2019-04-10 DIAGNOSIS — R300 Dysuria: Secondary | ICD-10-CM | POA: Diagnosis not present

## 2019-04-10 DIAGNOSIS — Z90711 Acquired absence of uterus with remaining cervical stump: Secondary | ICD-10-CM

## 2019-04-10 DIAGNOSIS — Z124 Encounter for screening for malignant neoplasm of cervix: Secondary | ICD-10-CM

## 2019-04-10 LAB — POCT URINALYSIS DIPSTICK
Bilirubin, UA: NEGATIVE
Blood, UA: NEGATIVE
Glucose, UA: NEGATIVE
Ketones, UA: NEGATIVE
Leukocytes, UA: NEGATIVE
Nitrite, UA: NEGATIVE
Protein, UA: NEGATIVE
Spec Grav, UA: 1.01 (ref 1.010–1.025)
Urobilinogen, UA: 0.2 E.U./dL
pH, UA: 5 (ref 5.0–8.0)

## 2019-04-10 NOTE — Patient Instructions (Signed)
PAP every three years Mammogram every year    Call [phone] to schedule at [facility] pending Physical therapy to be arranged

## 2019-04-10 NOTE — Progress Notes (Signed)
CC: Pain Patient is a 54 yo AA F s/p LSH who presents for evaluation of low back pain and also dysuria for 2 months. The pain is described as aching and pressure-like, and is 6/10 in intensity. Pain is located in the lumbar area without radiation. L>R at times. Onset was gradual occurring 2 months ago. Symptoms have been unchanged since. Aggravating factors: activity and standing. Alleviating factors: none. Associated symptoms: dysuria and she has had prior UA and exams without infection determined.  She has been treated w a muscle relaxant and NSAIDs, no help.  She has been treated for BV once, no help.  Denies vag d/c. The patient denies chills, constipation, diarrhea, fever, nausea and vomiting. Risk factors for pelvic/abdominal pain include none.   PMHx: She  has a past medical history of Anxiety state, unspecified, Depressive disorder, not elsewhere classified, Family history of breast cancer (07/05/2011), Fibroid tumor, Irregular menses (2007), Irritable bowel syndrome, Unspecified essential hypertension, and Vitamin D deficiency (06/2011). Also,  has a past surgical history that includes Dilation and curettage of uterus; Abdominal hysterectomy (08/2008); and Wisdom tooth extraction (1998)., family history includes Breast cancer in her maternal aunt, maternal aunt, and maternal grandmother; Breast cancer (age of onset: 16) in her mother; Diabetes in her brother, cousin, cousin, maternal aunt, and maternal grandmother; Hypertension in her brother, brother, maternal grandmother, and mother.,  reports that she quit smoking about 29 years ago. Her smoking use included cigarettes. She has never used smokeless tobacco. She reports current alcohol use. She reports that she does not use drugs.  She has a current medication list which includes the following prescription(s): amlodipine, cholecalciferol, clonazepam, cyclobenzaprine, gabapentin, hydrocodone-acetaminophen, ibuprofen, ketoconazole, levothyroxine,  multivitamin, naproxen, tramadol, and trazodone. Also, is allergic to lamotrigine.  Review of Systems  Constitutional: Positive for malaise/fatigue. Negative for chills and fever.  HENT: Negative for congestion, sinus pain and sore throat.   Eyes: Negative for blurred vision and pain.  Respiratory: Negative for cough and wheezing.   Cardiovascular: Negative for chest pain and leg swelling.  Gastrointestinal: Positive for nausea. Negative for abdominal pain, constipation, diarrhea, heartburn and vomiting.  Genitourinary: Positive for dysuria. Negative for frequency, hematuria and urgency.  Musculoskeletal: Negative for back pain, joint pain, myalgias and neck pain.  Skin: Negative for itching and rash.  Neurological: Negative for dizziness, tremors and weakness.  Endo/Heme/Allergies: Does not bruise/bleed easily.  Psychiatric/Behavioral: Negative for depression. The patient is nervous/anxious. The patient does not have insomnia.     Objective: BP 120/70   Ht 5' 1.5" (1.562 m)   Wt 154 lb (69.9 kg)   BMI 28.63 kg/m  Physical Exam Constitutional:      General: She is not in acute distress.    Appearance: She is well-developed.  Genitourinary:     Pelvic exam was performed with patient supine.     Urethra, bladder, vagina, cervix and rectum normal.     No lesions in the vagina.     No vaginal bleeding.     No cervical polyp.     Uterus is absent.     No right or left adnexal mass present.     Right adnexa not tender.     Left adnexa not tender.     Genitourinary Comments: No pelvic mass  HENT:     Head: Normocephalic and atraumatic. No laceration.     Right Ear: Hearing normal.     Left Ear: Hearing normal.     Mouth/Throat:     Pharynx:  Uvula midline.  Eyes:     Pupils: Pupils are equal, round, and reactive to light.  Neck:     Thyroid: No thyromegaly.  Cardiovascular:     Rate and Rhythm: Normal rate and regular rhythm.     Heart sounds: No murmur. No friction rub. No  gallop.   Pulmonary:     Effort: Pulmonary effort is normal. No respiratory distress.     Breath sounds: Normal breath sounds. No wheezing.  Abdominal:     General: Bowel sounds are normal. There is no distension.     Palpations: Abdomen is soft.     Tenderness: There is no abdominal tenderness. There is no rebound.     Comments: BACK: No CVAT  Musculoskeletal:        General: Normal range of motion.     Cervical back: Normal range of motion and neck supple.  Neurological:     Mental Status: She is alert and oriented to person, place, and time.     Cranial Nerves: No cranial nerve deficit.  Skin:    General: Skin is warm and dry.  Psychiatric:        Judgment: Judgment normal.  Vitals reviewed.    Results for orders placed or performed in visit on 04/10/19  POCT urinalysis dipstick  Result Value Ref Range   Color, UA     Clarity, UA     Glucose, UA Negative Negative   Bilirubin, UA neg    Ketones, UA neg    Spec Grav, UA 1.010 1.010 - 1.025   Blood, UA neg    pH, UA 5.0 5.0 - 8.0   Protein, UA Negative Negative   Urobilinogen, UA 0.2 0.2 or 1.0 E.U./dL   Nitrite, UA neg'    Leukocytes, UA Negative Negative   Appearance     Odor       ASSESSMENT/PLAN Problem List Items Addressed This Visit    Chronic low back pain without sciatica   No signs of GU infection or etiology No signs of pelvic mass or other GYN etiology Physical therapy discussed NSAIDs      Dysuria       Relevant Orders   POCT urinalysis dipstick (Completed)   Urine Culture   Screening for cervical cancer       PAP      Subacute vaginitis       Relevant Orders   Cervicovaginal ancillary only   Encounter for screening mammogram for malignant neoplasm of breast       Relevant Orders   MM 3D SCREEN BREAST BILATERAL    A total of 40 minutes were spent face-to-face with the patient as well as preparation, review, communication, and documentation during this encounter.    Barnett Applebaum, MD, [facility], [facility] 04/10/2019  9:13 AM

## 2019-04-12 LAB — URINE CULTURE: Organism ID, Bacteria: NO GROWTH

## 2019-04-13 ENCOUNTER — Other Ambulatory Visit: Payer: Self-pay

## 2019-04-13 LAB — CYTOLOGY - PAP
Adequacy: ABSENT
Chlamydia: NEGATIVE
Comment: NEGATIVE
Comment: NORMAL
Diagnosis: NEGATIVE
Neisseria Gonorrhea: NEGATIVE

## 2019-04-13 LAB — CERVICOVAGINAL ANCILLARY ONLY
Bacterial Vaginitis (gardnerella): POSITIVE — AB
Candida Glabrata: NEGATIVE
Candida Vaginitis: NEGATIVE
Comment: NEGATIVE
Comment: NEGATIVE
Comment: NEGATIVE

## 2019-04-13 MED ORDER — AMLODIPINE BESYLATE 5 MG PO TABS: 5.0000 mg | ORAL_TABLET | Freq: Two times a day (BID) | ORAL | 0 refills | Status: DC

## 2019-04-14 ENCOUNTER — Other Ambulatory Visit: Payer: Self-pay | Admitting: Obstetrics & Gynecology

## 2019-04-14 MED ORDER — METRONIDAZOLE 0.75 % VA GEL: 1.0000 | Freq: Every day | VAGINAL | 0 refills | Status: AC

## 2019-04-15 ENCOUNTER — Ambulatory Visit: Payer: 59 | Admitting: Physical Therapy

## 2019-04-22 ENCOUNTER — Ambulatory Visit: Payer: 59 | Attending: Obstetrics & Gynecology | Admitting: Physical Therapy

## 2019-05-14 ENCOUNTER — Ambulatory Visit: Payer: 59 | Admitting: Family Medicine

## 2019-05-14 DIAGNOSIS — Z0289 Encounter for other administrative examinations: Secondary | ICD-10-CM

## 2019-05-18 ENCOUNTER — Encounter: Payer: Self-pay | Admitting: Family Medicine

## 2019-05-18 ENCOUNTER — Ambulatory Visit (INDEPENDENT_AMBULATORY_CARE_PROVIDER_SITE_OTHER): Payer: 59 | Admitting: Family Medicine

## 2019-05-18 ENCOUNTER — Other Ambulatory Visit: Payer: Self-pay

## 2019-05-18 ENCOUNTER — Other Ambulatory Visit (HOSPITAL_COMMUNITY)
Admission: RE | Admit: 2019-05-18 | Discharge: 2019-05-18 | Disposition: A | Discharge status: Home / Self Care | Payer: 59 | Source: Ambulatory Visit | Attending: Family Medicine | Admitting: Family Medicine

## 2019-05-18 ENCOUNTER — Ambulatory Visit (INDEPENDENT_AMBULATORY_CARE_PROVIDER_SITE_OTHER): Payer: 59

## 2019-05-18 VITALS — BP 124/80 | HR 80 | Temp 98.2°F | Ht 61.0 in | Wt 155.4 lb

## 2019-05-18 DIAGNOSIS — M545 Low back pain, unspecified: Secondary | ICD-10-CM

## 2019-05-18 DIAGNOSIS — R300 Dysuria: Secondary | ICD-10-CM | POA: Insufficient documentation

## 2019-05-18 LAB — URINALYSIS, ROUTINE W REFLEX MICROSCOPIC
Bilirubin Urine: NEGATIVE
Hgb urine dipstick: NEGATIVE
Ketones, ur: NEGATIVE
Leukocytes,Ua: NEGATIVE
Nitrite: NEGATIVE
RBC / HPF: NONE SEEN (ref 0–?)
Specific Gravity, Urine: 1.02 (ref 1.000–1.030)
Total Protein, Urine: NEGATIVE
Urine Glucose: NEGATIVE
Urobilinogen, UA: 0.2 (ref 0.0–1.0)
pH: 7 (ref 5.0–8.0)

## 2019-05-18 NOTE — Progress Notes (Signed)
Established Patient Office Visit  Subjective:  Patient ID: Kaitlyn Solomon, female    DOB: 1965/03/03  Age: 54 y.o. MRN: [id number]  CC:  Chief Complaint  Patient presents with  . Pain    c/o back pains and bacterial vaginosis x 2-3 months will not go away. Also states that she had pain in vagina last night at work.     HPI Kaitlyn Solomon presents for follow-up of her lower back pain.  This is been present since approximately December.  Pain is concentrated on the left lower back area.  It sometimes moves up the spine.  There is no radiation into the legs.  Patient denies weakness numbness or tingling in her crotch area.  She does have some paresthesias in the right foot.  There is no change in her bowel or bladder habits.  However she has been experiencing dysuria.  She has frequency because she takes blood pressure medicine she tells me.  There is no vaginal discharge.  She is status post hysterectomy for uterine fibroids.  She has not been sexually active in over 2 years.  Her job requires standing and her back tends to bother her after she stands for period of time.  Past Medical History:  Diagnosis Date  . Anxiety state, unspecified   . Depressive disorder, not elsewhere classified   . Family history of breast cancer 07/05/2011   Pt states she was BRCA neg about 2009  . Fibroid tumor   . Irregular menses 2007  . Irritable bowel syndrome   . Unspecified essential hypertension   . Vitamin D deficiency 06/2011    Past Surgical History:  Procedure Laterality Date  . ABDOMINAL HYSTERECTOMY  08/2008  . DILATION AND CURETTAGE OF UTERUS    . WISDOM TOOTH EXTRACTION  1998    Family History  Problem Relation Age of Onset  . Breast cancer Mother 53  . Hypertension Mother   . Diabetes Brother   . Hypertension Brother   . Breast cancer Maternal Grandmother   . Diabetes Maternal Grandmother   . Hypertension Maternal Grandmother   . Breast cancer Maternal Aunt   . Diabetes  Maternal Aunt   . Breast cancer Maternal Aunt   . Diabetes Cousin   . Diabetes Cousin   . Hypertension Brother     Social History   Socioeconomic History  . Marital status: Single    Spouse name: Not on file  . Number of children: Not on file  . Years of education: Not on file  . Highest education level: Not on file  Occupational History  . Occupation: Building control surveyor: UNEMPLOYED  Tobacco Use  . Smoking status: Former Smoker    Types: Cigarettes    Quit date: 01/23/1990    Years since quitting: 29.3  . Smokeless tobacco: Never Used  Substance and Sexual Activity  . Alcohol use: Yes    Comment: occ  . Drug use: No  . Sexual activity: Not Currently    Birth control/protection: Surgical  Other Topics Concern  . Not on file  Social History Narrative   Regular exercise: walks a lot at work   Caffeine use: yes   Social Determinants of Health   Financial Resource Strain:   . Difficulty of Paying Living Expenses:   Food Insecurity:   . Worried About Charity fundraiser in the Last Year:   . Arboriculturist in the Last Year:   Transportation Needs:   .  Lack of Transportation (Medical):   Marland Kitchen Lack of Transportation (Non-Medical):   Physical Activity:   . Days of Exercise per Week:   . Minutes of Exercise per Session:   Stress:   . Feeling of Stress :   Social Connections:   . Frequency of Communication with Friends and Family:   . Frequency of Social Gatherings with Friends and Family:   . Attends Religious Services:   . Active Member of Clubs or Organizations:   . Attends Archivist Meetings:   Marland Kitchen Marital Status:   Intimate Partner Violence:   . Fear of Current or Ex-Partner:   . Emotionally Abused:   Marland Kitchen Physically Abused:   . Sexually Abused:     Outpatient Medications Prior to Visit  Medication Sig Dispense Refill  . amLODipine (NORVASC) 5 MG tablet Take 1 tablet (5 mg total) by mouth 2 (two) times daily. (plz sched with new provider)  180 tablet 0  . cholecalciferol (VITAMIN D) 1000 UNITS tablet Take 1,000 Units by mouth daily.    . clonazePAM (KLONOPIN) 0.5 MG tablet Take 1 tablet (0.5 mg total) by mouth daily as needed. 30 tablet 1  . cyclobenzaprine (FLEXERIL) 5 MG tablet Take 1 tablet (5 mg total) by mouth at bedtime as needed for muscle spasms. 30 tablet 0  . ibuprofen (ADVIL,MOTRIN) 200 MG tablet Take 200 mg by mouth every 6 (six) hours as needed. Take 1-2 tabs tid prn    . ketoconazole (NIZORAL) 2 % cream Apply 1 application topically 2 (two) times daily. 30 g 1  . levothyroxine (SYNTHROID) 75 MCG tablet TAKE 1 TABLET(75 MCG) BY MOUTH DAILY 90 tablet 2  . Multiple Vitamin (MULTIVITAMIN) tablet Take 1 tablet by mouth daily.    . naproxen (NAPROSYN) 500 MG tablet Take 1 tablet (500 mg total) by mouth 2 (two) times daily with a meal. 30 tablet 1  . traZODone (DESYREL) 50 MG tablet TAKE 1/2 TO 1 (ONE-HALF TO ONE) TABLET BY MOUTH AT BEDTIME AS NEEDED FOR SLEEP 90 tablet 0  . gabapentin (NEURONTIN) 300 MG capsule Take 100 mg by mouth 2 (two) times daily.     Marland Kitchen HYDROcodone-acetaminophen (NORCO) 10-325 MG tablet 1q8h prn (#60 per 30d) (Patient not taking: Reported on 05/18/2019) 60 tablet 0  . traMADol (ULTRAM) 50 MG tablet 1q8h prn (#60 per 30d) (Patient not taking: Reported on 05/18/2019) 60 tablet 0   No facility-administered medications prior to visit.    Allergies  Allergen Reactions  . Lamotrigine Other (See Comments)    tremors     ROS Review of Systems  Constitutional: Negative.   HENT: Negative.   Eyes: Negative for photophobia and visual disturbance.  Respiratory: Negative.   Cardiovascular: Negative.   Gastrointestinal: Negative.  Negative for anal bleeding, blood in stool and nausea.  Endocrine: Negative for polyphagia and polyuria.  Genitourinary: Positive for dysuria and frequency. Negative for difficulty urinating, urgency and vaginal discharge.  Musculoskeletal: Positive for back pain and myalgias.    Skin: Negative.   Neurological: Negative for weakness.  Hematological: Does not bruise/bleed easily.  Psychiatric/Behavioral: Negative.       Objective:    Physical Exam  Constitutional: She is oriented to person, place, and time. She appears well-developed and well-nourished. No distress.  HENT:  Head: Normocephalic and atraumatic.  Right Ear: External ear normal.  Left Ear: External ear normal.  Eyes: Conjunctivae are normal. Right eye exhibits no discharge. Left eye exhibits no discharge. No scleral icterus.  Neck:  No JVD present. No tracheal deviation present. No thyromegaly present.  Cardiovascular: Normal rate, regular rhythm and normal heart sounds.  Pulmonary/Chest: Effort normal and breath sounds normal. No stridor.  Abdominal: Bowel sounds are normal.  Musculoskeletal:     Lumbar back: No tenderness or bony tenderness. Normal range of motion.  Lymphadenopathy:    She has no cervical adenopathy.  Neurological: She is alert and oriented to person, place, and time. She has normal strength.  Reflex Scores:      Patellar reflexes are 1+ on the right side and 1+ on the left side.      Achilles reflexes are 1+ on the right side and 1+ on the left side. Negative dural tension signs.   Skin: Skin is warm and dry. She is not diaphoretic.  Psychiatric: She has a normal mood and affect. Her behavior is normal.    BP 124/80   Pulse 80   Temp 98.2 F (36.8 C) (Tympanic)   Ht '5\' 1"'  (1.549 m)   Wt 155 lb 6.4 oz (70.5 kg)   SpO2 95%   BMI 29.36 kg/m  Wt Readings from Last 3 Encounters:  05/18/19 155 lb 6.4 oz (70.5 kg)  04/10/19 154 lb (69.9 kg)  03/06/19 152 lb (68.9 kg)     Health Maintenance Due  Topic Date Due  . MAMMOGRAM  08/31/2013    There are no preventive care reminders to display for this patient.  Lab Results  Component Value Date   TSH 2.33 10/02/2018   Lab Results  Component Value Date   WBC 8.4 03/23/2019   HGB 13.4 03/23/2019   HCT 45.3  03/23/2019   MCV 70.0 (L) 03/23/2019   PLT 369 03/23/2019   Lab Results  Component Value Date   NA 138 03/23/2019   K 3.9 03/23/2019   CO2 24 03/23/2019   GLUCOSE 88 03/23/2019   BUN 21 (H) 03/23/2019   CREATININE 1.03 (H) 03/23/2019   BILITOT 0.8 03/23/2019   ALKPHOS 54 03/23/2019   AST 21 03/23/2019   ALT 21 03/23/2019   PROT 7.1 03/23/2019   ALBUMIN 4.2 03/23/2019   CALCIUM 9.3 03/23/2019   ANIONGAP 9 03/23/2019   GFR 92.80 08/11/2018   Lab Results  Component Value Date   CHOL 133 08/11/2018   Lab Results  Component Value Date   HDL 48.10 08/11/2018   Lab Results  Component Value Date   LDLCALC 72 08/11/2018   Lab Results  Component Value Date   TRIG 66.0 08/11/2018   Lab Results  Component Value Date   CHOLHDL 3 08/11/2018   Lab Results  Component Value Date   HGBA1C 6.1 11/17/2015      Assessment & Plan:   Problem List Items Addressed This Visit      Other   Left-sided low back pain without sciatica   Relevant Orders   DG Lumbar Spine Complete   Ambulatory referral to Sports Medicine   Dysuria - Primary   Relevant Orders   Urinalysis, Routine w reflex microscopic   Urine Culture   Urine cytology ancillary only      No orders of the defined types were placed in this encounter.   Follow-up: Return in about 2 months (around 07/18/2019), or if symptoms worsen or fail to improve.    Libby Maw, MD

## 2019-05-19 LAB — URINE CULTURE
MICRO NUMBER:: 10327475
SPECIMEN QUALITY:: ADEQUATE

## 2019-05-22 ENCOUNTER — Emergency Department (HOSPITAL_COMMUNITY)
Admission: EM | Admit: 2019-05-22 | Discharge: 2019-05-22 | Discharge status: Against Medical Advice | Payer: 59 | Attending: Emergency Medicine | Admitting: Emergency Medicine

## 2019-05-22 ENCOUNTER — Other Ambulatory Visit: Payer: Self-pay

## 2019-05-22 ENCOUNTER — Emergency Department (HOSPITAL_COMMUNITY): Payer: 59

## 2019-05-22 ENCOUNTER — Encounter (HOSPITAL_COMMUNITY): Payer: Self-pay

## 2019-05-22 DIAGNOSIS — Z5321 Procedure and treatment not carried out due to patient leaving prior to being seen by health care provider: Secondary | ICD-10-CM | POA: Insufficient documentation

## 2019-05-22 DIAGNOSIS — R079 Chest pain, unspecified: Secondary | ICD-10-CM | POA: Insufficient documentation

## 2019-05-22 LAB — TROPONIN I (HIGH SENSITIVITY): Troponin I (High Sensitivity): 4 ng/L (ref <18)

## 2019-05-22 LAB — CBC
HCT: 46 % (ref 36.0–46.0)
Hemoglobin: 13.2 g/dL (ref 12.0–15.0)
MCH: 20.2 pg — ABNORMAL LOW (ref 26.0–34.0)
MCHC: 28.7 g/dL — ABNORMAL LOW (ref 30.0–36.0)
MCV: 70.3 fL — ABNORMAL LOW (ref 80.0–100.0)
Platelets: 369 10*3/uL (ref 150–400)
RBC: 6.54 MIL/uL — ABNORMAL HIGH (ref 3.87–5.11)
RDW: 18.6 % — ABNORMAL HIGH (ref 11.5–15.5)
WBC: 6.1 10*3/uL (ref 4.0–10.5)
nRBC: 0 % (ref 0.0–0.2)

## 2019-05-22 LAB — URINE CYTOLOGY ANCILLARY ONLY
Bacterial Vaginitis-Urine: NEGATIVE
Chlamydia: NEGATIVE
Comment: NEGATIVE

## 2019-05-22 LAB — BASIC METABOLIC PANEL
Anion gap: 14 (ref 5–15)
BUN: 18 mg/dL (ref 6–20)
CO2: 24 mmol/L (ref 22–32)
Calcium: 9.6 mg/dL (ref 8.9–10.3)
Chloride: 101 mmol/L (ref 98–111)
Creatinine, Ser: 0.78 mg/dL (ref 0.44–1.00)
GFR calc Af Amer: >60 mL/min (ref >60)
GFR calc non Af Amer: >60 mL/min (ref >60)
Glucose, Bld: 87 mg/dL (ref 70–99)
Potassium: 3.7 mmol/L (ref 3.5–5.1)
Sodium: 139 mmol/L (ref 135–145)

## 2019-05-22 NOTE — ED Notes (Signed)
Called for Patient no answer.

## 2019-05-22 NOTE — ED Triage Notes (Signed)
Pt reports chest pressure since yesterday, took multiple antacids without relief.states shes started burping this morning that helped relieve some pain. Pt a.o, nad noted.

## 2019-05-22 NOTE — ED Notes (Signed)
Called x 3 no answer 

## 2019-05-26 ENCOUNTER — Other Ambulatory Visit: Payer: Self-pay

## 2019-05-26 ENCOUNTER — Ambulatory Visit: Payer: 59 | Admitting: Family Medicine

## 2019-05-26 DIAGNOSIS — G8929 Other chronic pain: Secondary | ICD-10-CM | POA: Diagnosis not present

## 2019-05-26 DIAGNOSIS — M545 Low back pain, unspecified: Secondary | ICD-10-CM
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
## 2019-05-26 NOTE — Patient Instructions (Signed)
Nice to meet you  Please try heat  Please try the exercises  Please try a thera cane  Please let me know if you would like to try physical therapy at any point.  Please send me a message in MyChart with any questions or updates.  Please see me back in 4 weeks.   --Dr. Raeford Razor
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
## 2019-06-23 ENCOUNTER — Ambulatory Visit: Payer: 59 | Admitting: Family Medicine

## 2019-07-21 ENCOUNTER — Ambulatory Visit: Payer: 59 | Admitting: Family Medicine

## 2019-07-29 ENCOUNTER — Telehealth: Payer: Self-pay

## 2019-07-29 NOTE — Telephone Encounter (Signed)
-----   Message from Gae Dry, MD sent at 07/06/2019  7:52 AM EDT ----- Regarding: MMG Received notice she has not received MMG yet as ordered at her Annual. Please check and encourage her to do this, and document conversation.

## 2019-07-29 NOTE — Telephone Encounter (Signed)
Pt aware to schedule mammogram 

## 2019-08-03 ENCOUNTER — Telehealth: Payer: Self-pay | Admitting: Family Medicine

## 2019-08-03 NOTE — Telephone Encounter (Signed)
Patient needs refill of levothyroxine. Please call to same [facility] in [place]. She also asked for refills of Hydrocodone and Tramadol that Dr. Deborra Medina prescribed her last year.

## 2019-08-04 ENCOUNTER — Other Ambulatory Visit: Payer: Self-pay

## 2019-08-04 DIAGNOSIS — F339 Major depressive disorder, recurrent, unspecified: Secondary | ICD-10-CM

## 2019-08-04 DIAGNOSIS — E039 Hypothyroidism, unspecified: Secondary | ICD-10-CM

## 2019-08-04 MED ORDER — TRAZODONE HCL 50 MG PO TABS: ORAL_TABLET | ORAL | 0 refills | Status: DC

## 2019-08-04 MED ORDER — LEVOTHYROXINE SODIUM 75 MCG PO TABS: ORAL_TABLET | ORAL | 2 refills | Status: DC

## 2019-08-04 NOTE — Telephone Encounter (Signed)
Patient calling for refill on pending medications. Last OV 05/18/19 please advise.

## 2019-08-04 NOTE — Telephone Encounter (Signed)
Patient is calling again for refill of levothyroxine. She states she ran out 2 days ago and the pharmacy has also attempted to contact us.

## 2019-08-04 NOTE — Telephone Encounter (Signed)
Called patient to let her know that Levothyroxine was sent to pharmacy, no answer unable to LM

## 2019-08-06 DIAGNOSIS — T22092S Burn of unspecified degree of multiple sites of left shoulder and upper limb, except wrist and hand, sequela: Secondary | ICD-10-CM | POA: Diagnosis not present

## 2019-08-06 DIAGNOSIS — T22391A Burn of third degree of multiple sites of right shoulder and upper limb, except wrist and hand, initial encounter: Secondary | ICD-10-CM | POA: Diagnosis not present

## 2019-08-06 DIAGNOSIS — T22091S Burn of unspecified degree of multiple sites of right shoulder and upper limb, except wrist and hand, sequela: Secondary | ICD-10-CM | POA: Diagnosis not present

## 2019-08-06 DIAGNOSIS — F419 Anxiety disorder, unspecified: Secondary | ICD-10-CM | POA: Diagnosis not present

## 2019-08-06 DIAGNOSIS — T24091S Burn of unspecified degree of multiple sites of right lower limb, except ankle and foot, sequela: Secondary | ICD-10-CM | POA: Diagnosis not present

## 2019-08-06 DIAGNOSIS — I10 Essential (primary) hypertension: Secondary | ICD-10-CM | POA: Diagnosis not present

## 2019-08-06 DIAGNOSIS — L905 Scar conditions and fibrosis of skin: Secondary | ICD-10-CM | POA: Diagnosis not present

## 2019-08-06 DIAGNOSIS — M792 Neuralgia and neuritis, unspecified: Secondary | ICD-10-CM | POA: Diagnosis not present

## 2019-08-06 DIAGNOSIS — Z885 Allergy status to narcotic agent status: Secondary | ICD-10-CM | POA: Diagnosis not present

## 2019-08-06 DIAGNOSIS — L910 Hypertrophic scar: Secondary | ICD-10-CM | POA: Diagnosis not present

## 2019-08-06 DIAGNOSIS — Z87891 Personal history of nicotine dependence: Secondary | ICD-10-CM | POA: Diagnosis not present

## 2019-08-06 DIAGNOSIS — F329 Major depressive disorder, single episode, unspecified: Secondary | ICD-10-CM | POA: Diagnosis not present

## 2019-08-06 DIAGNOSIS — K589 Irritable bowel syndrome without diarrhea: Secondary | ICD-10-CM | POA: Diagnosis not present

## 2019-08-06 DIAGNOSIS — T22392A Burn of third degree of multiple sites of left shoulder and upper limb, except wrist and hand, initial encounter: Secondary | ICD-10-CM | POA: Diagnosis not present

## 2019-08-18 ENCOUNTER — Ambulatory Visit
Admission: RE | Admit: 2019-08-18 | Discharge: 2019-08-18 | Disposition: A | Discharge status: Home / Self Care | Payer: Federal, State, Local not specified - PPO | Source: Ambulatory Visit | Attending: Obstetrics & Gynecology | Admitting: Obstetrics & Gynecology

## 2019-08-18 DIAGNOSIS — Z1231 Encounter for screening mammogram for malignant neoplasm of breast: Secondary | ICD-10-CM

## 2019-08-19 ENCOUNTER — Other Ambulatory Visit: Payer: Self-pay | Admitting: Obstetrics & Gynecology

## 2019-08-19 DIAGNOSIS — R928 Other abnormal and inconclusive findings on diagnostic imaging of breast: Secondary | ICD-10-CM

## 2019-08-19 DIAGNOSIS — N6310 Unspecified lump in the right breast, unspecified quadrant: Secondary | ICD-10-CM

## 2019-08-24 DIAGNOSIS — I998 Other disorder of circulatory system: Secondary | ICD-10-CM | POA: Diagnosis not present

## 2019-08-24 DIAGNOSIS — L910 Hypertrophic scar: Secondary | ICD-10-CM | POA: Diagnosis not present

## 2019-08-26 ENCOUNTER — Ambulatory Visit
Admission: RE | Admit: 2019-08-26 | Discharge: 2019-08-26 | Disposition: A | Discharge status: Home / Self Care | Payer: Federal, State, Local not specified - PPO | Source: Ambulatory Visit | Attending: Obstetrics & Gynecology | Admitting: Obstetrics & Gynecology

## 2019-08-26 DIAGNOSIS — N6310 Unspecified lump in the right breast, unspecified quadrant: Secondary | ICD-10-CM

## 2019-08-26 DIAGNOSIS — R928 Other abnormal and inconclusive findings on diagnostic imaging of breast: Secondary | ICD-10-CM | POA: Insufficient documentation

## 2019-08-28 ENCOUNTER — Ambulatory Visit
Admission: RE | Admit: 2019-08-28 | Discharge: 2019-08-28 | Disposition: A | Discharge status: Home / Self Care | Payer: Federal, State, Local not specified - PPO | Source: Ambulatory Visit | Attending: Obstetrics & Gynecology | Admitting: Obstetrics & Gynecology

## 2019-08-28 DIAGNOSIS — R928 Other abnormal and inconclusive findings on diagnostic imaging of breast: Secondary | ICD-10-CM | POA: Insufficient documentation

## 2019-08-28 DIAGNOSIS — N6310 Unspecified lump in the right breast, unspecified quadrant: Secondary | ICD-10-CM | POA: Diagnosis not present

## 2019-08-28 DIAGNOSIS — N6011 Diffuse cystic mastopathy of right breast: Secondary | ICD-10-CM | POA: Diagnosis not present

## 2019-09-08 ENCOUNTER — Telehealth: Payer: Self-pay | Admitting: Family Medicine

## 2019-09-08 NOTE — Telephone Encounter (Signed)
Patient states that she left her pain medication at a hotel she stayed at out of town. She wants to know if she can get a refill/prescription sent to her pharmacy. She is aware that her provider is out of the office this week. Please call her back at [phone] if you have a questions.  Thank you

## 2019-09-14 ENCOUNTER — Other Ambulatory Visit: Payer: Self-pay

## 2019-09-15 ENCOUNTER — Ambulatory Visit (INDEPENDENT_AMBULATORY_CARE_PROVIDER_SITE_OTHER): Payer: Federal, State, Local not specified - PPO

## 2019-09-15 ENCOUNTER — Encounter: Payer: Self-pay | Admitting: Family Medicine

## 2019-09-15 ENCOUNTER — Ambulatory Visit: Payer: Federal, State, Local not specified - PPO | Admitting: Family Medicine

## 2019-09-15 VITALS — BP 120/78 | HR 65 | Temp 97.7°F | Ht 61.0 in | Wt 157.8 lb

## 2019-09-15 DIAGNOSIS — M545 Low back pain, unspecified: Secondary | ICD-10-CM

## 2019-09-15 DIAGNOSIS — M25572 Pain in left ankle and joints of left foot: Secondary | ICD-10-CM

## 2019-09-15 DIAGNOSIS — E039 Hypothyroidism, unspecified: Secondary | ICD-10-CM | POA: Diagnosis not present

## 2019-09-15 DIAGNOSIS — F418 Other specified anxiety disorders: Secondary | ICD-10-CM | POA: Diagnosis not present

## 2019-09-15 DIAGNOSIS — I10 Essential (primary) hypertension: Secondary | ICD-10-CM

## 2019-09-15 HISTORY — DX: Pain in left ankle and joints of left foot: M25.572

## 2019-09-15 MED ORDER — CITALOPRAM HYDROBROMIDE 20 MG PO TABS: 20.0000 mg | ORAL_TABLET | Freq: Every day | ORAL | 1 refills | Status: DC

## 2019-09-15 NOTE — Telephone Encounter (Signed)
Patient has an appointment.

## 2019-09-15 NOTE — Progress Notes (Signed)
Established Patient Office Visit  Subjective:  Patient ID: Kaitlyn Solomon, female    DOB: February 20, 1965  Age: 54 y.o. MRN: [id number]  CC:  Chief Complaint  Patient presents with  . Pain    hip pain, left ankle and hip pain x 1 month. Concerns about anxiety issues     HPI Kaitlyn Solomon presents for evaluation of left ankle pain after a misstep about a month ago.  Improving but tenderness persists in the middle part of her ankle.  Lower back is doing better.  She also continues to have issues with anxiety and depression.  Continues Norvasc for hypertension and levothyroxine daily for hypothyroidism.  She Claims compliance with both medications.  Past Medical History:  Diagnosis Date  . Anxiety state, unspecified   . Depressive disorder, not elsewhere classified   . Family history of breast cancer 07/05/2011   Pt states she was BRCA neg about 2009  . Fibroid tumor   . Irregular menses 2007  . Irritable bowel syndrome   . Unspecified essential hypertension   . Vitamin D deficiency 06/2011    Past Surgical History:  Procedure Laterality Date  . ABDOMINAL HYSTERECTOMY  08/2008  . DILATION AND CURETTAGE OF UTERUS    . WISDOM TOOTH EXTRACTION  1998    Family History  Problem Relation Age of Onset  . Breast cancer Mother 51  . Hypertension Mother   . Diabetes Brother   . Hypertension Brother   . Breast cancer Maternal Grandmother 30  . Diabetes Maternal Grandmother   . Hypertension Maternal Grandmother   . Breast cancer Maternal Aunt 48  . Diabetes Maternal Aunt   . Breast cancer Maternal Aunt 55  . Diabetes Cousin   . Diabetes Cousin   . Hypertension Brother     Social History   Socioeconomic History  . Marital status: Single    Spouse name: Not on file  . Number of children: Not on file  . Years of education: Not on file  . Highest education level: Not on file  Occupational History  . Occupation: Building control surveyor: UNEMPLOYED  Tobacco Use  .  Smoking status: Former Smoker    Types: Cigarettes    Quit date: 01/23/1990    Years since quitting: 29.6  . Smokeless tobacco: Never Used  Vaping Use  . Vaping Use: Never used  Substance and Sexual Activity  . Alcohol use: Yes    Comment: occ  . Drug use: No  . Sexual activity: Not Currently    Birth control/protection: Surgical  Other Topics Concern  . Not on file  Social History Narrative   Regular exercise: walks a lot at work   Caffeine use: yes   Social Determinants of Health   Financial Resource Strain:   . Difficulty of Paying Living Expenses:   Food Insecurity:   . Worried About Charity fundraiser in the Last Year:   . Arboriculturist in the Last Year:   Transportation Needs:   . Film/video editor (Medical):   Marland Kitchen Lack of Transportation (Non-Medical):   Physical Activity:   . Days of Exercise per Week:   . Minutes of Exercise per Session:   Stress:   . Feeling of Stress :   Social Connections:   . Frequency of Communication with Friends and Family:   . Frequency of Social Gatherings with Friends and Family:   . Attends Religious Services:   . Active Member  of Clubs or Organizations:   . Attends Archivist Meetings:   Marland Kitchen Marital Status:   Intimate Partner Violence:   . Fear of Current or Ex-Partner:   . Emotionally Abused:   Marland Kitchen Physically Abused:   . Sexually Abused:     Outpatient Medications Prior to Visit  Medication Sig Dispense Refill  . amLODipine (NORVASC) 5 MG tablet Take 1 tablet (5 mg total) by mouth 2 (two) times daily. (plz sched with new provider) 180 tablet 0  . cholecalciferol (VITAMIN D) 1000 UNITS tablet Take 1,000 Units by mouth daily.    . clonazePAM (KLONOPIN) 0.5 MG tablet Take 1 tablet (0.5 mg total) by mouth daily as needed. 30 tablet 1  . cyclobenzaprine (FLEXERIL) 5 MG tablet Take 1 tablet (5 mg total) by mouth at bedtime as needed for muscle spasms. 30 tablet 0  . ibuprofen (ADVIL,MOTRIN) 200 MG tablet Take 200 mg by  mouth every 6 (six) hours as needed. Take 1-2 tabs tid prn    . levothyroxine (SYNTHROID) 75 MCG tablet TAKE 1 TABLET(75 MCG) BY MOUTH DAILY 90 tablet 2  . Multiple Vitamin (MULTIVITAMIN) tablet Take 1 tablet by mouth daily.    . traZODone (DESYREL) 50 MG tablet TAKE 1/2 TO 1 (ONE-HALF TO ONE) TABLET BY MOUTH AT BEDTIME AS NEEDED FOR SLEEP 90 tablet 0  . naproxen (NAPROSYN) 500 MG tablet Take 1 tablet (500 mg total) by mouth 2 (two) times daily with a meal. 30 tablet 1  . gabapentin (NEURONTIN) 300 MG capsule Take 100 mg by mouth 2 (two) times daily.  (Patient not taking: Reported on 09/15/2019)    . HYDROcodone-acetaminophen (NORCO) 10-325 MG tablet 1q8h prn (#60 per 30d) (Patient not taking: Reported on 05/18/2019) 60 tablet 0  . ketoconazole (NIZORAL) 2 % cream Apply 1 application topically 2 (two) times daily. (Patient not taking: Reported on 09/15/2019) 30 g 1  . traMADol (ULTRAM) 50 MG tablet 1q8h prn (#60 per 30d) (Patient not taking: Reported on 09/15/2019) 60 tablet 0   No facility-administered medications prior to visit.    Allergies  Allergen Reactions  . Lamotrigine Other (See Comments)    tremors   . Codeine Rash and Itching    ROS Review of Systems  Constitutional: Negative.   HENT: Negative.   Respiratory: Negative.   Cardiovascular: Negative.   Genitourinary: Negative.   Musculoskeletal: Positive for arthralgias. Negative for back pain.  Allergic/Immunologic: Negative for immunocompromised state.  Neurological: Negative for weakness and numbness.  Hematological: Does not bruise/bleed easily.  Psychiatric/Behavioral: Positive for dysphoric mood. Negative for self-injury. The patient is nervous/anxious.    Depression screen [facility] 2/9 09/15/2019 03/06/2019 07/28/2018  Decreased Interest 1 0 0  Down, Depressed, Hopeless 1 0 0  PHQ - 2 Score 2 0 0  Altered sleeping 3 1 -  Tired, decreased energy 2 3 -  Change in appetite 3 0 -  Feeling bad or failure about yourself  0 0 -   Trouble concentrating 1 3 -  Moving slowly or fidgety/restless 1 0 -  Suicidal thoughts 0 0 -  PHQ-9 Score 12 7 -  Difficult doing work/chores Not difficult at all Not difficult at all -      Objective:    Physical Exam Vitals and nursing note reviewed.  Constitutional:      General: She is not in acute distress.    Appearance: Normal appearance. She is not ill-appearing or toxic-appearing.  HENT:     Head: Normocephalic and  atraumatic.     Right Ear: External ear normal.     Left Ear: External ear normal.  Eyes:     General: No scleral icterus.       Right eye: No discharge.        Left eye: No discharge.     Conjunctiva/sclera: Conjunctivae normal.  Cardiovascular:     Rate and Rhythm: Normal rate and regular rhythm.  Pulmonary:     Effort: Pulmonary effort is normal.     Breath sounds: Normal breath sounds.  Abdominal:     General: Bowel sounds are normal.  Musculoskeletal:     Left ankle: No swelling, deformity or ecchymosis. Normal range of motion. Anterior drawer test negative.  Neurological:     Mental Status: She is alert.  Psychiatric:        Mood and Affect: Mood normal.        Behavior: Behavior normal.     BP 120/78   Pulse 65   Temp 97.7 F (36.5 C) (Tympanic)   Ht '5\' 1"'  (1.549 m)   Wt 157 lb 12.8 oz (71.6 kg)   SpO2 97%   BMI 29.82 kg/m  Wt Readings from Last 3 Encounters:  09/15/19 157 lb 12.8 oz (71.6 kg)  05/26/19 155 lb (70.3 kg)  05/22/19 155 lb (70.3 kg)     Health Maintenance Due  Topic Date Due  . INFLUENZA VACCINE  09/13/2019    There are no preventive care reminders to display for this patient.  Lab Results  Component Value Date   TSH 2.33 10/02/2018   Lab Results  Component Value Date   WBC 6.1 05/22/2019   HGB 13.2 05/22/2019   HCT 46.0 05/22/2019   MCV 70.3 (L) 05/22/2019   PLT 369 05/22/2019   Lab Results  Component Value Date   NA 139 05/22/2019   K 3.7 05/22/2019   CO2 24 05/22/2019   GLUCOSE 87  05/22/2019   BUN 18 05/22/2019   CREATININE 0.78 05/22/2019   BILITOT 0.8 03/23/2019   ALKPHOS 54 03/23/2019   AST 21 03/23/2019   ALT 21 03/23/2019   PROT 7.1 03/23/2019   ALBUMIN 4.2 03/23/2019   CALCIUM 9.6 05/22/2019   ANIONGAP 14 05/22/2019   GFR 92.80 08/11/2018   Lab Results  Component Value Date   CHOL 133 08/11/2018   Lab Results  Component Value Date   HDL 48.10 08/11/2018   Lab Results  Component Value Date   LDLCALC 72 08/11/2018   Lab Results  Component Value Date   TRIG 66.0 08/11/2018   Lab Results  Component Value Date   CHOLHDL 3 08/11/2018   Lab Results  Component Value Date   HGBA1C 6.1 11/17/2015      Assessment & Plan:   Problem List Items Addressed This Visit      Cardiovascular and Mediastinum   Essential hypertension   Relevant Orders   Basic metabolic panel   CBC     Endocrine   Hypothyroidism - Primary   Relevant Orders   TSH     Other   Left-sided low back pain without sciatica   Anxiety with depression   Relevant Medications   citalopram (CELEXA) 20 MG tablet   Acute left ankle pain   Relevant Orders   DG Ankle Complete Left      Meds ordered this encounter  Medications  . citalopram (CELEXA) 20 MG tablet    Sig: Take 1 tablet (20 mg total) by mouth daily.  Dispense:  30 tablet    Refill:  1    Follow-up: Return in about 1 month (around 10/16/2019).   Believe that depression with anxiety is the major factor here today.  Ankle actually looks good.  We will check an x-ray to rule out avulsion injury.  She agrees to try Celexa.  Libby Maw, MD

## 2019-10-08 DIAGNOSIS — Z87891 Personal history of nicotine dependence: Secondary | ICD-10-CM | POA: Diagnosis not present

## 2019-10-08 DIAGNOSIS — F329 Major depressive disorder, single episode, unspecified: Secondary | ICD-10-CM | POA: Diagnosis not present

## 2019-10-08 DIAGNOSIS — T24391A Burn of third degree of multiple sites of right lower limb, except ankle and foot, initial encounter: Secondary | ICD-10-CM | POA: Diagnosis not present

## 2019-10-08 DIAGNOSIS — I10 Essential (primary) hypertension: Secondary | ICD-10-CM | POA: Diagnosis not present

## 2019-10-08 DIAGNOSIS — D1801 Hemangioma of skin and subcutaneous tissue: Secondary | ICD-10-CM | POA: Diagnosis not present

## 2019-10-08 DIAGNOSIS — T22091S Burn of unspecified degree of multiple sites of right shoulder and upper limb, except wrist and hand, sequela: Secondary | ICD-10-CM | POA: Diagnosis not present

## 2019-10-08 DIAGNOSIS — Z885 Allergy status to narcotic agent status: Secondary | ICD-10-CM | POA: Diagnosis not present

## 2019-10-08 DIAGNOSIS — T24091S Burn of unspecified degree of multiple sites of right lower limb, except ankle and foot, sequela: Secondary | ICD-10-CM | POA: Diagnosis not present

## 2019-10-08 DIAGNOSIS — T22092S Burn of unspecified degree of multiple sites of left shoulder and upper limb, except wrist and hand, sequela: Secondary | ICD-10-CM | POA: Diagnosis not present

## 2019-10-08 DIAGNOSIS — K529 Noninfective gastroenteritis and colitis, unspecified: Secondary | ICD-10-CM | POA: Diagnosis not present

## 2019-10-08 DIAGNOSIS — Z79899 Other long term (current) drug therapy: Secondary | ICD-10-CM | POA: Diagnosis not present

## 2019-10-08 DIAGNOSIS — L910 Hypertrophic scar: Secondary | ICD-10-CM | POA: Diagnosis not present

## 2019-10-08 DIAGNOSIS — F419 Anxiety disorder, unspecified: Secondary | ICD-10-CM | POA: Diagnosis not present

## 2019-10-08 DIAGNOSIS — L905 Scar conditions and fibrosis of skin: Secondary | ICD-10-CM | POA: Diagnosis not present

## 2019-10-08 DIAGNOSIS — E039 Hypothyroidism, unspecified: Secondary | ICD-10-CM | POA: Diagnosis not present

## 2019-10-24 ENCOUNTER — Other Ambulatory Visit: Payer: Self-pay | Admitting: Family Medicine

## 2019-10-26 ENCOUNTER — Other Ambulatory Visit: Payer: Self-pay

## 2019-10-26 MED ORDER — AMLODIPINE BESYLATE 5 MG PO TABS: 5.0000 mg | ORAL_TABLET | Freq: Two times a day (BID) | ORAL | 0 refills | Status: DC

## 2019-11-24 DIAGNOSIS — I998 Other disorder of circulatory system: Secondary | ICD-10-CM | POA: Diagnosis not present

## 2019-11-24 DIAGNOSIS — X088XXS Exposure to other specified smoke, fire and flames, sequela: Secondary | ICD-10-CM | POA: Diagnosis not present

## 2019-11-24 DIAGNOSIS — T24391S Burn of third degree of multiple sites of right lower limb, except ankle and foot, sequela: Secondary | ICD-10-CM | POA: Diagnosis not present

## 2019-11-24 DIAGNOSIS — T24302S Burn of third degree of unspecified site of left lower limb, except ankle and foot, sequela: Secondary | ICD-10-CM | POA: Diagnosis not present

## 2019-11-24 DIAGNOSIS — T22391S Burn of third degree of multiple sites of right shoulder and upper limb, except wrist and hand, sequela: Secondary | ICD-10-CM | POA: Diagnosis not present

## 2019-11-24 DIAGNOSIS — T22392S Burn of third degree of multiple sites of left shoulder and upper limb, except wrist and hand, sequela: Secondary | ICD-10-CM | POA: Diagnosis not present

## 2019-11-30 ENCOUNTER — Other Ambulatory Visit: Payer: Self-pay | Admitting: Family Medicine

## 2019-11-30 DIAGNOSIS — F339 Major depressive disorder, recurrent, unspecified: Secondary | ICD-10-CM

## 2019-11-30 NOTE — Telephone Encounter (Signed)
Refill request for pending medication last OV 09/15/19. Please advise.

## 2019-11-30 NOTE — Telephone Encounter (Signed)
Patient is calling and requesting a refill for Klonopin and Trazodone sent to [facility] on [address], please advise. CB is [phone]

## 2019-12-08 DIAGNOSIS — Z01818 Encounter for other preprocedural examination: Secondary | ICD-10-CM | POA: Diagnosis not present

## 2019-12-09 ENCOUNTER — Other Ambulatory Visit: Payer: Self-pay | Admitting: Family Medicine

## 2019-12-09 DIAGNOSIS — F339 Major depressive disorder, recurrent, unspecified: Secondary | ICD-10-CM

## 2019-12-09 NOTE — Telephone Encounter (Signed)
Last OV 09/15/19 Last fill 04/08/19  #30/1

## 2019-12-10 DIAGNOSIS — I10 Essential (primary) hypertension: Secondary | ICD-10-CM | POA: Diagnosis not present

## 2019-12-10 DIAGNOSIS — Z79899 Other long term (current) drug therapy: Secondary | ICD-10-CM | POA: Diagnosis not present

## 2019-12-10 DIAGNOSIS — Z87891 Personal history of nicotine dependence: Secondary | ICD-10-CM | POA: Diagnosis not present

## 2019-12-10 DIAGNOSIS — K219 Gastro-esophageal reflux disease without esophagitis: Secondary | ICD-10-CM | POA: Diagnosis not present

## 2019-12-10 DIAGNOSIS — L910 Hypertrophic scar: Secondary | ICD-10-CM | POA: Diagnosis not present

## 2019-12-10 DIAGNOSIS — K589 Irritable bowel syndrome without diarrhea: Secondary | ICD-10-CM | POA: Diagnosis not present

## 2019-12-10 DIAGNOSIS — F419 Anxiety disorder, unspecified: Secondary | ICD-10-CM | POA: Diagnosis not present

## 2019-12-10 DIAGNOSIS — T24039S Burn of unspecified degree of unspecified lower leg, sequela: Secondary | ICD-10-CM | POA: Diagnosis not present

## 2019-12-10 DIAGNOSIS — L905 Scar conditions and fibrosis of skin: Secondary | ICD-10-CM | POA: Diagnosis not present

## 2019-12-10 DIAGNOSIS — E039 Hypothyroidism, unspecified: Secondary | ICD-10-CM | POA: Diagnosis not present

## 2019-12-10 DIAGNOSIS — Z885 Allergy status to narcotic agent status: Secondary | ICD-10-CM | POA: Diagnosis not present

## 2019-12-10 DIAGNOSIS — T2200XS Burn of unspecified degree of shoulder and upper limb, except wrist and hand, unspecified site, sequela: Secondary | ICD-10-CM | POA: Diagnosis not present

## 2019-12-29 DIAGNOSIS — T24391S Burn of third degree of multiple sites of right lower limb, except ankle and foot, sequela: Secondary | ICD-10-CM | POA: Diagnosis not present

## 2019-12-29 DIAGNOSIS — L910 Hypertrophic scar: Secondary | ICD-10-CM | POA: Diagnosis not present

## 2019-12-29 DIAGNOSIS — I998 Other disorder of circulatory system: Secondary | ICD-10-CM | POA: Diagnosis not present

## 2019-12-29 DIAGNOSIS — T22391S Burn of third degree of multiple sites of right shoulder and upper limb, except wrist and hand, sequela: Secondary | ICD-10-CM | POA: Diagnosis not present

## 2019-12-29 DIAGNOSIS — T22392S Burn of third degree of multiple sites of left shoulder and upper limb, except wrist and hand, sequela: Secondary | ICD-10-CM | POA: Diagnosis not present

## 2019-12-31 DIAGNOSIS — L905 Scar conditions and fibrosis of skin: Secondary | ICD-10-CM | POA: Diagnosis not present

## 2019-12-31 DIAGNOSIS — M792 Neuralgia and neuritis, unspecified: Secondary | ICD-10-CM | POA: Diagnosis not present

## 2019-12-31 DIAGNOSIS — T24391S Burn of third degree of multiple sites of right lower limb, except ankle and foot, sequela: Secondary | ICD-10-CM | POA: Diagnosis not present

## 2019-12-31 DIAGNOSIS — M76822 Posterior tibial tendinitis, left leg: Secondary | ICD-10-CM | POA: Diagnosis not present

## 2020-01-25 DIAGNOSIS — Z6827 Body mass index (BMI) 27.0-27.9, adult: Secondary | ICD-10-CM | POA: Diagnosis not present

## 2020-01-25 DIAGNOSIS — L299 Pruritus, unspecified: Secondary | ICD-10-CM | POA: Diagnosis not present

## 2020-01-25 DIAGNOSIS — L910 Hypertrophic scar: Secondary | ICD-10-CM | POA: Diagnosis not present

## 2020-01-25 DIAGNOSIS — T22391S Burn of third degree of multiple sites of right shoulder and upper limb, except wrist and hand, sequela: Secondary | ICD-10-CM | POA: Diagnosis not present

## 2020-04-07 DIAGNOSIS — M76822 Posterior tibial tendinitis, left leg: Secondary | ICD-10-CM | POA: Diagnosis not present

## 2020-04-15 ENCOUNTER — Other Ambulatory Visit: Payer: Self-pay | Admitting: Family Medicine

## 2020-04-15 DIAGNOSIS — M65872 Other synovitis and tenosynovitis, left ankle and foot: Secondary | ICD-10-CM | POA: Diagnosis not present

## 2020-04-15 DIAGNOSIS — M76822 Posterior tibial tendinitis, left leg: Secondary | ICD-10-CM | POA: Diagnosis not present

## 2020-04-15 DIAGNOSIS — M25572 Pain in left ankle and joints of left foot: Secondary | ICD-10-CM | POA: Diagnosis not present

## 2020-04-18 ENCOUNTER — Other Ambulatory Visit: Payer: Self-pay | Admitting: Family Medicine

## 2020-04-18 DIAGNOSIS — F339 Major depressive disorder, recurrent, unspecified: Secondary | ICD-10-CM

## 2020-04-18 NOTE — Telephone Encounter (Signed)
Refill request for pending Rx last office visit 09/15/2019 last refill on Trazodone 08/04/19 refill on Clonazepam 12/10/19. Please advise.

## 2020-04-18 NOTE — Telephone Encounter (Signed)
Pt calling in asking if she can get refill on  1. traZODone (DESYREL) 50 MG tablet 2. clonazePAM (KLONOPIN) 0.5 MG tablet Call back [phone]

## 2020-04-19 MED ORDER — TRAZODONE HCL 50 MG PO TABS: ORAL_TABLET | ORAL | 0 refills | Status: DC

## 2020-04-19 MED ORDER — CLONAZEPAM 0.5 MG PO TABS: ORAL_TABLET | ORAL | 0 refills | Status: DC

## 2020-04-19 NOTE — Telephone Encounter (Signed)
Needs ov, no further refills.

## 2020-04-26 ENCOUNTER — Encounter: Payer: Self-pay | Admitting: Family Medicine

## 2020-04-26 ENCOUNTER — Other Ambulatory Visit: Payer: Self-pay

## 2020-04-26 ENCOUNTER — Ambulatory Visit: Payer: Federal, State, Local not specified - PPO | Admitting: Family Medicine

## 2020-04-26 VITALS — BP 136/88 | HR 66 | Temp 97.7°F | Ht 61.0 in | Wt 156.6 lb

## 2020-04-26 DIAGNOSIS — F418 Other specified anxiety disorders: Secondary | ICD-10-CM

## 2020-04-26 DIAGNOSIS — I10 Essential (primary) hypertension: Secondary | ICD-10-CM

## 2020-04-26 DIAGNOSIS — D509 Iron deficiency anemia, unspecified: Secondary | ICD-10-CM | POA: Diagnosis not present

## 2020-04-26 DIAGNOSIS — E039 Hypothyroidism, unspecified: Secondary | ICD-10-CM

## 2020-04-26 LAB — BASIC METABOLIC PANEL
BUN: 15 mg/dL (ref 6–23)
CO2: 27 mEq/L (ref 19–32)
Calcium: 9.5 mg/dL (ref 8.4–10.5)
Chloride: 103 mEq/L (ref 96–112)
Creatinine, Ser: 0.79 mg/dL (ref 0.40–1.20)
GFR: 83.48 mL/min (ref >60.00)
Glucose, Bld: 86 mg/dL (ref 70–99)
Potassium: 3.8 mEq/L (ref 3.5–5.1)
Sodium: 140 mEq/L (ref 135–145)

## 2020-04-26 LAB — CBC
HCT: 39.6 % (ref 36.0–46.0)
Hemoglobin: 12.7 g/dL (ref 12.0–15.0)
MCHC: 32.1 g/dL (ref 30.0–36.0)
MCV: 64.1 fl — ABNORMAL LOW (ref 78.0–100.0)
Platelets: 336 10*3/uL (ref 150.0–400.0)
RBC: 6.18 Mil/uL — ABNORMAL HIGH (ref 3.87–5.11)
RDW: 17.9 % — ABNORMAL HIGH (ref 11.5–15.5)
WBC: 6.5 10*3/uL (ref 4.0–10.5)

## 2020-04-26 LAB — TSH: TSH: 2.16 u[IU]/mL (ref 0.35–4.50)

## 2020-04-26 MED ORDER — AMLODIPINE BESYLATE 10 MG PO TABS: 10.0000 mg | ORAL_TABLET | Freq: Every day | ORAL | 3 refills | Status: DC

## 2020-04-26 MED ORDER — VENLAFAXINE HCL ER 75 MG PO CP24
75.0000 mg | ORAL_CAPSULE | Freq: Every day | ORAL | 1 refills | Status: DC
Start: 2020-04-26 — End: 2021-03-29

## 2020-04-26 NOTE — Patient Instructions (Signed)
Managing Your Hypertension Hypertension, also called high blood pressure, is when the force of the blood pressing against the walls of the arteries is too strong. Arteries are blood vessels that carry blood from your heart throughout your body. Hypertension forces the heart to work harder to pump blood and may cause the arteries to become narrow or stiff. Understanding blood pressure readings Your personal target blood pressure may vary depending on your medical conditions, your age, and other factors. A blood pressure reading includes a higher number over a lower number. Ideally, your blood pressure should be below 120/80. You should know that:  The first, or top, number is called the systolic pressure. It is a measure of the pressure in your arteries as your heart beats.  The second, or bottom number, is called the diastolic pressure. It is a measure of the pressure in your arteries as the heart relaxes. Blood pressure is classified into four stages. Based on your blood pressure reading, your health care provider may use the following stages to determine what type of treatment you need, if any. Systolic pressure and diastolic pressure are measured in a unit called mmHg. Normal  Systolic pressure: below 120.  Diastolic pressure: below 80. Elevated  Systolic pressure: 120-129.  Diastolic pressure: below 80. Hypertension stage 1  Systolic pressure: 130-139.  Diastolic pressure: 80-89. Hypertension stage 2  Systolic pressure: 140 or above.  Diastolic pressure: 90 or above. How can this condition affect me? Managing your hypertension is an important responsibility. Over time, hypertension can damage the arteries and decrease blood flow to important parts of the body, including the brain, heart, and kidneys. Having untreated or uncontrolled hypertension can lead to:  A heart attack.  A stroke.  A weakened blood vessel (aneurysm).  Heart failure.  Kidney damage.  Eye  damage.  Metabolic syndrome.  Memory and concentration problems.  Vascular dementia. What actions can I take to manage this condition? Hypertension can be managed by making lifestyle changes and possibly by taking medicines. Your health care provider will help you make a plan to bring your blood pressure within a normal range. Nutrition  Eat a diet that is high in fiber and potassium, and low in salt (sodium), added sugar, and fat. An example eating plan is called the Dietary Approaches to Stop Hypertension (DASH) diet. To eat this way: ? Eat plenty of fresh fruits and vegetables. Try to fill one-half of your plate at each meal with fruits and vegetables. ? Eat whole grains, such as whole-wheat pasta, brown rice, or whole-grain bread. Fill about one-fourth of your plate with whole grains. ? Eat low-fat dairy products. ? Avoid fatty cuts of meat, processed or cured meats, and poultry with skin. Fill about one-fourth of your plate with lean proteins such as fish, chicken without skin, beans, eggs, and tofu. ? Avoid pre-made and processed foods. These tend to be higher in sodium, added sugar, and fat.  Reduce your daily sodium intake. Most people with hypertension should eat less than 1,500 mg of sodium a day.   Lifestyle  Work with your health care provider to maintain a healthy body weight or to lose weight. Ask what an ideal weight is for you.  Get at least 30 minutes of exercise that causes your heart to beat faster (aerobic exercise) most days of the week. Activities may include walking, swimming, or biking.  Include exercise to strengthen your muscles (resistance exercise), such as weight lifting, as part of your weekly exercise routine. Try   to do these types of exercises for 30 minutes at least 3 days a week.  Do not use any products that contain nicotine or tobacco, such as cigarettes, e-cigarettes, and chewing tobacco. If you need help quitting, ask your health care  provider.  Control any long-term (chronic) conditions you have, such as high cholesterol or diabetes.  Identify your sources of stress and find ways to manage stress. This may include meditation, deep breathing, or making time for fun activities.   Alcohol use  Do not drink alcohol if: ? Your health care provider tells you not to drink. ? You are pregnant, may be pregnant, or are planning to become pregnant.  If you drink alcohol: ? Limit how much you use to:  0-1 drink a day for women.  0-2 drinks a day for men. ? Be aware of how much alcohol is in your drink. In the [place] one drink equals one 12 oz bottle of beer (355 mL), one 5 oz glass of wine (148 mL), or one 1 oz glass of hard liquor (44 mL). Medicines Your health care provider may prescribe medicine if lifestyle changes are not enough to get your blood pressure under control and if:  Your systolic blood pressure is 130 or higher.  Your diastolic blood pressure is 80 or higher. Take medicines only as told by your health care provider. Follow the directions carefully. Blood pressure medicines must be taken as told by your health care provider. The medicine does not work as well when you skip doses. Skipping doses also puts you at risk for problems. Monitoring Before you monitor your blood pressure:  Do not smoke, drink caffeinated beverages, or exercise within 30 minutes before taking a measurement.  Use the bathroom and empty your bladder (urinate).  Sit quietly for at least 5 minutes before taking measurements. Monitor your blood pressure at home as told by your health care provider. To do this:  Sit with your back straight and supported.  Place your feet flat on the floor. Do not cross your legs.  Support your arm on a flat surface, such as a table. Make sure your upper arm is at heart level.  Each time you measure, take two or three readings one minute apart and record the results. You may also need to have your  blood pressure checked regularly by your health care provider.   General information  Talk with your health care provider about your diet, exercise habits, and other lifestyle factors that may be contributing to hypertension.  Review all the medicines you take with your health care provider because there may be side effects or interactions.  Keep all visits as told by your health care provider. Your health care provider can help you create and adjust your plan for managing your high blood pressure. Where to find more information  National Heart, Lung, and Blood Institute: www.nhlbi.nih.gov  American Heart Association: www.heart.org Contact a health care provider if:  You think you are having a reaction to medicines you have taken.  You have repeated (recurrent) headaches.  You feel dizzy.  You have swelling in your ankles.  You have trouble with your vision. Get help right away if:  You develop a severe headache or confusion.  You have unusual weakness or numbness, or you feel faint.  You have severe pain in your chest or abdomen.  You vomit repeatedly.  You have trouble breathing. These symptoms may represent a serious problem that is an emergency. Do not wait   to see if the symptoms will go away. Get medical help right away. Call your local emergency services (911 in the [place] Do not drive yourself to the hospital. Summary  Hypertension is when the force of blood pumping through your arteries is too strong. If this condition is not controlled, it may put you at risk for serious complications.  Your personal target blood pressure may vary depending on your medical conditions, your age, and other factors. For most people, a normal blood pressure is less than 120/80.  Hypertension is managed by lifestyle changes, medicines, or both.  Lifestyle changes to help manage hypertension include losing weight, eating a healthy, low-sodium diet, exercising more, stopping smoking, and  limiting alcohol. This information is not intended to replace advice given to you by your health care provider. Make sure you discuss any questions you have with your health care provider. Document Revised: 03/06/2019 Document Reviewed: 12/30/2018 Elsevier Patient Education  2021 [employer]  
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
## 2020-04-27 LAB — IRON,TIBC AND FERRITIN PANEL
%SAT: 23 % (calc) (ref 16–45)
Ferritin: 52 ng/mL (ref 16–232)
Iron: 78 ug/dL (ref 45–160)
TIBC: 341 mcg/dL (calc) (ref 250–450)

## 2020-04-30 ENCOUNTER — Other Ambulatory Visit: Payer: Self-pay | Admitting: Family Medicine

## 2020-04-30 DIAGNOSIS — E039 Hypothyroidism, unspecified: Secondary | ICD-10-CM

## 2020-05-09 DIAGNOSIS — M21079 Valgus deformity, not elsewhere classified, unspecified ankle: Secondary | ICD-10-CM | POA: Diagnosis not present

## 2020-05-09 DIAGNOSIS — M76822 Posterior tibial tendinitis, left leg: Secondary | ICD-10-CM | POA: Diagnosis not present

## 2020-05-09 DIAGNOSIS — M216X2 Other acquired deformities of left foot: Secondary | ICD-10-CM | POA: Diagnosis not present

## 2020-05-10 IMAGING — DX DG LUMBAR SPINE COMPLETE 4+V
5 series · 5 of 5 positions shown · non-contrast
Comparison: CT abdomen and pelvis 11/01/2010

CLINICAL DATA: Lumbago, chronic LEFT-sided low back pain without
sciatica

EXAM:
LUMBAR SPINE - COMPLETE 4+ VIEW

[lumbar spine ap]
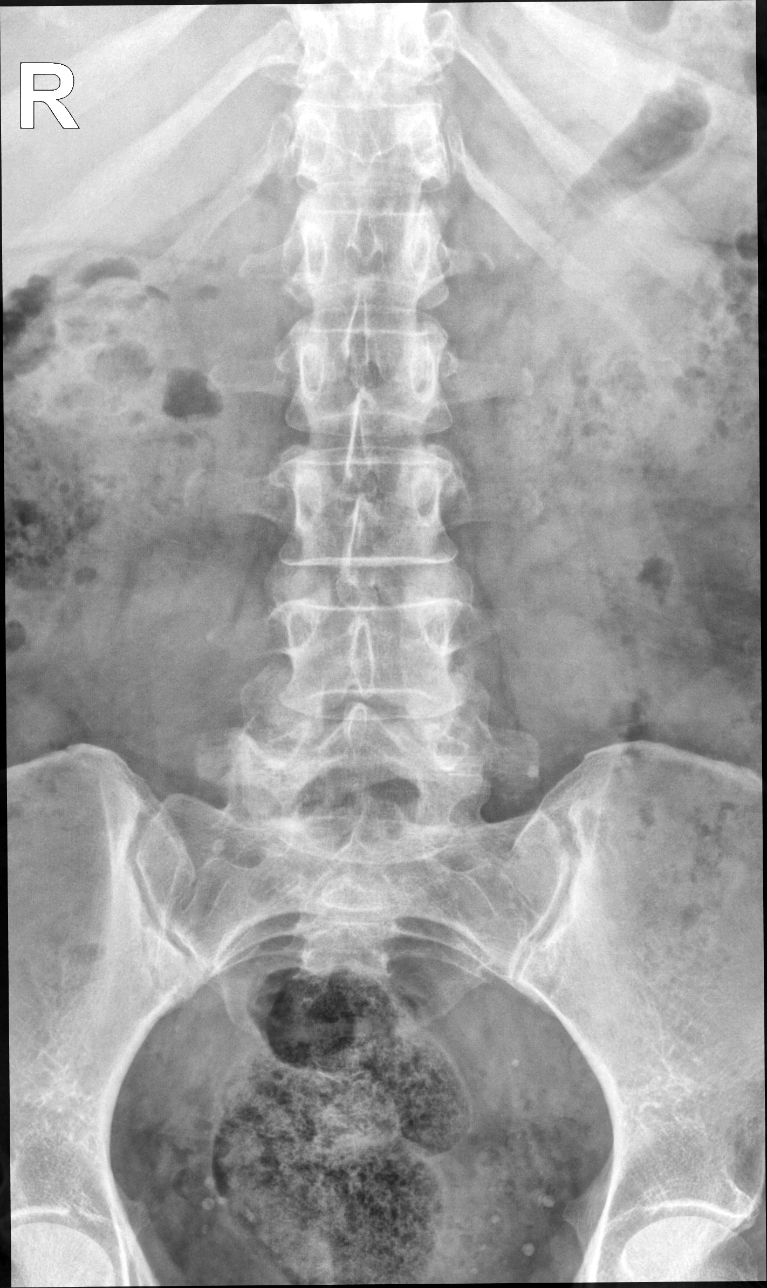

[lumbar spine lmo (1 of 2)]
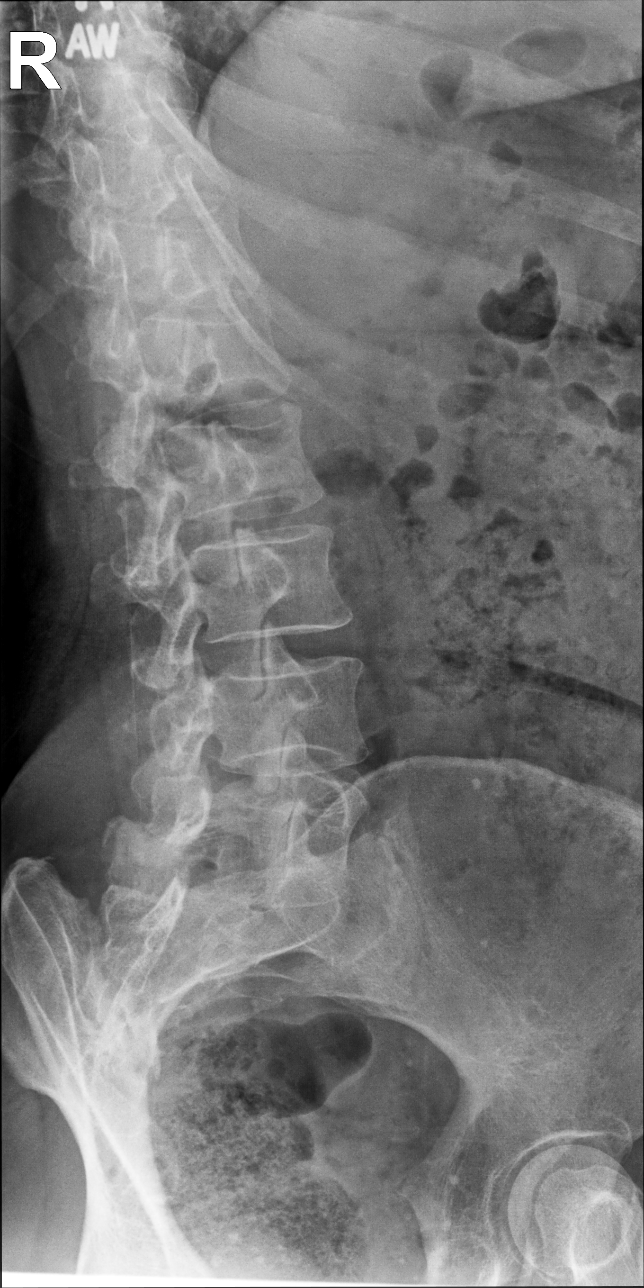

[lumbar spine lmo (2 of 2)]
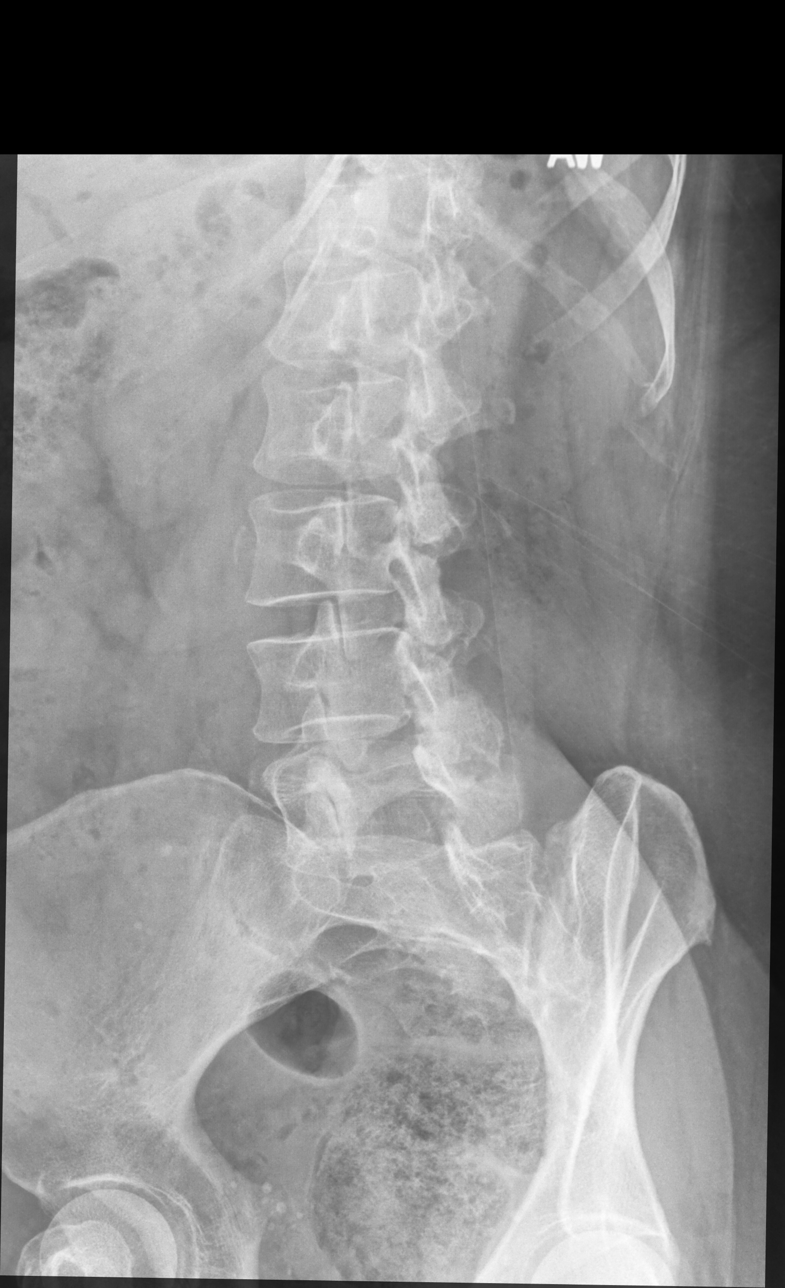

[lumbar spine lat (1 of 2)]
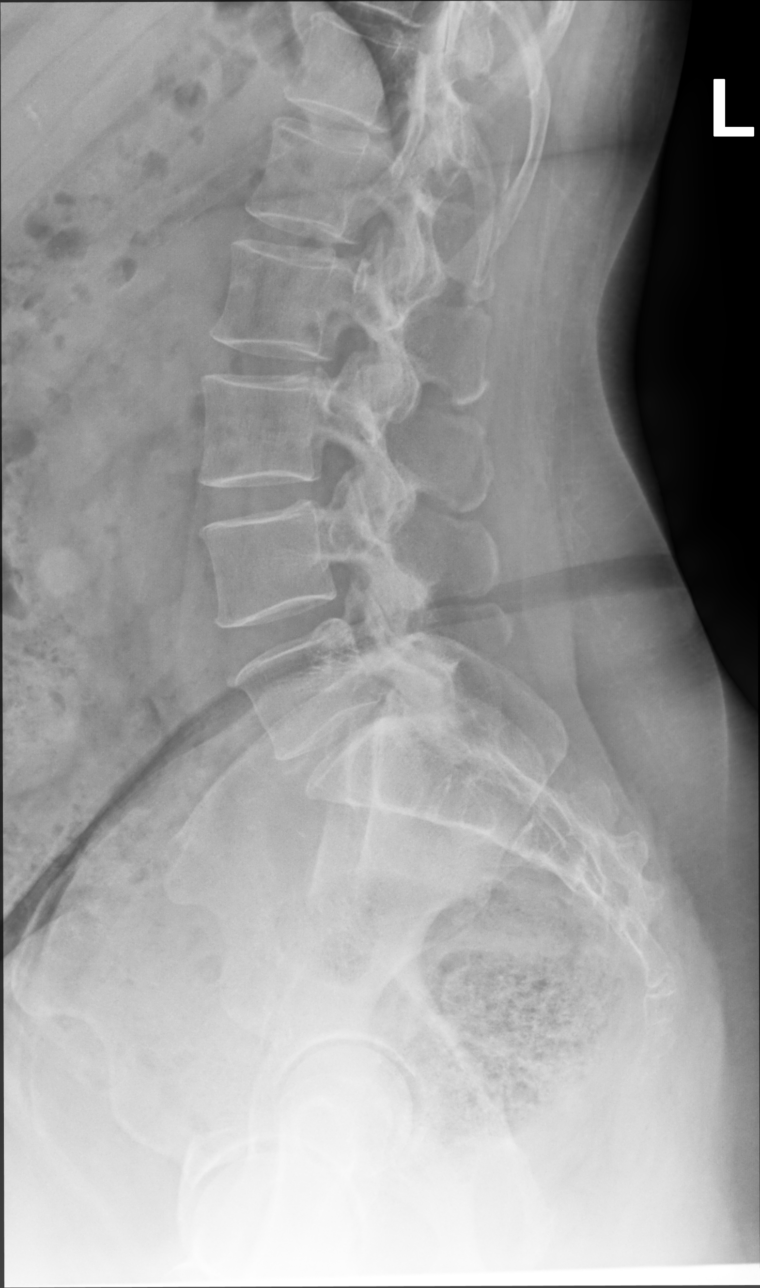

[lumbar spine lat (2 of 2)]
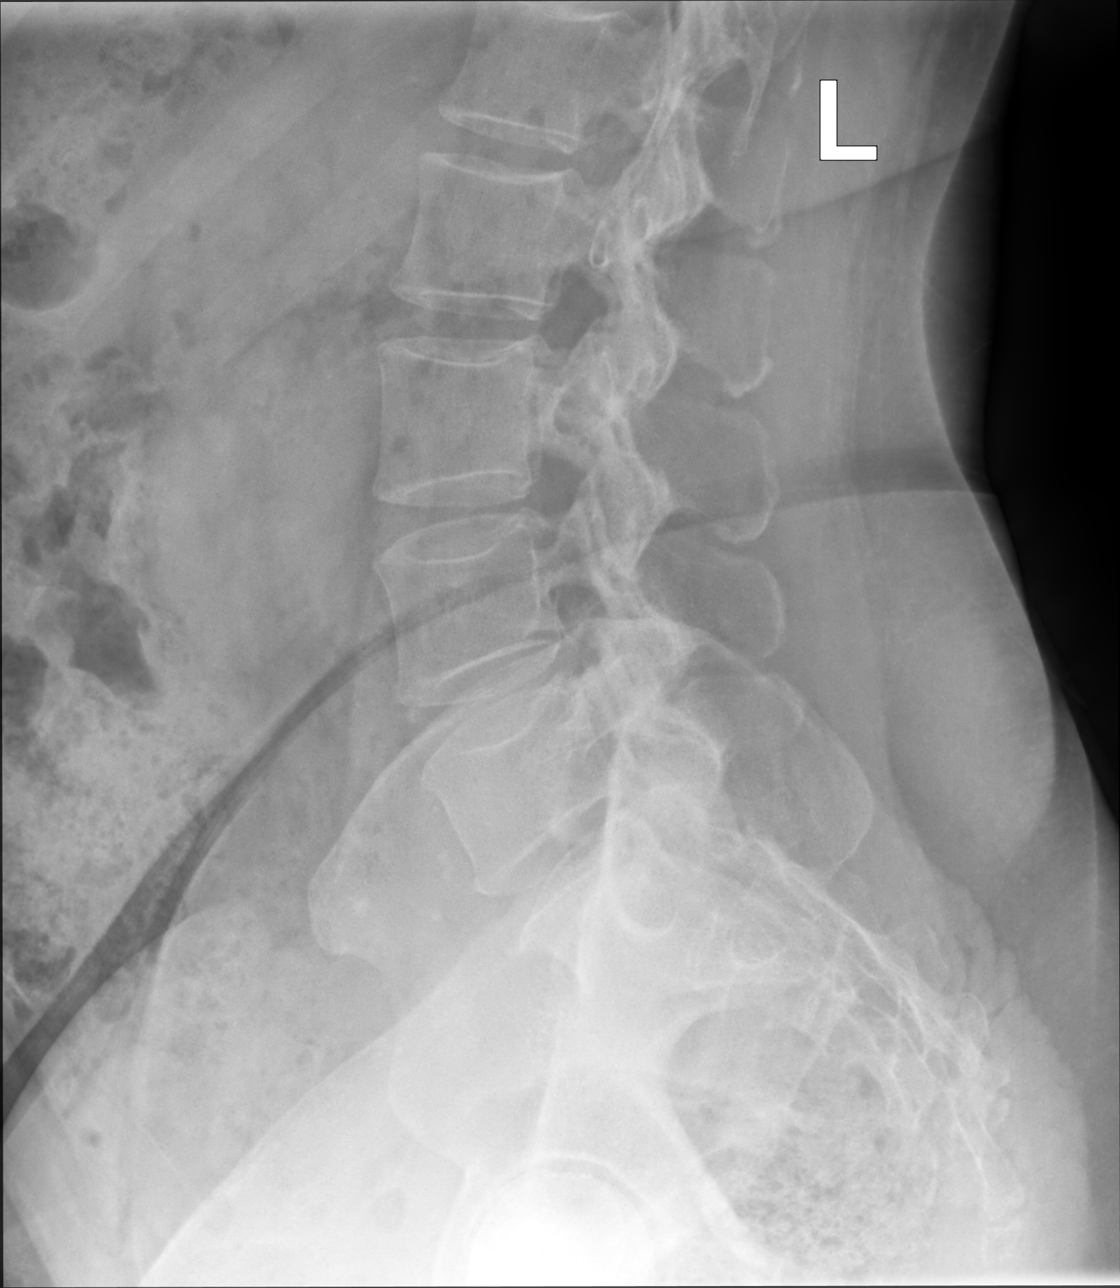

[5 of 5 positions shown; findings below may reference images not displayed]

FINDINGS: Osseous demineralization.

Five non-rib-bearing lumbar vertebra.

Mild facet degenerative changes at L4-L5 and L5-S1 bilaterally.

Vertebral body and disc space heights maintained without fracture or
subluxation.

No gross spondylolysis.

SI joints preserved.
IMPRESSION: Mild facet degenerative changes lower lumbar spine.

No acute abnormalities.

## 2020-05-15 IMAGING — CR DG CHEST 2V
2 series · 2 of 2 positions shown · non-contrast
Comparison: June 27, 2018

CLINICAL DATA: Chest pain

EXAM:
CHEST - 2 VIEW

[chest pa]
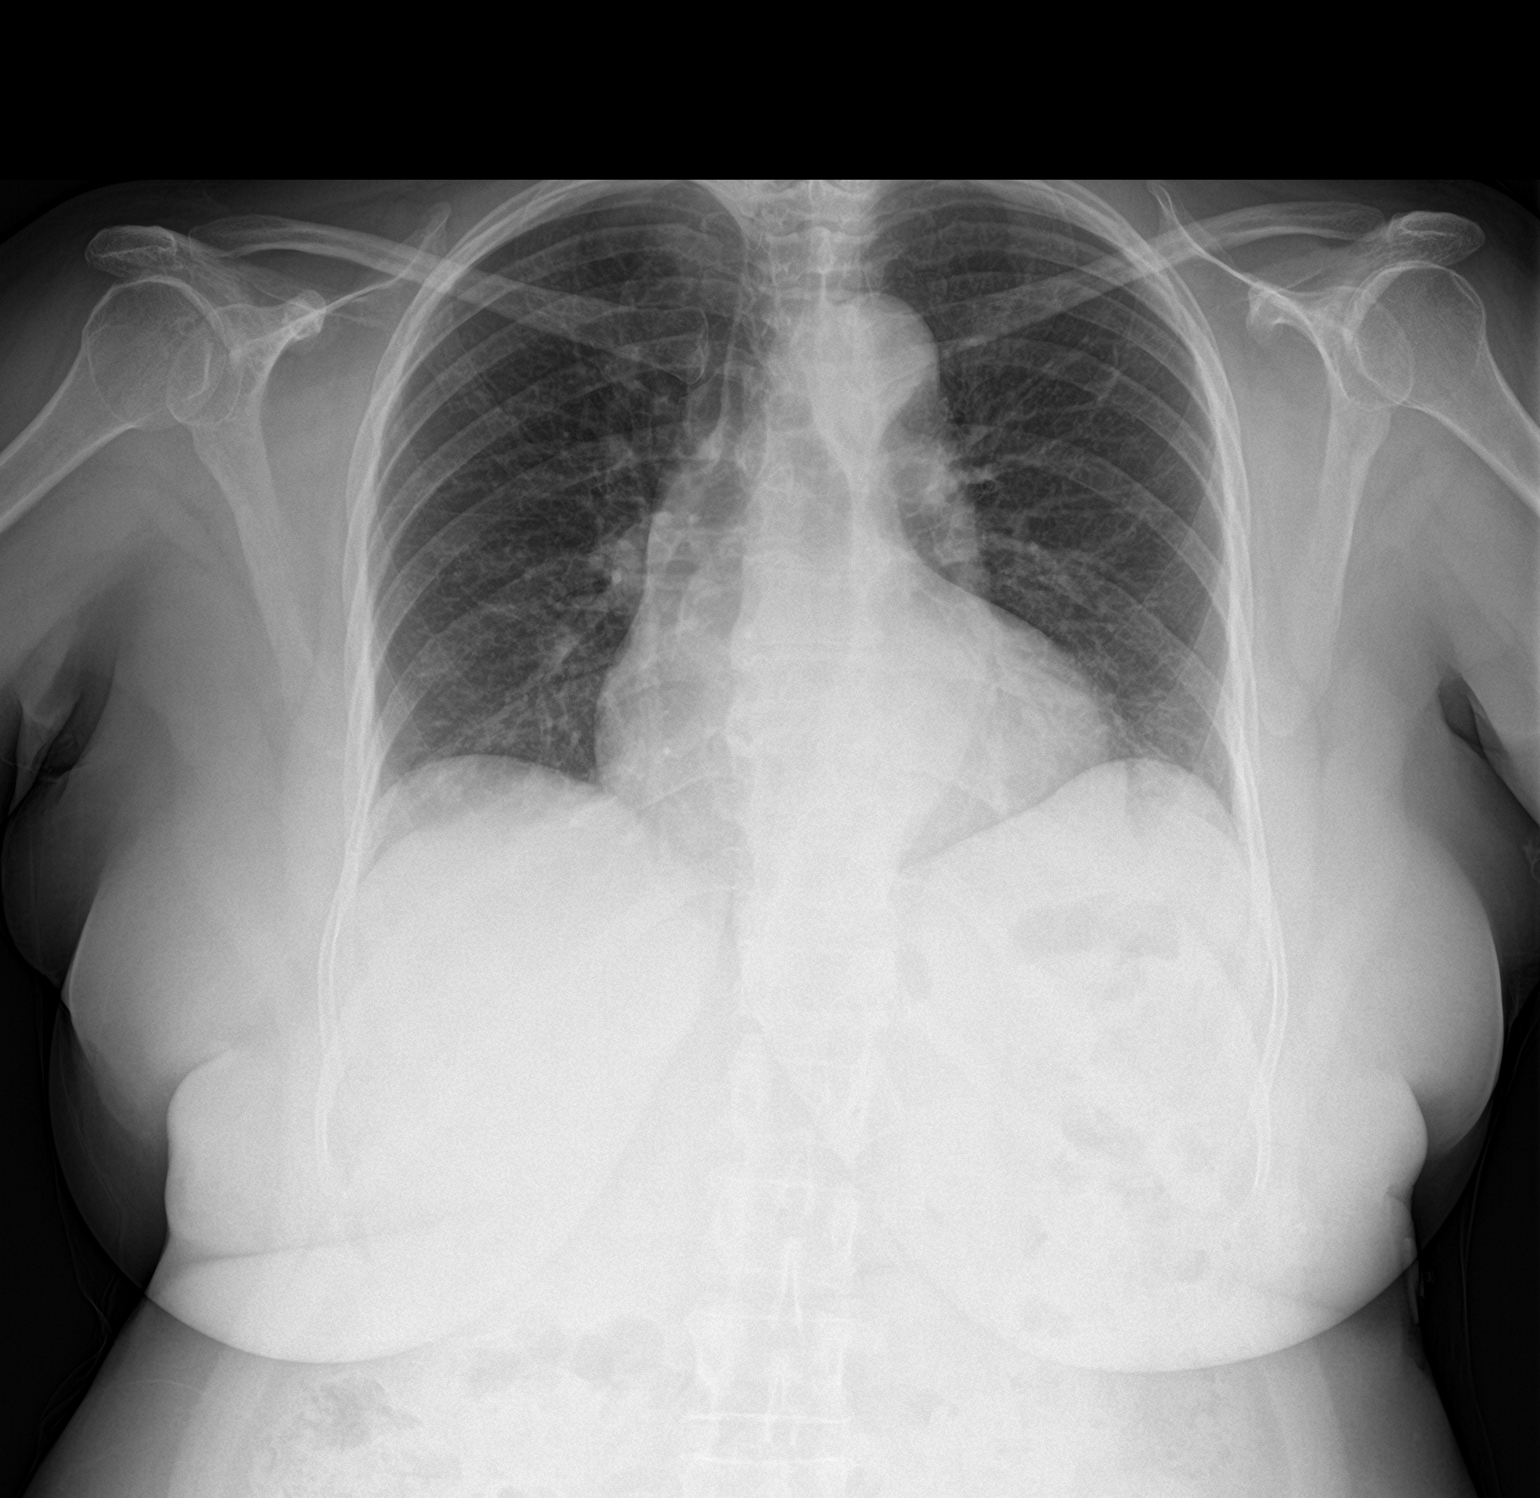

[chest lat]
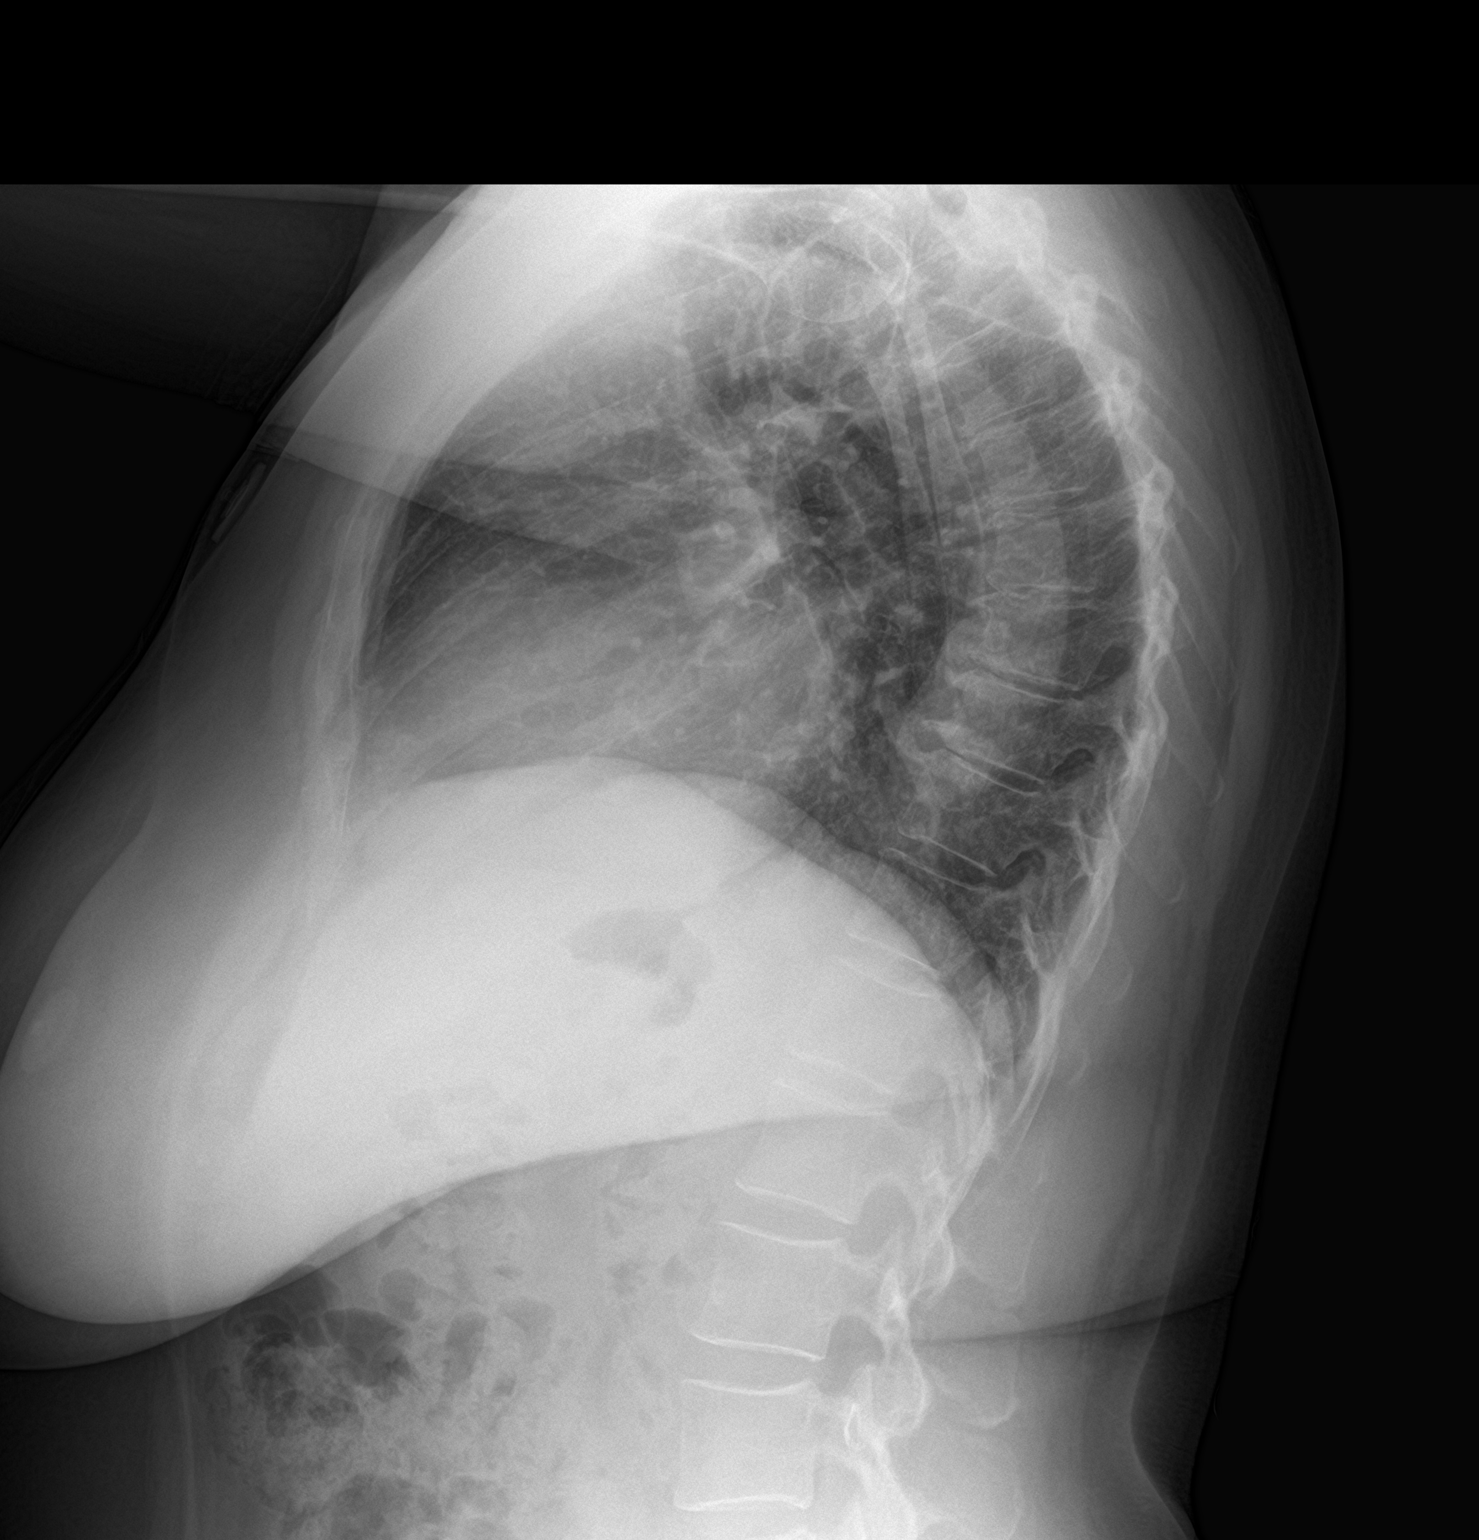

[2 of 2 positions shown; findings below may reference images not displayed]

FINDINGS: The lungs are clear. The heart size and pulmonary vascularity are
normal. No adenopathy. There is degenerative change in the
midthoracic spine.
IMPRESSION: Lungs clear.  Cardiac silhouette within normal limits.

## 2020-05-31 DIAGNOSIS — Z01818 Encounter for other preprocedural examination: Secondary | ICD-10-CM | POA: Diagnosis not present

## 2020-06-03 DIAGNOSIS — E039 Hypothyroidism, unspecified: Secondary | ICD-10-CM | POA: Diagnosis not present

## 2020-06-03 DIAGNOSIS — K589 Irritable bowel syndrome without diarrhea: Secondary | ICD-10-CM | POA: Diagnosis not present

## 2020-06-03 DIAGNOSIS — F32A Depression, unspecified: Secondary | ICD-10-CM | POA: Diagnosis not present

## 2020-06-03 DIAGNOSIS — T24031S Burn of unspecified degree of right lower leg, sequela: Secondary | ICD-10-CM | POA: Diagnosis not present

## 2020-06-03 DIAGNOSIS — R203 Hyperesthesia: Secondary | ICD-10-CM | POA: Diagnosis not present

## 2020-06-03 DIAGNOSIS — G43909 Migraine, unspecified, not intractable, without status migrainosus: Secondary | ICD-10-CM | POA: Diagnosis not present

## 2020-06-03 DIAGNOSIS — K219 Gastro-esophageal reflux disease without esophagitis: Secondary | ICD-10-CM | POA: Diagnosis not present

## 2020-06-03 DIAGNOSIS — I10 Essential (primary) hypertension: Secondary | ICD-10-CM | POA: Diagnosis not present

## 2020-06-03 DIAGNOSIS — T22391S Burn of third degree of multiple sites of right shoulder and upper limb, except wrist and hand, sequela: Secondary | ICD-10-CM | POA: Diagnosis not present

## 2020-06-03 DIAGNOSIS — Z90710 Acquired absence of both cervix and uterus: Secondary | ICD-10-CM | POA: Diagnosis not present

## 2020-06-03 DIAGNOSIS — T3111 Burns involving 10-19% of body surface with 10-19% third degree burns: Secondary | ICD-10-CM | POA: Diagnosis not present

## 2020-06-03 DIAGNOSIS — L910 Hypertrophic scar: Secondary | ICD-10-CM | POA: Diagnosis not present

## 2020-06-03 DIAGNOSIS — F419 Anxiety disorder, unspecified: Secondary | ICD-10-CM | POA: Diagnosis not present

## 2020-06-03 DIAGNOSIS — Z885 Allergy status to narcotic agent status: Secondary | ICD-10-CM | POA: Diagnosis not present

## 2020-06-03 DIAGNOSIS — X102XXS Contact with fats and cooking oils, sequela: Secondary | ICD-10-CM | POA: Diagnosis not present

## 2020-06-03 DIAGNOSIS — Z87891 Personal history of nicotine dependence: Secondary | ICD-10-CM | POA: Diagnosis not present

## 2020-06-03 DIAGNOSIS — Z888 Allergy status to other drugs, medicaments and biological substances status: Secondary | ICD-10-CM | POA: Diagnosis not present

## 2020-06-03 DIAGNOSIS — Z79899 Other long term (current) drug therapy: Secondary | ICD-10-CM | POA: Diagnosis not present

## 2020-06-03 DIAGNOSIS — I998 Other disorder of circulatory system: Secondary | ICD-10-CM | POA: Diagnosis not present

## 2020-06-04 DIAGNOSIS — T22391S Burn of third degree of multiple sites of right shoulder and upper limb, except wrist and hand, sequela: Secondary | ICD-10-CM | POA: Diagnosis not present

## 2020-06-04 DIAGNOSIS — Z888 Allergy status to other drugs, medicaments and biological substances status: Secondary | ICD-10-CM | POA: Diagnosis not present

## 2020-06-04 DIAGNOSIS — I998 Other disorder of circulatory system: Secondary | ICD-10-CM | POA: Diagnosis not present

## 2020-06-04 DIAGNOSIS — I10 Essential (primary) hypertension: Secondary | ICD-10-CM | POA: Diagnosis not present

## 2020-06-04 DIAGNOSIS — X102XXS Contact with fats and cooking oils, sequela: Secondary | ICD-10-CM | POA: Diagnosis not present

## 2020-06-04 DIAGNOSIS — Z79899 Other long term (current) drug therapy: Secondary | ICD-10-CM | POA: Diagnosis not present

## 2020-06-04 DIAGNOSIS — F419 Anxiety disorder, unspecified: Secondary | ICD-10-CM | POA: Diagnosis not present

## 2020-06-04 DIAGNOSIS — Z87891 Personal history of nicotine dependence: Secondary | ICD-10-CM | POA: Diagnosis not present

## 2020-06-04 DIAGNOSIS — Z885 Allergy status to narcotic agent status: Secondary | ICD-10-CM | POA: Diagnosis not present

## 2020-06-04 DIAGNOSIS — Z90710 Acquired absence of both cervix and uterus: Secondary | ICD-10-CM | POA: Diagnosis not present

## 2020-06-04 DIAGNOSIS — L910 Hypertrophic scar: Secondary | ICD-10-CM | POA: Diagnosis not present

## 2020-06-04 DIAGNOSIS — F32A Depression, unspecified: Secondary | ICD-10-CM | POA: Diagnosis not present

## 2020-06-04 DIAGNOSIS — E039 Hypothyroidism, unspecified: Secondary | ICD-10-CM | POA: Diagnosis not present

## 2020-06-04 DIAGNOSIS — G43909 Migraine, unspecified, not intractable, without status migrainosus: Secondary | ICD-10-CM | POA: Diagnosis not present

## 2020-06-04 DIAGNOSIS — K589 Irritable bowel syndrome without diarrhea: Secondary | ICD-10-CM | POA: Diagnosis not present

## 2020-06-04 DIAGNOSIS — T3111 Burns involving 10-19% of body surface with 10-19% third degree burns: Secondary | ICD-10-CM | POA: Diagnosis not present

## 2020-06-04 DIAGNOSIS — K219 Gastro-esophageal reflux disease without esophagitis: Secondary | ICD-10-CM | POA: Diagnosis not present

## 2020-06-28 ENCOUNTER — Ambulatory Visit: Payer: Federal, State, Local not specified - PPO | Admitting: Family Medicine

## 2020-08-02 DIAGNOSIS — T24302S Burn of third degree of unspecified site of left lower limb, except ankle and foot, sequela: Secondary | ICD-10-CM | POA: Diagnosis not present

## 2020-08-02 DIAGNOSIS — X58XXXS Exposure to other specified factors, sequela: Secondary | ICD-10-CM | POA: Diagnosis not present

## 2020-08-02 DIAGNOSIS — I998 Other disorder of circulatory system: Secondary | ICD-10-CM | POA: Diagnosis not present

## 2020-08-02 DIAGNOSIS — L910 Hypertrophic scar: Secondary | ICD-10-CM | POA: Diagnosis not present

## 2020-08-02 DIAGNOSIS — T24391S Burn of third degree of multiple sites of right lower limb, except ankle and foot, sequela: Secondary | ICD-10-CM | POA: Diagnosis not present

## 2020-08-02 DIAGNOSIS — T22391S Burn of third degree of multiple sites of right shoulder and upper limb, except wrist and hand, sequela: Secondary | ICD-10-CM | POA: Diagnosis not present

## 2020-09-02 DIAGNOSIS — Z87891 Personal history of nicotine dependence: Secondary | ICD-10-CM | POA: Diagnosis not present

## 2020-09-02 DIAGNOSIS — K219 Gastro-esophageal reflux disease without esophagitis: Secondary | ICD-10-CM | POA: Diagnosis not present

## 2020-09-02 DIAGNOSIS — M76822 Posterior tibial tendinitis, left leg: Secondary | ICD-10-CM | POA: Diagnosis not present

## 2020-09-02 DIAGNOSIS — Z79890 Hormone replacement therapy (postmenopausal): Secondary | ICD-10-CM | POA: Diagnosis not present

## 2020-09-02 DIAGNOSIS — M79672 Pain in left foot: Secondary | ICD-10-CM | POA: Diagnosis not present

## 2020-09-02 DIAGNOSIS — I10 Essential (primary) hypertension: Secondary | ICD-10-CM | POA: Diagnosis not present

## 2020-09-02 DIAGNOSIS — M216X2 Other acquired deformities of left foot: Secondary | ICD-10-CM | POA: Diagnosis not present

## 2020-09-02 DIAGNOSIS — E039 Hypothyroidism, unspecified: Secondary | ICD-10-CM | POA: Diagnosis not present

## 2020-09-02 DIAGNOSIS — K589 Irritable bowel syndrome without diarrhea: Secondary | ICD-10-CM | POA: Diagnosis not present

## 2020-09-02 DIAGNOSIS — Z79899 Other long term (current) drug therapy: Secondary | ICD-10-CM | POA: Diagnosis not present

## 2020-09-02 DIAGNOSIS — Z885 Allergy status to narcotic agent status: Secondary | ICD-10-CM | POA: Diagnosis not present

## 2020-09-09 ENCOUNTER — Encounter: Payer: Self-pay | Admitting: Family Medicine

## 2020-09-09 ENCOUNTER — Telehealth (INDEPENDENT_AMBULATORY_CARE_PROVIDER_SITE_OTHER): Payer: Federal, State, Local not specified - PPO | Admitting: Family Medicine

## 2020-09-09 VITALS — Ht 61.0 in

## 2020-09-09 DIAGNOSIS — F418 Other specified anxiety disorders: Secondary | ICD-10-CM | POA: Diagnosis not present

## 2020-09-09 NOTE — Progress Notes (Signed)
Established Patient Office Visit  Subjective:  Patient ID: Kaitlyn Solomon, female    DOB: [birth year]  Age: 55 y.o. MRN: [id number]  CC:  Chief Complaint  Patient presents with   Medication Refill    Patient states that she have had anxiety attacks more frequent during the day and she's not able to sleep.     HPI Kaitlyn Solomon presents for follow-up of anxiety and depression.  She is status post surgery on her left gastrocnemius tendon a week ago.  She has not had her post surgery follow-up as of yet.  She tells me that the doctor has her out to the end of October.  She said that she is not sleeping well at night.  There was no follow-up with psychology because she said that no one ever called her after and made the referral.  She discontinued the medicines that I gave her at her last clinic visit because she said they made her feel bad.  When I suggested that perhaps we could try a different medicine and that maybe she should see a psychiatrist the call ended abruptly.  When I tried to reconnect there was no response.  Past Medical History:  Diagnosis Date   Anxiety state, unspecified    Depressive disorder, not elsewhere classified    Family history of breast cancer 07/05/2011   Pt states she was BRCA neg about 2009   Fibroid tumor    Irregular menses 2007   Irritable bowel syndrome    Unspecified essential hypertension    Vitamin D deficiency 06/2011    Past Surgical History:  Procedure Laterality Date   ABDOMINAL HYSTERECTOMY  08/2008   DILATION AND CURETTAGE OF UTERUS     WISDOM TOOTH EXTRACTION  1998    Family History  Problem Relation Age of Onset   Breast cancer Mother 52   Hypertension Mother    Diabetes Brother    Hypertension Brother    Breast cancer Maternal Grandmother 21   Diabetes Maternal Grandmother    Hypertension Maternal Grandmother    Breast cancer Maternal Aunt 48   Diabetes Maternal Aunt    Breast cancer Maternal Aunt 50   Diabetes Cousin     Diabetes Cousin    Hypertension Brother     Social History   Socioeconomic History   Marital status: Single    Spouse name: Not on file   Number of children: Not on file   Years of education: Not on file   Highest education level: Not on file  Occupational History   Occupation: Building control surveyor: UNEMPLOYED  Tobacco Use   Smoking status: Former    Types: Cigarettes    Quit date: 01/23/1990    Years since quitting: 30.6   Smokeless tobacco: Never  Vaping Use   Vaping Use: Never used  Substance and Sexual Activity   Alcohol use: Yes    Comment: occ   Drug use: No   Sexual activity: Not Currently    Birth control/protection: Surgical  Other Topics Concern   Not on file  Social History Narrative   Regular exercise: walks a lot at work   Caffeine use: yes   Social Determinants of Health   Financial Resource Strain: Not on file  Food Insecurity: Not on file  Transportation Needs: Not on file  Physical Activity: Not on file  Stress: Not on file  Social Connections: Not on file  Intimate Partner Violence: Not on file  Outpatient Medications Prior to Visit  Medication Sig Dispense Refill   amLODipine (NORVASC) 10 MG tablet Take 1 tablet (10 mg total) by mouth daily. 90 tablet 3   cholecalciferol (VITAMIN D) 1000 UNITS tablet Take 1,000 Units by mouth daily.     cyclobenzaprine (FLEXERIL) 5 MG tablet Take 1 tablet (5 mg total) by mouth at bedtime as needed for muscle spasms. 30 tablet 0   ibuprofen (ADVIL,MOTRIN) 200 MG tablet Take 200 mg by mouth every 6 (six) hours as needed. Take 1-2 tabs tid prn     levothyroxine (SYNTHROID) 75 MCG tablet TAKE 1 TABLET(75 MCG) BY MOUTH DAILY 90 tablet 2   Multiple Vitamin (MULTIVITAMIN) tablet Take 1 tablet by mouth daily.     clonazePAM (KLONOPIN) 0.5 MG tablet TAKE 1 TABLET(0.5 MG) BY MOUTH DAILY AS NEEDED 30 tablet 0   venlafaxine XR (EFFEXOR XR) 75 MG 24 hr capsule Take 1 capsule (75 mg total) by mouth daily with  breakfast. (Patient not taking: Reported on 09/09/2020) 90 capsule 1   gabapentin (NEURONTIN) 300 MG capsule Take 100 mg by mouth 2 (two) times daily.  (Patient not taking: No sig reported)     No facility-administered medications prior to visit.    Allergies  Allergen Reactions   Lamotrigine Other (See Comments)    tremors    Codeine Rash and Itching    ROS Review of Systems  Constitutional:  Negative for diaphoresis, fatigue, fever and unexpected weight change.  Psychiatric/Behavioral:  Positive for sleep disturbance. Negative for dysphoric mood. The patient is not nervous/anxious.      Depression screen [facility] 2/9 09/09/2020 04/26/2020 04/26/2020  Decreased Interest 0 2 1  Down, Depressed, Hopeless 0 0 0  PHQ - 2 Score 0 2 1  Altered sleeping 3 3 -  Tired, decreased energy 0 2 -  Change in appetite 2 3 -  Feeling bad or failure about yourself  0 0 -  Trouble concentrating 0 3 -  Moving slowly or fidgety/restless 0 2 -  Suicidal thoughts 0 0 -  PHQ-9 Score 5 15 -  Difficult doing work/chores Not difficult at all Not difficult at all -     Objective:    Physical Exam Vitals and nursing note reviewed.  Constitutional:      Appearance: Normal appearance.  HENT:     Head: Normocephalic and atraumatic.  Neurological:     Mental Status: She is alert.  Psychiatric:        Attention and Perception: Attention normal.        Mood and Affect: Affect is angry.        Behavior: Behavior is agitated.    Ht _0  (1.549 m)   BMI 29.59 kg/m  Wt Readings from Last 3 Encounters:  04/26/20 156 lb 9.6 oz (71 kg)  09/15/19 157 lb 12.8 oz (71.6 kg)  05/26/19 155 lb (70.3 kg)     Health Maintenance Due  Topic Date Due   Zoster Vaccines- Shingrix (1 of 2) Never done   COLONOSCOPY (Pts 45-25yr Insurance coverage will need to be confirmed)  06/05/2020    There are no preventive care reminders to display for this patient.  Lab Results  Component Value Date   TSH 2.16  04/26/2020   Lab Results  Component Value Date   WBC 6.5 04/26/2020   HGB 12.7 04/26/2020   HCT 39.6 04/26/2020   MCV 64.1 Repeated and verified X2. (L) 04/26/2020   PLT 336.0 04/26/2020  Lab Results  Component Value Date   NA 140 04/26/2020   K 3.8 04/26/2020   CO2 27 04/26/2020   GLUCOSE 86 04/26/2020   BUN 15 04/26/2020   CREATININE 0.79 04/26/2020   BILITOT 0.8 03/23/2019   ALKPHOS 54 03/23/2019   AST 21 03/23/2019   ALT 21 03/23/2019   PROT 7.1 03/23/2019   ALBUMIN 4.2 03/23/2019   CALCIUM 9.5 04/26/2020   ANIONGAP 14 05/22/2019   GFR 83.48 04/26/2020   Lab Results  Component Value Date   CHOL 133 08/11/2018   Lab Results  Component Value Date   HDL 48.10 08/11/2018   Lab Results  Component Value Date   LDLCALC 72 08/11/2018   Lab Results  Component Value Date   TRIG 66.0 08/11/2018   Lab Results  Component Value Date   CHOLHDL 3 08/11/2018   Lab Results  Component Value Date   HGBA1C 6.1 11/17/2015      Assessment & Plan:   Problem List Items Addressed This Visit       Other   Anxiety with depression - Primary   Relevant Orders   Ambulatory referral to Psychiatry    No orders of the defined types were placed in this encounter.   Follow-up: Return if symptoms worsen or fail to improve.  Patient ended the visit abruptly and did not answer when I tried to reconnect.   Libby Maw, MD  Virtual Visit via Video Note  I connected with Kaitlyn Solomon on 09/09/20 at  2:00 PM EDT by a video enabled telemedicine application and verified that I am speaking with the correct person using two identifiers.  Location: Patient: at home alone in a room. Provider: work   I discussed the limitations of evaluation and management by telemedicine and the availability of in person appointments. The patient expressed understanding and agreed to proceed.  History of Present Illness:    Observations/Objective:   Assessment and  Plan:   Follow Up Instructions:    I discussed the assessment and treatment plan with the patient. The patient was provided an opportunity to ask questions and all were answered. The patient agreed with the plan and demonstrated an understanding of the instructions.   The patient was advised to call back or seek an in-person evaluation if the symptoms worsen or if the condition fails to improve as anticipated.  I provided 20 minutes of non-face-to-face time during this encounter.   Libby Maw, MD

## 2020-09-15 DIAGNOSIS — M76822 Posterior tibial tendinitis, left leg: Secondary | ICD-10-CM | POA: Diagnosis not present

## 2020-09-16 ENCOUNTER — Other Ambulatory Visit: Payer: Self-pay | Admitting: Family Medicine

## 2020-09-16 DIAGNOSIS — F339 Major depressive disorder, recurrent, unspecified: Secondary | ICD-10-CM

## 2020-10-13 DIAGNOSIS — M2142 Flat foot [pes planus] (acquired), left foot: Secondary | ICD-10-CM | POA: Diagnosis not present

## 2020-10-13 DIAGNOSIS — M76822 Posterior tibial tendinitis, left leg: Secondary | ICD-10-CM | POA: Diagnosis not present

## 2020-11-08 DIAGNOSIS — M76822 Posterior tibial tendinitis, left leg: Secondary | ICD-10-CM | POA: Diagnosis not present

## 2020-11-08 DIAGNOSIS — M25572 Pain in left ankle and joints of left foot: Secondary | ICD-10-CM | POA: Diagnosis not present

## 2020-11-15 DIAGNOSIS — M25572 Pain in left ankle and joints of left foot: Secondary | ICD-10-CM | POA: Diagnosis not present

## 2020-11-17 DIAGNOSIS — M25572 Pain in left ankle and joints of left foot: Secondary | ICD-10-CM | POA: Diagnosis not present

## 2020-11-22 DIAGNOSIS — M25572 Pain in left ankle and joints of left foot: Secondary | ICD-10-CM | POA: Diagnosis not present

## 2020-11-24 DIAGNOSIS — Z4789 Encounter for other orthopedic aftercare: Secondary | ICD-10-CM | POA: Diagnosis not present

## 2020-11-24 DIAGNOSIS — M25572 Pain in left ankle and joints of left foot: Secondary | ICD-10-CM | POA: Diagnosis not present

## 2020-11-29 DIAGNOSIS — M25572 Pain in left ankle and joints of left foot: Secondary | ICD-10-CM | POA: Diagnosis not present

## 2020-12-01 DIAGNOSIS — M25572 Pain in left ankle and joints of left foot: Secondary | ICD-10-CM | POA: Diagnosis not present

## 2020-12-08 DIAGNOSIS — M25572 Pain in left ankle and joints of left foot: Secondary | ICD-10-CM | POA: Diagnosis not present

## 2020-12-26 DIAGNOSIS — M79672 Pain in left foot: Secondary | ICD-10-CM | POA: Diagnosis not present

## 2020-12-26 DIAGNOSIS — Z09 Encounter for follow-up examination after completed treatment for conditions other than malignant neoplasm: Secondary | ICD-10-CM | POA: Diagnosis not present

## 2020-12-26 DIAGNOSIS — M76822 Posterior tibial tendinitis, left leg: Secondary | ICD-10-CM | POA: Diagnosis not present

## 2021-02-02 ENCOUNTER — Other Ambulatory Visit: Payer: Self-pay | Admitting: Family Medicine

## 2021-02-02 DIAGNOSIS — E039 Hypothyroidism, unspecified: Secondary | ICD-10-CM

## 2021-02-02 NOTE — Telephone Encounter (Signed)
Chart supports rx refill Last ov: 09/09/2020 Last refill: 11/08/2020

## 2021-02-04 ENCOUNTER — Other Ambulatory Visit: Payer: Self-pay | Admitting: Family Medicine

## 2021-02-04 DIAGNOSIS — I10 Essential (primary) hypertension: Secondary | ICD-10-CM

## 2021-02-23 DIAGNOSIS — Z09 Encounter for follow-up examination after completed treatment for conditions other than malignant neoplasm: Secondary | ICD-10-CM | POA: Diagnosis not present

## 2021-02-23 DIAGNOSIS — M79672 Pain in left foot: Secondary | ICD-10-CM | POA: Diagnosis not present

## 2021-02-23 DIAGNOSIS — M76822 Posterior tibial tendinitis, left leg: Secondary | ICD-10-CM | POA: Diagnosis not present

## 2021-03-25 ENCOUNTER — Emergency Department (HOSPITAL_COMMUNITY)
Admission: EM | Admit: 2021-03-25 | Discharge: 2021-03-26 | Disposition: A | Discharge status: Home / Self Care | Payer: Federal, State, Local not specified - PPO | Attending: Emergency Medicine | Admitting: Emergency Medicine

## 2021-03-25 ENCOUNTER — Encounter (HOSPITAL_COMMUNITY): Payer: Self-pay | Admitting: *Deleted

## 2021-03-25 ENCOUNTER — Other Ambulatory Visit: Payer: Self-pay

## 2021-03-25 DIAGNOSIS — I10 Essential (primary) hypertension: Secondary | ICD-10-CM | POA: Insufficient documentation

## 2021-03-25 DIAGNOSIS — I517 Cardiomegaly: Secondary | ICD-10-CM | POA: Diagnosis not present

## 2021-03-25 DIAGNOSIS — R519 Headache, unspecified: Secondary | ICD-10-CM | POA: Diagnosis not present

## 2021-03-25 DIAGNOSIS — F419 Anxiety disorder, unspecified: Secondary | ICD-10-CM | POA: Diagnosis not present

## 2021-03-25 DIAGNOSIS — Z20822 Contact with and (suspected) exposure to covid-19: Secondary | ICD-10-CM | POA: Diagnosis not present

## 2021-03-25 DIAGNOSIS — R0789 Other chest pain: Secondary | ICD-10-CM | POA: Insufficient documentation

## 2021-03-25 DIAGNOSIS — Z79899 Other long term (current) drug therapy: Secondary | ICD-10-CM | POA: Diagnosis not present

## 2021-03-25 DIAGNOSIS — R079 Chest pain, unspecified: Secondary | ICD-10-CM | POA: Diagnosis not present

## 2021-03-25 NOTE — ED Triage Notes (Signed)
Pt from home c/o centralized, nonradiating chest pain that started yesterday and headache that started tonight. Initially CP described as tightness, now described as dull, ache.

## 2021-03-26 ENCOUNTER — Encounter (HOSPITAL_COMMUNITY): Payer: Self-pay | Admitting: Emergency Medicine

## 2021-03-26 ENCOUNTER — Emergency Department (HOSPITAL_COMMUNITY): Payer: Federal, State, Local not specified - PPO

## 2021-03-26 DIAGNOSIS — I517 Cardiomegaly: Secondary | ICD-10-CM | POA: Diagnosis not present

## 2021-03-26 DIAGNOSIS — R079 Chest pain, unspecified: Secondary | ICD-10-CM | POA: Diagnosis not present

## 2021-03-26 LAB — CBC
HCT: 43.7 % (ref 36.0–46.0)
Hemoglobin: 12.8 g/dL (ref 12.0–15.0)
MCH: 19.6 pg — ABNORMAL LOW (ref 26.0–34.0)
MCHC: 29.3 g/dL — ABNORMAL LOW (ref 30.0–36.0)
MCV: 67 fL — ABNORMAL LOW (ref 80.0–100.0)
Platelets: 415 10*3/uL — ABNORMAL HIGH (ref 150–400)
RBC: 6.52 MIL/uL — ABNORMAL HIGH (ref 3.87–5.11)
RDW: 19.6 % — ABNORMAL HIGH (ref 11.5–15.5)
WBC: 8.1 10*3/uL (ref 4.0–10.5)
nRBC: 0 % (ref 0.0–0.2)

## 2021-03-26 LAB — BASIC METABOLIC PANEL
Anion gap: 10 (ref 5–15)
BUN: 15 mg/dL (ref 6–20)
CO2: 28 mmol/L (ref 22–32)
Calcium: 9.9 mg/dL (ref 8.9–10.3)
Chloride: 102 mmol/L (ref 98–111)
Creatinine, Ser: 0.91 mg/dL (ref 0.44–1.00)
GFR, Estimated: >60 mL/min (ref >60)
Glucose, Bld: 101 mg/dL — ABNORMAL HIGH (ref 70–99)
Potassium: 3.7 mmol/L (ref 3.5–5.1)
Sodium: 140 mmol/L (ref 135–145)

## 2021-03-26 LAB — D-DIMER, QUANTITATIVE: D-Dimer, Quant: <0.27 ug/mL-FEU (ref 0.00–0.50)

## 2021-03-26 LAB — RESP PANEL BY RT-PCR (FLU A&B, COVID) ARPGX2
Influenza A by PCR: NEGATIVE
Influenza B by PCR: NEGATIVE
SARS Coronavirus 2 by RT PCR: NEGATIVE

## 2021-03-26 LAB — TROPONIN I (HIGH SENSITIVITY)
Troponin I (High Sensitivity): 4 ng/L (ref <18)
Troponin I (High Sensitivity): 5 ng/L (ref <18)

## 2021-03-26 MED ORDER — DIAZEPAM 5 MG PO TABS
5.0000 mg | ORAL_TABLET | Freq: Once | ORAL | Status: AC
Start: 2021-03-26 — End: 2021-03-26
  Administered 2021-03-26: 5 mg via ORAL
  Filled 2021-03-26: qty 1

## 2021-03-26 MED ORDER — KETOROLAC TROMETHAMINE 15 MG/ML IJ SOLN
15.0000 mg | Freq: Once | INTRAMUSCULAR | Status: AC
  Administered 2021-03-26: 15 mg via INTRAVENOUS
  Filled 2021-03-26: qty 1

## 2021-03-26 MED ORDER — FAMOTIDINE IN NACL 20-0.9 MG/50ML-% IV SOLN
20.0000 mg | Freq: Once | INTRAVENOUS | Status: AC
  Administered 2021-03-26: 20 mg via INTRAVENOUS
  Filled 2021-03-26: qty 50

## 2021-03-26 MED ORDER — HYDROXYZINE HCL 25 MG PO TABS: 25.0000 mg | ORAL_TABLET | Freq: Four times a day (QID) | ORAL | 0 refills | Status: DC | PRN

## 2021-03-26 MED ORDER — ALUM & MAG HYDROXIDE-SIMETH 200-200-20 MG/5ML PO SUSP
30.0000 mL | Freq: Once | ORAL | Status: AC
  Administered 2021-03-26: 30 mL via ORAL
  Filled 2021-03-26: qty 30

## 2021-03-26 MED ORDER — LIDOCAINE VISCOUS HCL 2 % MT SOLN
15.0000 mL | Freq: Once | OROMUCOSAL | Status: AC
  Administered 2021-03-26: 15 mL via ORAL
  Filled 2021-03-26: qty 15

## 2021-03-26 MED ORDER — SODIUM CHLORIDE 0.9 % IV BOLUS
1000.0000 mL | Freq: Once | INTRAVENOUS | Status: AC
  Administered 2021-03-26: 1000 mL via INTRAVENOUS

## 2021-03-26 NOTE — Discharge Instructions (Signed)
It was a pleasure caring for you today in the emergency department. ° °Please return to the emergency department for any worsening or worrisome symptoms. ° ° °

## 2021-03-26 NOTE — ED Notes (Signed)
Pt taken to xray 

## 2021-03-26 NOTE — ED Provider Notes (Signed)
[facility] EMERGENCY DEPARTMENT Provider Note   CSN: [id number] Arrival date & time: 03/25/21  2341     History  Chief Complaint  Patient presents with   Chest Pain   Headache    Kaitlyn Solomon is a 56 y.o. female.  This is a 56 y.o. female  with significant medical history as below, including anxiety/depression, IBS who presents to the ED with complaint of cp.  Pain is midsternal, ongoing x2 days.  Pressure, aching, tightness.  Pain is constant.  Not associate with dyspnea, diaphoresis, nausea or vomiting.  No trauma.  No illicit drug use.  No stimulants, no cocaine.  Patient does report that her daughter recently moved out 2 days ago at the proximal onset of the symptoms, she is been having increased stress and anxiety associated with this family event.  Took Xanax yesterday which did improve her symptoms.  Denies change in bowel or bladder function, no fevers or chills.  No trauma, no head injury.      Past Medical History: No date: Anxiety state, unspecified No date: Depressive disorder, not elsewhere classified 07/05/2011: Family history of breast cancer     Comment:  Pt states she was BRCA neg about 2009 No date: Fibroid tumor 2007: Irregular menses No date: Irritable bowel syndrome No date: Unspecified essential hypertension 06/2011: Vitamin D deficiency  Past Surgical History: 08/2008: ABDOMINAL HYSTERECTOMY No date: DILATION AND CURETTAGE OF UTERUS 1998: WISDOM TOOTH EXTRACTION    The history is provided by the patient. No language interpreter was used.  Chest Pain Associated symptoms: no abdominal pain, no cough, no dysphagia, no fever, no nausea, no numbness, no palpitations, no shortness of breath, no vomiting and no weakness   Headache Associated symptoms: no abdominal pain, no cough, no diarrhea, no fever, no nausea, no numbness, no photophobia, no vomiting and no weakness       Home Medications Prior to Admission medications    Medication Sig Start Date End Date Taking? Authorizing Provider  hydrOXYzine (ATARAX) 25 MG tablet Take 1 tablet (25 mg total) by mouth every 6 (six) hours as needed. 03/26/21  Yes Wynona Dove A, DO  amLODipine (NORVASC) 10 MG tablet TAKE 1 TABLET(10 MG) BY MOUTH DAILY 02/06/21   Libby Maw, MD  cholecalciferol (VITAMIN D) 1000 UNITS tablet Take 1,000 Units by mouth daily.    [provider]  cyclobenzaprine (FLEXERIL) 5 MG tablet Take 1 tablet (5 mg total) by mouth at bedtime as needed for muscle spasms. 03/06/19   Cirigliano, Garvin Fila, DO  ibuprofen (ADVIL,MOTRIN) 200 MG tablet Take 200 mg by mouth every 6 (six) hours as needed. Take 1-2 tabs tid prn    [provider]  levothyroxine (SYNTHROID) 75 MCG tablet TAKE 1 TABLET(75 MCG) BY MOUTH DAILY 02/02/21   Libby Maw, MD  Multiple Vitamin (MULTIVITAMIN) tablet Take 1 tablet by mouth daily.    [provider]  venlafaxine XR (EFFEXOR XR) 75 MG 24 hr capsule Take 1 capsule (75 mg total) by mouth daily with breakfast. Patient not taking: Reported on 09/09/2020 04/26/20   Libby Maw, MD      Allergies    Lamotrigine and Codeine    Review of Systems   Review of Systems  Constitutional:  Negative for chills and fever.  HENT:  Negative for facial swelling and trouble swallowing.   Eyes:  Negative for photophobia and visual disturbance.  Respiratory:  Negative for cough and shortness of breath.  Cardiovascular:  Positive for chest pain. Negative for palpitations and leg swelling.  Gastrointestinal:  Negative for abdominal pain, diarrhea, nausea and vomiting.  Endocrine: Negative for polydipsia and polyuria.  Genitourinary:  Negative for difficulty urinating and hematuria.  Musculoskeletal:  Negative for gait problem and joint swelling.  Skin:  Negative for pallor and rash.  Neurological:  Negative for syncope, weakness and numbness.  Psychiatric/Behavioral:  Negative for agitation and  confusion. The patient is nervous/anxious.    Physical Exam Updated Vital Signs BP (!) 145/98    Pulse 93    Temp 98.5 F (36.9 C) (Oral)    Resp 16    SpO2 99%  Physical Exam Vitals and nursing note reviewed.  Constitutional:      General: She is not in acute distress.    Appearance: Normal appearance. She is well-developed.  HENT:     Head: Normocephalic and atraumatic.     Right Ear: External ear normal.     Left Ear: External ear normal.     Nose: Nose normal.     Mouth/Throat:     Mouth: Mucous membranes are moist.  Eyes:     General: No scleral icterus.       Right eye: No discharge.        Left eye: No discharge.  Cardiovascular:     Rate and Rhythm: Normal rate and regular rhythm.     Pulses: Normal pulses.          Radial pulses are 2+ on the right side and 2+ on the left side.       Dorsalis pedis pulses are 2+ on the right side and 2+ on the left side.     Heart sounds: Normal heart sounds. No murmur heard.   No friction rub. No gallop. No S3 or S4 sounds.  Pulmonary:     Effort: Pulmonary effort is normal. No tachypnea or respiratory distress.     Breath sounds: Normal breath sounds.  Abdominal:     General: Abdomen is flat.     Tenderness: There is no abdominal tenderness.  Musculoskeletal:        General: Normal range of motion.     Cervical back: Normal range of motion.     Right lower leg: No edema.     Left lower leg: No edema.  Skin:    General: Skin is warm and dry.     Capillary Refill: Capillary refill takes less than 2 seconds.  Neurological:     Mental Status: She is alert and oriented to person, place, and time.     GCS: GCS eye subscore is 4. GCS verbal subscore is 5. GCS motor subscore is 6.  Psychiatric:        Mood and Affect: Mood is anxious.        Behavior: Behavior normal. Behavior is cooperative.     Comments: Nervous, anxious. Not acutely psychotic     ED Results / Procedures / Treatments   Labs (all labs ordered are listed, but  only abnormal results are displayed) Labs Reviewed  BASIC METABOLIC PANEL - Abnormal; Notable for the following components:      Result Value   Glucose, Bld 101 (*)    All other components within normal limits  CBC - Abnormal; Notable for the following components:   RBC 6.52 (*)    MCV 67.0 (*)    MCH 19.6 (*)    MCHC 29.3 (*)    RDW 19.6 (*)  Platelets 415 (*)    All other components within normal limits  RESP PANEL BY RT-PCR (FLU A&B, COVID) ARPGX2  D-DIMER, QUANTITATIVE  TROPONIN I (HIGH SENSITIVITY)  TROPONIN I (HIGH SENSITIVITY)    EKG EKG Interpretation  Date/Time:  Saturday March 25 2021 23:50:49 EST Ventricular Rate:  87 PR Interval:  138 QRS Duration: 92 QT Interval:  358 QTC Calculation: 430 R Axis:   17 Text Interpretation: Normal sinus rhythm Minimal voltage criteria for LVH, may be normal variant ( R in aVL ) T wave abnormality, consider inferior ischemia Abnormal ECG When compared with ECG of 22-May-2019 09:23, PREVIOUS ECG IS PRESENT similar to prior no stemi Confirmed by Wynona Dove ([id number]) on 03/26/2021 2:50:07 AM  Radiology DG Chest 2 View  Result Date: 03/26/2021 CLINICAL DATA:  Chest pain. EXAM: CHEST - 2 VIEW COMPARISON:  PA Lat 05/23/2019 FINDINGS: The heart is slightly enlarged. Stable mediastinum with aortic tortuosity and atherosclerosis. No vascular congestion is seen. The lungs are clear. The sulci are sharp. Eventrations are noted along both hemidiaphragms. Mild osteopenia and thoracic kyphosis. IMPRESSION: No active cardiopulmonary disease. Stable chest with cardiomegaly. Aortic atherosclerosis. Electronically Signed   By: Telford Nab M.D.   On: 03/26/2021 00:18    Procedures Procedures    Medications Ordered in ED Medications  diazepam (VALIUM) tablet 5 mg (5 mg Oral Given 03/26/21 0055)  famotidine (PEPCID) IVPB 20 mg premix (0 mg Intravenous Stopped 03/26/21 0205)  alum & mag hydroxide-simeth (MAALOX/MYLANTA) 200-200-20 MG/5ML  suspension 30 mL (30 mLs Oral Given 03/26/21 0055)    And  lidocaine (XYLOCAINE) 2 % viscous mouth solution 15 mL (15 mLs Oral Given 03/26/21 0055)  sodium chloride 0.9 % bolus 1,000 mL (0 mLs Intravenous Stopped 03/26/21 0205)  ketorolac (TORADOL) 15 MG/ML injection 15 mg (15 mg Intravenous Given 03/26/21 0248)    ED Course/ Medical Decision Making/ A&P                           Medical Decision Making Amount and/or Complexity of Data Reviewed External Data Reviewed: labs, radiology, ECG and notes. Labs: ordered. Decision-making details documented in ED Course. Radiology: ordered. Decision-making details documented in ED Course. ECG/medicine tests: ordered and independent interpretation performed. Decision-making details documented in ED Course.  Risk OTC drugs. Prescription drug management.    CC: cp, anxiousness  This patient presents to the Emergency Department for the above complaint. This involves an extensive number of treatment options and is a complaint that carries with it a high risk of complications and morbidity. Vital signs were reviewed. Serious etiologies considered.  Record review:  Previous records obtained and reviewed   Medical and surgical history as noted above.   Work up as above, notable for:  Labs & imaging results that were available during my care of the patient were reviewed by me and considered in my medical decision making.   I ordered imaging studies which included chest x-ray and I independently visualized and interpreted imaging which showed no acute process  Cardiac monitoring reviewed and interpreted personally which shows NSR  Social determinants of health include - N/a  Management: Patient given Valium, GI cocktail, analgesics  Reassessment:  Patient reports symptoms have resolved in their entirety.  Feels anxiety is much better after the Valium.  She is resting comfortably in the bed.  Asymptomatic currently.  The patient's chest pain  is not suggestive of pulmonary embolus, cardiac ischemia, aortic dissection, pericarditis, myocarditis, pulmonary  embolism, pneumothorax, pneumonia, Zoster, or esophageal perforation, or other serious etiology.  Historically not abrupt in onset, tearing or ripping, pulses symmetric. EKG nonspecific for ischemia/infarction. No dysrhythmias, brugada, WPW, prolonged QT noted.   Troponin negative x2. CXR reviewed. Labs without demonstration of acute pathology unless otherwise noted above. Low HEART Score: 0-3 points (0.9-1.7% risk of MACE).  Given the extremely low risk of these diagnoses further testing and evaluation for these possibilities does not appear to be indicated at this time. Patient in no distress and overall condition improved here in the ED.   Give patient a short course of Atarax, outpatient follow-up with counseling.  F/u w/ PCP.   Detailed discussions were had with the patient regarding current findings, and need for close f/u with PCP or on call doctor. The patient has been instructed to return immediately if the symptoms worsen in any way for re-evaluation. Patient verbalized understanding and is in agreement with current care plan. All questions answered prior to discharge.        This chart was dictated using voice recognition software.  Despite best efforts to proofread,  errors can occur which can change the documentation meaning.         Final Clinical Impression(s) / ED Diagnoses Final diagnoses:  Atypical chest pain  Anxiousness    Rx / DC Orders ED Discharge Orders          Ordered    hydrOXYzine (ATARAX) 25 MG tablet  Every 6 hours PRN        03/26/21 0308              Jeanell Sparrow, DO 03/26/21 [phone]

## 2021-03-29 ENCOUNTER — Ambulatory Visit (INDEPENDENT_AMBULATORY_CARE_PROVIDER_SITE_OTHER): Payer: Federal, State, Local not specified - PPO | Admitting: Family Medicine

## 2021-03-29 ENCOUNTER — Other Ambulatory Visit: Payer: Self-pay

## 2021-03-29 ENCOUNTER — Encounter: Payer: Self-pay | Admitting: Family Medicine

## 2021-03-29 VITALS — BP 124/80 | HR 77 | Temp 96.1°F | Ht 61.0 in | Wt 160.6 lb

## 2021-03-29 DIAGNOSIS — F411 Generalized anxiety disorder: Secondary | ICD-10-CM

## 2021-03-29 DIAGNOSIS — I10 Essential (primary) hypertension: Secondary | ICD-10-CM

## 2021-03-29 DIAGNOSIS — F419 Anxiety disorder, unspecified: Secondary | ICD-10-CM

## 2021-03-29 DIAGNOSIS — R718 Other abnormality of red blood cells: Secondary | ICD-10-CM | POA: Diagnosis not present

## 2021-03-29 DIAGNOSIS — E039 Hypothyroidism, unspecified: Secondary | ICD-10-CM

## 2021-03-29 DIAGNOSIS — D563 Thalassemia minor: Secondary | ICD-10-CM | POA: Insufficient documentation

## 2021-03-29 DIAGNOSIS — Z0000 Encounter for general adult medical examination without abnormal findings: Secondary | ICD-10-CM

## 2021-03-29 DIAGNOSIS — F418 Other specified anxiety disorders: Secondary | ICD-10-CM | POA: Diagnosis not present

## 2021-03-29 LAB — URINALYSIS, ROUTINE W REFLEX MICROSCOPIC
Bilirubin Urine: NEGATIVE
Hgb urine dipstick: NEGATIVE
Ketones, ur: NEGATIVE
Nitrite: NEGATIVE
RBC / HPF: NONE SEEN (ref 0–?)
Specific Gravity, Urine: 1.015 (ref 1.000–1.030)
Total Protein, Urine: NEGATIVE
Urine Glucose: NEGATIVE
Urobilinogen, UA: 0.2 (ref 0.0–1.0)
pH: 6 (ref 5.0–8.0)

## 2021-03-29 MED ORDER — HYDROXYZINE HCL 25 MG PO TABS: 25.0000 mg | ORAL_TABLET | Freq: Four times a day (QID) | ORAL | 0 refills | Status: DC | PRN

## 2021-03-29 MED ORDER — VENLAFAXINE HCL ER 37.5 MG PO CP24: 37.5000 mg | ORAL_CAPSULE | Freq: Every day | ORAL | 1 refills | Status: DC
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
## 2021-04-04 ENCOUNTER — Encounter: Payer: Self-pay | Admitting: Family Medicine

## 2021-04-18 DIAGNOSIS — L910 Hypertrophic scar: Secondary | ICD-10-CM | POA: Diagnosis not present

## 2021-04-18 DIAGNOSIS — T22392D Burn of third degree of multiple sites of left shoulder and upper limb, except wrist and hand, subsequent encounter: Secondary | ICD-10-CM | POA: Diagnosis not present

## 2021-04-18 DIAGNOSIS — T24202A Burn of second degree of unspecified site of left lower limb, except ankle and foot, initial encounter: Secondary | ICD-10-CM | POA: Diagnosis not present

## 2021-04-18 DIAGNOSIS — T24201A Burn of second degree of unspecified site of right lower limb, except ankle and foot, initial encounter: Secondary | ICD-10-CM | POA: Diagnosis not present

## 2021-04-18 DIAGNOSIS — X088XXA Exposure to other specified smoke, fire and flames, initial encounter: Secondary | ICD-10-CM | POA: Diagnosis not present

## 2021-04-18 DIAGNOSIS — M792 Neuralgia and neuritis, unspecified: Secondary | ICD-10-CM | POA: Diagnosis not present

## 2021-04-18 DIAGNOSIS — T24302D Burn of third degree of unspecified site of left lower limb, except ankle and foot, subsequent encounter: Secondary | ICD-10-CM | POA: Diagnosis not present

## 2021-04-18 DIAGNOSIS — T24391D Burn of third degree of multiple sites of right lower limb, except ankle and foot, subsequent encounter: Secondary | ICD-10-CM | POA: Diagnosis not present

## 2021-04-18 DIAGNOSIS — L299 Pruritus, unspecified: Secondary | ICD-10-CM | POA: Diagnosis not present

## 2021-05-05 ENCOUNTER — Other Ambulatory Visit: Payer: Self-pay | Admitting: Family Medicine

## 2021-05-05 DIAGNOSIS — E039 Hypothyroidism, unspecified: Secondary | ICD-10-CM

## 2021-05-08 ENCOUNTER — Encounter: Payer: Self-pay | Admitting: Emergency Medicine

## 2021-05-08 ENCOUNTER — Emergency Department
Admission: EM | Admit: 2021-05-08 | Discharge: 2021-05-08 | Disposition: A | Discharge status: Home / Self Care | Payer: Federal, State, Local not specified - PPO | Attending: Student in an Organized Health Care Education/Training Program | Admitting: Student in an Organized Health Care Education/Training Program

## 2021-05-08 ENCOUNTER — Emergency Department: Payer: Federal, State, Local not specified - PPO

## 2021-05-08 ENCOUNTER — Other Ambulatory Visit: Payer: Self-pay

## 2021-05-08 DIAGNOSIS — S060X0A Concussion without loss of consciousness, initial encounter: Secondary | ICD-10-CM | POA: Diagnosis not present

## 2021-05-08 DIAGNOSIS — F419 Anxiety disorder, unspecified: Secondary | ICD-10-CM | POA: Diagnosis not present

## 2021-05-08 DIAGNOSIS — S0990XA Unspecified injury of head, initial encounter: Secondary | ICD-10-CM | POA: Diagnosis not present

## 2021-05-08 DIAGNOSIS — Y990 Civilian activity done for income or pay: Secondary | ICD-10-CM | POA: Insufficient documentation

## 2021-05-08 DIAGNOSIS — W208XXA Other cause of strike by thrown, projected or falling object, initial encounter: Secondary | ICD-10-CM | POA: Diagnosis not present

## 2021-05-08 DIAGNOSIS — R0789 Other chest pain: Secondary | ICD-10-CM | POA: Diagnosis not present

## 2021-05-08 DIAGNOSIS — S0083XA Contusion of other part of head, initial encounter: Secondary | ICD-10-CM | POA: Diagnosis not present

## 2021-05-08 DIAGNOSIS — S20219A Contusion of unspecified front wall of thorax, initial encounter: Secondary | ICD-10-CM | POA: Diagnosis not present

## 2021-05-08 MED ORDER — METOCLOPRAMIDE HCL 10 MG PO TABS
10.0000 mg | ORAL_TABLET | Freq: Once | ORAL | Status: AC
  Administered 2021-05-08: 10 mg via ORAL
  Filled 2021-05-08: qty 1
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
## 2021-05-08 NOTE — Discharge Instructions (Signed)
Your exam and head CT are reassuring at this time.  Her symptoms are consistent with a head contusion and a mild concussion.  Take the prescription nausea medicine along with over-the-counter Tylenol and Motrin as needed for headache pain relief.  Rest and hydrate as necessary.  Follow-up with your primary provider or return to the ED if needed. ?
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
## 2021-05-08 NOTE — ED Notes (Signed)
This nurse attempted to call [employer] to verify UDS  no answer    message left ?

## 2021-05-15 ENCOUNTER — Ambulatory Visit: Payer: Federal, State, Local not specified - PPO | Admitting: Family Medicine

## 2021-05-15 ENCOUNTER — Encounter: Payer: Self-pay | Admitting: Family Medicine

## 2021-05-15 VITALS — BP 122/78 | HR 72 | Temp 97.4°F | Ht 61.0 in | Wt 155.2 lb

## 2021-05-15 DIAGNOSIS — I10 Essential (primary) hypertension: Secondary | ICD-10-CM

## 2021-05-15 DIAGNOSIS — G4709 Other insomnia: Secondary | ICD-10-CM

## 2021-05-15 DIAGNOSIS — F418 Other specified anxiety disorders: Secondary | ICD-10-CM

## 2021-05-15 MED ORDER — VENLAFAXINE HCL ER 75 MG PO CP24: 75.0000 mg | ORAL_CAPSULE | Freq: Every day | ORAL | 1 refills | Status: DC

## 2021-05-15 MED ORDER — TRAZODONE HCL 50 MG PO TABS
25.0000 mg | ORAL_TABLET | Freq: Every evening | ORAL | 3 refills | Status: DC | PRN
Start: 2021-05-15 — End: 2021-08-14

## 2021-05-15 MED ORDER — AMLODIPINE BESYLATE 10 MG PO TABS: ORAL_TABLET | ORAL | 1 refills | Status: DC
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
## 2021-05-23 DIAGNOSIS — T22392S Burn of third degree of multiple sites of left shoulder and upper limb, except wrist and hand, sequela: Secondary | ICD-10-CM | POA: Diagnosis not present

## 2021-05-23 DIAGNOSIS — L905 Scar conditions and fibrosis of skin: Secondary | ICD-10-CM | POA: Diagnosis not present

## 2021-05-23 DIAGNOSIS — T22391S Burn of third degree of multiple sites of right shoulder and upper limb, except wrist and hand, sequela: Secondary | ICD-10-CM | POA: Diagnosis not present

## 2021-05-23 DIAGNOSIS — M792 Neuralgia and neuritis, unspecified: Secondary | ICD-10-CM | POA: Diagnosis not present

## 2021-06-26 DIAGNOSIS — L905 Scar conditions and fibrosis of skin: Secondary | ICD-10-CM | POA: Diagnosis not present

## 2021-06-26 DIAGNOSIS — Z6829 Body mass index (BMI) 29.0-29.9, adult: Secondary | ICD-10-CM | POA: Diagnosis not present

## 2021-07-05 DIAGNOSIS — H40013 Open angle with borderline findings, low risk, bilateral: Secondary | ICD-10-CM | POA: Diagnosis not present

## 2021-08-13 ENCOUNTER — Other Ambulatory Visit: Payer: Self-pay | Admitting: Family Medicine

## 2021-08-13 DIAGNOSIS — G4709 Other insomnia: Secondary | ICD-10-CM

## 2021-08-13 DIAGNOSIS — F418 Other specified anxiety disorders: Secondary | ICD-10-CM

## 2021-10-30 DIAGNOSIS — Z4789 Encounter for other orthopedic aftercare: Secondary | ICD-10-CM | POA: Diagnosis not present

## 2021-10-30 DIAGNOSIS — M85872 Other specified disorders of bone density and structure, left ankle and foot: Secondary | ICD-10-CM | POA: Diagnosis not present

## 2021-10-30 DIAGNOSIS — M76822 Posterior tibial tendinitis, left leg: Secondary | ICD-10-CM | POA: Diagnosis not present

## 2021-10-30 DIAGNOSIS — M19072 Primary osteoarthritis, left ankle and foot: Secondary | ICD-10-CM | POA: Diagnosis not present

## 2021-11-10 ENCOUNTER — Other Ambulatory Visit: Payer: Self-pay | Admitting: Family Medicine

## 2021-11-10 DIAGNOSIS — I10 Essential (primary) hypertension: Secondary | ICD-10-CM

## 2021-11-18 ENCOUNTER — Other Ambulatory Visit: Payer: Self-pay | Admitting: Family Medicine

## 2021-11-18 DIAGNOSIS — E039 Hypothyroidism, unspecified: Secondary | ICD-10-CM

## 2021-11-22 ENCOUNTER — Other Ambulatory Visit: Payer: Self-pay | Admitting: Family Medicine

## 2021-11-22 DIAGNOSIS — E039 Hypothyroidism, unspecified: Secondary | ICD-10-CM

## 2021-11-28 ENCOUNTER — Telehealth: Payer: Self-pay | Admitting: *Deleted

## 2021-11-28 NOTE — Chronic Care Management (AMB) (Unsigned)
  Care Coordination  Outreach Note  11/28/2021 Name: BRIDGITTE FELICETTI MRN: [id number] DOB: 11/26/1965   Care Coordination Outreach Attempts: An unsuccessful telephone outreach was attempted today to offer the patient information about available care coordination services as a benefit of their health plan.   Follow Up Plan:  Additional outreach attempts will be made to offer the patient care coordination information and services.   Encounter Outcome:  No Answer  [place]  Direct Dial: [phone]

## 2021-11-29 NOTE — Chronic Care Management (AMB) (Signed)
  Care Coordination   Note   11/29/2021 Name: Kaitlyn Solomon MRN: [id number] DOB: 1966/01/19  Kaitlyn Solomon is a 56 y.o. year old female who sees Libby Maw, MD for primary care. I reached out to [place] by phone today to offer care coordination services.  Kaitlyn Solomon was given information about Care Coordination services today including:   The Care Coordination services include support from the care team which includes your Nurse Coordinator, Clinical Social Worker, or Pharmacist.  The Care Coordination team is here to help remove barriers to the health concerns and goals most important to you. Care Coordination services are voluntary, and the patient may decline or stop services at any time by request to their care team member.   Care Coordination Consent Status: Patient agreed to services and verbal consent obtained.   Follow up plan:  Telephone appointment with care coordination team member scheduled for:  12/19/21  Encounter Outcome:  Pt. Scheduled  [place]  Direct Dial: [phone]

## 2021-12-19 ENCOUNTER — Ambulatory Visit: Payer: Self-pay

## 2021-12-19 NOTE — Patient Outreach (Signed)
  Care Coordination   Initial Visit Note   12/19/2021 Name: Kaitlyn Solomon MRN: [id number] DOB: [birth year]  Kaitlyn Solomon Check is a 56 y.o. year old female who sees Libby Maw, MD for primary care. I spoke with  Kaitlyn Solomon Blew by phone today.  What matters to the patients health and wellness today?  The patient stated she has no concerns and did not want to go through the call.  I did let her know that if she needed our services in the future to contact her provider.   Goals Addressed             This Visit's Progress    COMPLETED: Care Coordination Activites - no follow up required        Care Coordination Interventions: Active listening / Reflection utilized  Emotional Support Provided Problem [place] strategies reviewed Discussed/.Educated Care Coordination Program 2.   Discussed/.Educated Annual Wellness Visit 3.   Discussed/.Educated Social Determinates of Health 4.   Please inform PCP if services needed in the future         SDOH assessments and interventions completed:  No     Care Coordination Interventions Activated:  Yes  Care Coordination Interventions:  Yes, provided   Follow up plan: No further intervention required.   Encounter Outcome:  Pt. Visit Completed   Lazaro Arms RN, BSN, [facility]   Phone: [phone]

## 2021-12-19 NOTE — Patient Instructions (Signed)
Visit Information  Thank you for taking time to visit with me today. Please don't hesitate to contact me if I can be of assistance to you.   Following are the goals we discussed today:   Goals Addressed             This Visit's Progress    COMPLETED: Care Coordination Activites - no follow up required        Care Coordination Interventions: Active listening / Reflection utilized  Emotional Support Provided Problem [place] strategies reviewed Discussed/.Educated Care Coordination Program 2.   Discussed/.Educated Annual Wellness Visit 3.   Discussed/.Educated Social Determinates of Health 4.   Please inform PCP if services needed in the future         If you are experiencing a Mental Health or [place] or need someone to talk to, please call 1-800-273-TALK (toll free, 24 hour hotline)  Patient verbalizes understanding of instructions and care plan provided today and agrees to view in [place]. Active MyChart status and patient understanding of how to access instructions and care plan via MyChart confirmed with patient.     Lazaro Arms RN, BSN, [facility]   Phone: [phone]

## 2022-01-08 ENCOUNTER — Ambulatory Visit (INDEPENDENT_AMBULATORY_CARE_PROVIDER_SITE_OTHER): Payer: Federal, State, Local not specified - PPO | Admitting: Family Medicine

## 2022-01-08 ENCOUNTER — Encounter: Payer: Self-pay | Admitting: Family Medicine

## 2022-01-08 VITALS — BP 134/82 | HR 66 | Temp 97.1°F | Ht 61.0 in | Wt 161.8 lb

## 2022-01-08 DIAGNOSIS — Z23 Encounter for immunization: Secondary | ICD-10-CM

## 2022-01-08 DIAGNOSIS — Z0000 Encounter for general adult medical examination without abnormal findings: Secondary | ICD-10-CM | POA: Diagnosis not present

## 2022-01-08 DIAGNOSIS — R718 Other abnormality of red blood cells: Secondary | ICD-10-CM | POA: Diagnosis not present

## 2022-01-08 DIAGNOSIS — M545 Low back pain, unspecified: Secondary | ICD-10-CM | POA: Diagnosis not present

## 2022-01-08 DIAGNOSIS — D509 Iron deficiency anemia, unspecified: Secondary | ICD-10-CM

## 2022-01-08 DIAGNOSIS — I10 Essential (primary) hypertension: Secondary | ICD-10-CM

## 2022-01-08 DIAGNOSIS — E039 Hypothyroidism, unspecified: Secondary | ICD-10-CM | POA: Diagnosis not present

## 2022-01-08 LAB — URINALYSIS, ROUTINE W REFLEX MICROSCOPIC
Bilirubin Urine: NEGATIVE
Hgb urine dipstick: NEGATIVE
Ketones, ur: NEGATIVE
Nitrite: NEGATIVE
Specific Gravity, Urine: 1.01 (ref 1.000–1.030)
Total Protein, Urine: NEGATIVE
Urine Glucose: NEGATIVE
Urobilinogen, UA: 0.2 (ref 0.0–1.0)
pH: 6 (ref 5.0–8.0)

## 2022-01-08 LAB — CBC WITH DIFFERENTIAL/PLATELET
Basophils Absolute: 0.1 10*3/uL (ref 0.0–0.1)
Basophils Relative: 1 % (ref 0.0–3.0)
Eosinophils Absolute: 0.1 10*3/uL (ref 0.0–0.7)
Eosinophils Relative: 1.7 % (ref 0.0–5.0)
HCT: 43.4 % (ref 36.0–46.0)
Hemoglobin: 13.7 g/dL (ref 12.0–15.0)
Lymphocytes Relative: 47.6 % — ABNORMAL HIGH (ref 12.0–46.0)
Lymphs Abs: 3.1 10*3/uL (ref 0.7–4.0)
MCHC: 31.6 g/dL (ref 30.0–36.0)
MCV: 62.8 fl — ABNORMAL LOW (ref 78.0–100.0)
Monocytes Absolute: 0.5 10*3/uL (ref 0.1–1.0)
Monocytes Relative: 7.3 % (ref 3.0–12.0)
Neutro Abs: 2.7 10*3/uL (ref 1.4–7.7)
Neutrophils Relative %: 42.4 % — ABNORMAL LOW (ref 43.0–77.0)
Platelets: 360 10*3/uL (ref 150.0–400.0)
RBC: 6.91 Mil/uL — ABNORMAL HIGH (ref 3.87–5.11)
RDW: 19.2 % — ABNORMAL HIGH (ref 11.5–15.5)
WBC: 6.4 10*3/uL (ref 4.0–10.5)

## 2022-01-08 LAB — COMPREHENSIVE METABOLIC PANEL
ALT: 17 U/L (ref 0–35)
AST: 17 U/L (ref 0–37)
Albumin: 4.9 g/dL (ref 3.5–5.2)
Alkaline Phosphatase: 62 U/L (ref 39–117)
BUN: 19 mg/dL (ref 6–23)
CO2: 28 mEq/L (ref 19–32)
Calcium: 9.5 mg/dL (ref 8.4–10.5)
Chloride: 98 mEq/L (ref 96–112)
Creatinine, Ser: 0.81 mg/dL (ref 0.40–1.20)
GFR: 80.05 mL/min (ref >60.00)
Glucose, Bld: 86 mg/dL (ref 70–99)
Potassium: 3.8 mEq/L (ref 3.5–5.1)
Sodium: 136 mEq/L (ref 135–145)
Total Bilirubin: 0.6 mg/dL (ref 0.2–1.2)
Total Protein: 7.6 g/dL (ref 6.0–8.3)

## 2022-01-08 LAB — LIPID PANEL
Cholesterol: 153 mg/dL (ref 0–200)
HDL: 49.2 mg/dL (ref >39.00)
LDL Cholesterol: 90 mg/dL (ref 0–99)
NonHDL: 103.31
Total CHOL/HDL Ratio: 3
Triglycerides: 66 mg/dL (ref 0.0–149.0)
VLDL: 13.2 mg/dL (ref 0.0–40.0)

## 2022-01-08 LAB — B12 AND FOLATE PANEL
Folate: >23.8 ng/mL (ref >5.9)
Vitamin B-12: 371 pg/mL (ref 211–911)

## 2022-01-08 LAB — TSH: TSH: 2.66 u[IU]/mL (ref 0.35–5.50)

## 2022-01-08 NOTE — Progress Notes (Signed)
Established Patient Office Visit  Subjective   Patient ID: Kaitlyn Solomon, female    DOB: 1965-05-28  Age: 56 y.o. MRN: [id number]  Chief Complaint  Patient presents with   Annual Exam    CPE, concerns about burning in chest and referral about back pains. Patient fasting.     HPI here for a physical exam and follow-up of microcytic anemia, hypertension, hypothyroidism and low back pain.  Ongoing history bilateral lower back pain.  It is nonradiating.  There is no numbness and tingling in her lower extremities.  Heavy lifting at work.  Blood pressure is well controlled with amlodipine 5 mg daily.  Continues levothyroxine 75 micrograms daily.  She has access to dental care but has not been able to go.  She walks for exercise and often times gets 10,000 steps daily between work and exercise.  She has regular female care through GYN.  She will be due for colonoscopy next year.    Review of Systems  Constitutional: Negative.   HENT: Negative.    Eyes:  Negative for blurred vision, discharge and redness.  Respiratory: Negative.    Cardiovascular: Negative.   Gastrointestinal:  Negative for abdominal pain.  Genitourinary: Negative.   Musculoskeletal:  Positive for back pain. Negative for myalgias.  Skin:  Negative for rash.  Neurological:  Negative for tingling, loss of consciousness and weakness.  Endo/Heme/Allergies:  Negative for polydipsia.      01/08/2022    1:33 PM 01/08/2022    1:10 PM 05/15/2021    1:37 PM  Depression screen PHQ 2/9  Decreased Interest 0 0 0  Down, Depressed, Hopeless 0 0 0  PHQ - 2 Score 0 0 0  Altered sleeping 3  3  Tired, decreased energy 1  1  Change in appetite 1  3  Feeling bad or failure about yourself  0  0  Trouble concentrating 1  3  Moving slowly or fidgety/restless 0  0  Suicidal thoughts 0  0  PHQ-9 Score 6  10  Difficult doing work/chores Not difficult at all  Not difficult at all       Objective:     BP 134/82 (BP Location: Right  Arm, Patient Position: Sitting, Cuff Size: Normal)   Pulse 66   Temp (!) 97.1 F (36.2 C) (Temporal)   Ht '5\' 1"'$  (1.549 m)   Wt 161 lb 12.8 oz (73.4 kg)   SpO2 97%   BMI 30.57 kg/m    Physical Exam Constitutional:      General: She is not in acute distress.    Appearance: Normal appearance. She is not ill-appearing, toxic-appearing or diaphoretic.  HENT:     Head: Normocephalic and atraumatic.     Right Ear: External ear normal.     Left Ear: External ear normal.     Mouth/Throat:     Mouth: Mucous membranes are moist.     Pharynx: Oropharynx is clear. No oropharyngeal exudate or posterior oropharyngeal erythema.  Eyes:     General: No scleral icterus.       Right eye: No discharge.        Left eye: No discharge.     Extraocular Movements: Extraocular movements intact.     Conjunctiva/sclera: Conjunctivae normal.     Pupils: Pupils are equal, round, and reactive to light.  Cardiovascular:     Rate and Rhythm: Normal rate and regular rhythm.  Pulmonary:     Effort: Pulmonary effort is normal. No respiratory distress.  Breath sounds: Normal breath sounds.  Abdominal:     General: Bowel sounds are normal.     Tenderness: There is no right CVA tenderness or left CVA tenderness.  Musculoskeletal:     Cervical back: No rigidity or tenderness.  Lymphadenopathy:     Cervical: No cervical adenopathy.  Skin:    General: Skin is warm and dry.  Neurological:     Mental Status: She is alert and oriented to person, place, and time.  Psychiatric:        Mood and Affect: Mood normal.        Behavior: Behavior normal.     y      No results found for any visits on 01/08/22.    The ASCVD Risk score (Arnett DK, et al., 2019) failed to calculate for the following reasons:   Cannot find a previous HDL lab   Cannot find a previous total cholesterol lab    Assessment & Plan:   Problem List Items Addressed This Visit       Cardiovascular and Mediastinum   Essential  hypertension   Relevant Orders   Comprehensive metabolic panel   Urinalysis, Routine w reflex microscopic     Endocrine   Hypothyroidism   Relevant Orders   TSH     Other   ANEMIA   Relevant Orders   CBC with Differential/Platelet   Iron, TIBC and Ferritin Panel   B12 and Folate Panel   Healthcare maintenance - Primary   Relevant Orders   Lipid panel   RBC microcytosis   Relevant Orders   CBC with Differential/Platelet   Iron, TIBC and Ferritin Panel   Other Visit Diagnoses     Need for influenza vaccination       Relevant Orders   Flu Vaccine QUAD 6+ mos PF IM (Fluarix Quad PF)   Bilateral low back pain without sciatica, unspecified chronicity       Relevant Orders   Ambulatory referral to Sports Medicine       Return in about 6 weeks (around 02/19/2022).  Encouraged follow-up with her dentist.  Encouraged to continue walking for exercise.  She will follow-up in 1 month for discuss her other issues.  Referral for lower back pain to sports medicine.  Rechecking.  Deficiency.  She is due for her next colonoscopy in 2024.  We may refer her sooner.  Libby Maw, MD

## 2022-01-09 LAB — IRON,TIBC AND FERRITIN PANEL
%SAT: 24 % (calc) (ref 16–45)
Ferritin: 54 ng/mL (ref 16–232)
Iron: 93 ug/dL (ref 45–160)
TIBC: 394 mcg/dL (calc) (ref 250–450)

## 2022-01-22 ENCOUNTER — Ambulatory Visit: Payer: Federal, State, Local not specified - PPO | Admitting: Family Medicine

## 2022-01-22 VITALS — BP 136/92 | Ht 62.0 in | Wt 161.0 lb

## 2022-01-22 DIAGNOSIS — S39012A Strain of muscle, fascia and tendon of lower back, initial encounter: Secondary | ICD-10-CM

## 2022-01-22 DIAGNOSIS — S29019A Strain of muscle and tendon of unspecified wall of thorax, initial encounter: Secondary | ICD-10-CM

## 2022-01-22 MED ORDER — MELOXICAM 15 MG PO TABS: 15.0000 mg | ORAL_TABLET | Freq: Every day | ORAL | 2 refills | Status: DC

## 2022-01-22 MED ORDER — METHOCARBAMOL 500 MG PO TABS: 500.0000 mg | ORAL_TABLET | Freq: Three times a day (TID) | ORAL | 1 refills | Status: DC | PRN

## 2022-01-22 NOTE — Patient Instructions (Signed)
You have thoracic and lumbar strains. Ok to take tylenol for baseline pain relief (1-2 extra strength tabs 3x/day) Take meloxicam '15mg'$  daily with food for pain and inflammation. Robaxin as needed for muscle spasms (no driving on this medicine if it makes you sleepy). Stay as active as possible. Start physical therapy and do home exercises on days you don't go to therapy. Strengthening of low back muscles, abdominal musculature are key for long term pain relief. If not improving, will consider further imaging (MRI). Follow up with me in 5-6 weeks.

## 2022-01-23 ENCOUNTER — Encounter: Payer: Self-pay | Admitting: Family Medicine

## 2022-01-23 NOTE — Progress Notes (Signed)
PCP: Libby Maw, MD  Subjective:   HPI: Patient is a 56 y.o. female here for back pain.  Patient reports she's had off and on mid and low back pain for several years. She does lift a lot at work but no obvious acute injury. She has learned to lessen weight within containers before moving them. Worse by end of day. Can bother her when she takes a deep breath as well. No radiation into extremities. No numbness/tingling. No bowel/bladder dysfunction.  Past Medical History:  Diagnosis Date   Anxiety state, unspecified    Depressive disorder, not elsewhere classified    Family history of breast cancer 07/05/2011   Pt states she was BRCA neg about 2009   Fibroid tumor    Irregular menses 2007   Irritable bowel syndrome    Unspecified essential hypertension    Vitamin D deficiency 06/2011    Current Outpatient Medications on File Prior to Visit  Medication Sig Dispense Refill   amLODipine (NORVASC) 10 MG tablet TAKE 1 TABLET(10 MG) BY MOUTH DAILY 90 tablet 0   cholecalciferol (VITAMIN D) 1000 UNITS tablet Take 1,000 Units by mouth daily.     Multiple Vitamin (MULTIVITAMIN) tablet Take 1 tablet by mouth daily.     traZODone (DESYREL) 50 MG tablet TAKE 1/2 TO 1 TABLET(25 TO 50 MG) BY MOUTH AT BEDTIME AS NEEDED FOR SLEEP 30 tablet 3   venlafaxine XR (EFFEXOR-XR) 75 MG 24 hr capsule TAKE 1 CAPSULE(75 MG) BY MOUTH DAILY WITH BREAKFAST 90 capsule 1   No current facility-administered medications on file prior to visit.    Past Surgical History:  Procedure Laterality Date   ABDOMINAL HYSTERECTOMY  08/2008   DILATION AND CURETTAGE OF UTERUS     WISDOM TOOTH EXTRACTION  1998    Allergies  Allergen Reactions   Lamotrigine Other (See Comments)    tremors    Codeine Rash and Itching    BP (!) 136/92   Ht _0  (1.575 m)   Wt 161 lb (73 kg)   BMI 29.45 kg/m       No data to display              No data to display              Objective:  Physical  Exam:  Gen: NAD, comfortable in exam room  Back: No gross deformity, scoliosis. TTP paraspinal thoracic and lumbar regions (worse in lumbar).  No midline or bony TTP. FROM with pain on flexion and extension, none with trunk rotation. Strength LEs 5/5 all muscle groups.   2+ MSRs in patellar and achilles tendons, equal bilaterally. Negative SLRs. Sensation intact to light touch bilaterally. Negative logroll, faber, fadir bilaterally.   Assessment & Plan:  1. Chronic back pain - both of thoracic and lumbar regions.  History and exam reassuring, consistent with strain of deep posture muscles, lats.  She will start physical therapy.  Meloxicam with robaxin as needed.  F/u in 5-6 weeks.

## 2022-02-06 ENCOUNTER — Ambulatory Visit: Payer: Federal, State, Local not specified - PPO | Attending: Family Medicine

## 2022-02-06 ENCOUNTER — Other Ambulatory Visit: Payer: Self-pay

## 2022-02-06 DIAGNOSIS — M545 Low back pain, unspecified: Secondary | ICD-10-CM | POA: Diagnosis not present

## 2022-02-06 DIAGNOSIS — M6281 Muscle weakness (generalized): Secondary | ICD-10-CM | POA: Diagnosis not present

## 2022-02-06 DIAGNOSIS — S29019A Strain of muscle and tendon of unspecified wall of thorax, initial encounter: Secondary | ICD-10-CM | POA: Insufficient documentation

## 2022-02-06 DIAGNOSIS — S39012A Strain of muscle, fascia and tendon of lower back, initial encounter: Secondary | ICD-10-CM | POA: Insufficient documentation

## 2022-02-06 DIAGNOSIS — M546 Pain in thoracic spine: Secondary | ICD-10-CM | POA: Insufficient documentation
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
## 2022-02-13 ENCOUNTER — Other Ambulatory Visit: Payer: Self-pay | Admitting: Family Medicine

## 2022-02-13 DIAGNOSIS — I10 Essential (primary) hypertension: Secondary | ICD-10-CM

## 2022-02-14 ENCOUNTER — Ambulatory Visit: Payer: Federal, State, Local not specified - PPO | Attending: Family Medicine | Admitting: Physical Therapy

## 2022-02-14 ENCOUNTER — Encounter: Payer: Self-pay | Admitting: Physical Therapy

## 2022-02-14 DIAGNOSIS — M545 Low back pain, unspecified: Secondary | ICD-10-CM | POA: Insufficient documentation

## 2022-02-14 DIAGNOSIS — M546 Pain in thoracic spine: Secondary | ICD-10-CM | POA: Insufficient documentation

## 2022-02-14 DIAGNOSIS — M6281 Muscle weakness (generalized): Secondary | ICD-10-CM | POA: Insufficient documentation
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
## 2022-02-16 ENCOUNTER — Ambulatory Visit: Payer: Federal, State, Local not specified - PPO | Admitting: Physical Therapy

## 2022-02-16 ENCOUNTER — Encounter: Payer: Self-pay | Admitting: Physical Therapy

## 2022-02-16 DIAGNOSIS — M546 Pain in thoracic spine: Secondary | ICD-10-CM

## 2022-02-16 DIAGNOSIS — M545 Low back pain, unspecified: Secondary | ICD-10-CM

## 2022-02-16 DIAGNOSIS — M6281 Muscle weakness (generalized): Secondary | ICD-10-CM
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
## 2022-02-19 ENCOUNTER — Encounter: Payer: Self-pay | Admitting: Physical Therapy

## 2022-02-19 ENCOUNTER — Ambulatory Visit: Payer: Federal, State, Local not specified - PPO | Admitting: Family Medicine

## 2022-02-19 ENCOUNTER — Ambulatory Visit: Payer: Federal, State, Local not specified - PPO | Admitting: Physical Therapy

## 2022-02-19 DIAGNOSIS — M6281 Muscle weakness (generalized): Secondary | ICD-10-CM

## 2022-02-19 DIAGNOSIS — M546 Pain in thoracic spine: Secondary | ICD-10-CM

## 2022-02-19 DIAGNOSIS — M545 Low back pain, unspecified: Secondary | ICD-10-CM
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
## 2022-02-20 ENCOUNTER — Encounter: Payer: Self-pay | Admitting: Family Medicine

## 2022-02-21 ENCOUNTER — Ambulatory Visit: Payer: Federal, State, Local not specified - PPO | Admitting: Physical Therapy

## 2022-02-23 ENCOUNTER — Ambulatory Visit: Payer: Federal, State, Local not specified - PPO | Admitting: Physical Therapy
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
## 2022-02-27 ENCOUNTER — Ambulatory Visit: Payer: Federal, State, Local not specified - PPO | Admitting: Obstetrics and Gynecology

## 2022-02-28 ENCOUNTER — Ambulatory Visit: Payer: Federal, State, Local not specified - PPO | Admitting: Physical Therapy

## 2022-02-28 DIAGNOSIS — M6281 Muscle weakness (generalized): Secondary | ICD-10-CM

## 2022-02-28 DIAGNOSIS — M545 Low back pain, unspecified: Secondary | ICD-10-CM

## 2022-02-28 DIAGNOSIS — M546 Pain in thoracic spine: Secondary | ICD-10-CM
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
## 2022-03-02 ENCOUNTER — Encounter: Payer: Self-pay | Admitting: Physical Therapy

## 2022-03-02 ENCOUNTER — Ambulatory Visit: Payer: Federal, State, Local not specified - PPO | Admitting: Physical Therapy

## 2022-03-02 DIAGNOSIS — M546 Pain in thoracic spine: Secondary | ICD-10-CM

## 2022-03-02 DIAGNOSIS — M545 Low back pain, unspecified: Secondary | ICD-10-CM

## 2022-03-02 DIAGNOSIS — M6281 Muscle weakness (generalized): Secondary | ICD-10-CM
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
## 2022-03-05 ENCOUNTER — Encounter: Payer: Self-pay | Admitting: Family Medicine

## 2022-03-05 ENCOUNTER — Ambulatory Visit (INDEPENDENT_AMBULATORY_CARE_PROVIDER_SITE_OTHER): Payer: 59 | Admitting: Family Medicine

## 2022-03-05 VITALS — BP 122/88 | Ht 62.0 in | Wt 160.0 lb

## 2022-03-05 DIAGNOSIS — M545 Low back pain, unspecified: Secondary | ICD-10-CM | POA: Diagnosis not present

## 2022-03-05 DIAGNOSIS — G8929 Other chronic pain: Secondary | ICD-10-CM

## 2022-03-05 NOTE — Progress Notes (Addendum)
  SUBJECTIVE:   CHIEF COMPLAINT / HPI:   Back Pain F/u:  Previously seen on 01/22/22 for mid/lower back pain onset several years ago that is intermittent in nature. Pain is worse at the end of her work day; lifts items at work. Patient continues to deny numbness/tingling, radiculopathy, bowel or bladder dysfunction. Last visit, was instructed to continue with physical therapy. She was also given a rx for Meloxicam and Robaxin.   Today, she reports 50% improvement. She reports that therapy has been helpful, still has therapy until the end of February. Taking Meloxicam on work days, five days out of the week. Robaxin she has not been taken at all. Overall, happy with her progress.    PERTINENT  PMH / PSH: IBS, depression, anxiety, anemia, bipolar, chronic pain, h/o subarachnoid hemorrhage   OBJECTIVE:  BP 122/88   Ht '5\' 2"'$  (1.575 m)   Wt 160 lb (72.6 kg)   BMI 29.26 kg/m  Physical Exam  Back Exam:  Inspection: Unremarkable  Palpable tenderness: None. Leg strength: Quad: 5/5 Hamstring: 5/5 Hip flexor: 5/5 Hip abductors: 5/5  Strength at foot: Plantar-flexion: 5/5 Dorsi-flexion: 5/5 Reflexes: 2+ at both patellar tendons, Babinski's downgoing.  Gait unremarkable. SLR laying: Negative  XSLR laying: Negative  FABER: negative.   ASSESSMENT/PLAN:  Chronic left-sided low back pain without sciatica Assessment & Plan: Mid and lower back pain, improving as expected with conservative treatment, no red flags with reassuring exam. Continue with PT. Discussed contraindications to long term NSAIDs, expect she improve and not need Meloxicam regularly and transition to PRN in next coming weeks. Continue with HEP 4-6 weeks after completion of PT. Return if symptoms worsen.     Erskine Emery, MD 03/05/2022, 10:08 AM PGY-2, [place]

## 2022-03-05 NOTE — Assessment & Plan Note (Addendum)
Mid and lower back pain, improving as expected with conservative treatment, no red flags with reassuring exam. Continue with PT. Discussed contraindications to long term NSAIDs, expect she improve and not need Meloxicam regularly and transition to PRN in next coming weeks. Continue with HEP 4-6 weeks after completion of PT. Return if symptoms worsen.
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
## 2022-03-06 ENCOUNTER — Ambulatory Visit: Payer: Federal, State, Local not specified - PPO

## 2022-03-08 ENCOUNTER — Ambulatory Visit: Payer: Federal, State, Local not specified - PPO | Admitting: Physical Therapy

## 2022-03-12 ENCOUNTER — Ambulatory Visit: Payer: Federal, State, Local not specified - PPO | Admitting: Physical Therapy

## 2022-03-15 ENCOUNTER — Ambulatory Visit: Payer: 59 | Admitting: Physical Therapy

## 2022-03-15 DIAGNOSIS — Z1371 Encounter for nonprocreative screening for genetic disease carrier status: Secondary | ICD-10-CM

## 2022-03-15 HISTORY — DX: Encounter for nonprocreative screening for genetic disease carrier status: Z13.71

## 2022-03-19 ENCOUNTER — Ambulatory Visit: Payer: 59 | Admitting: Physical Therapy
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
## 2022-03-20 ENCOUNTER — Encounter: Payer: Self-pay | Admitting: Obstetrics and Gynecology

## 2022-03-20 ENCOUNTER — Ambulatory Visit (INDEPENDENT_AMBULATORY_CARE_PROVIDER_SITE_OTHER): Payer: 59 | Admitting: Obstetrics and Gynecology

## 2022-03-20 VITALS — BP 120/70 | Ht 62.0 in | Wt 162.0 lb

## 2022-03-20 DIAGNOSIS — N898 Other specified noninflammatory disorders of vagina: Secondary | ICD-10-CM | POA: Diagnosis not present

## 2022-03-20 DIAGNOSIS — Z1211 Encounter for screening for malignant neoplasm of colon: Secondary | ICD-10-CM

## 2022-03-20 DIAGNOSIS — Z1231 Encounter for screening mammogram for malignant neoplasm of breast: Secondary | ICD-10-CM

## 2022-03-20 DIAGNOSIS — Z131 Encounter for screening for diabetes mellitus: Secondary | ICD-10-CM

## 2022-03-20 DIAGNOSIS — Z01411 Encounter for gynecological examination (general) (routine) with abnormal findings: Secondary | ICD-10-CM | POA: Diagnosis not present

## 2022-03-20 DIAGNOSIS — Z803 Family history of malignant neoplasm of breast: Secondary | ICD-10-CM | POA: Diagnosis not present

## 2022-03-20 DIAGNOSIS — Z124 Encounter for screening for malignant neoplasm of cervix: Secondary | ICD-10-CM

## 2022-03-20 DIAGNOSIS — Z01419 Encounter for gynecological examination (general) (routine) without abnormal findings: Secondary | ICD-10-CM

## 2022-03-20 LAB — POCT WET PREP WITH KOH
Clue Cells Wet Prep HPF POC: NEGATIVE
KOH Prep POC: NEGATIVE
Trichomonas, UA: NEGATIVE
Yeast Wet Prep HPF POC: NEGATIVE

## 2022-03-20 MED ORDER — CLOTRIMAZOLE-BETAMETHASONE 1-0.05 % EX CREA: TOPICAL_CREAM | CUTANEOUS | 0 refills | Status: DC

## 2022-03-20 NOTE — Patient Instructions (Addendum)
I value your feedback and you entrusting us with your care. If you get a [facility] patient survey, I would appreciate you taking the time to let us know about your experience today. Thank you!  [facility]: [phone]      

## 2022-03-21 LAB — HEMOGLOBIN A1C
Est. average glucose Bld gHb Est-mCnc: 134 mg/dL
Hgb A1c MFr Bld: 6.3 % — ABNORMAL HIGH (ref 4.8–5.6)

## 2022-03-22 ENCOUNTER — Ambulatory Visit: Payer: 59 | Admitting: Physical Therapy

## 2022-03-22 ENCOUNTER — Telehealth: Payer: Self-pay | Admitting: Gastroenterology

## 2022-03-22 NOTE — Telephone Encounter (Signed)
Pt returned call to schedule colonoscopy call-back [phone]

## 2022-03-23 ENCOUNTER — Telehealth: Payer: Self-pay | Admitting: *Deleted

## 2022-03-23 ENCOUNTER — Other Ambulatory Visit: Payer: Self-pay | Admitting: *Deleted

## 2022-03-23 DIAGNOSIS — Z1211 Encounter for screening for malignant neoplasm of colon: Secondary | ICD-10-CM

## 2022-03-23 MED ORDER — NA SULFATE-K SULFATE-MG SULF 17.5-3.13-1.6 GM/177ML PO SOLN: 1.0000 | Freq: Once | ORAL | 0 refills | Status: AC

## 2022-03-23 NOTE — Telephone Encounter (Signed)
Gastroenterology Pre-Procedure Review  Request Date: 04/16/2022 Requesting Physician: Dr. Marius Ditch  PATIENT REVIEW QUESTIONS: The patient responded to the following health history questions as indicated:    1. Are you having any GI issues? no 2. Do you have a personal history of Polyps? no 3. Do you have a family history of Colon Cancer or Polyps? no 4. Diabetes Mellitus? no 5. Joint replacements in the past 12 months?no 6. Major health problems in the past 3 months?no 7. Any artificial heart valves, MVP, or defibrillator?no    MEDICATIONS & ALLERGIES:    Patient reports the following regarding taking any anticoagulation/antiplatelet therapy:   Plavix, Coumadin, Eliquis, Xarelto, Lovenox, Pradaxa, Brilinta, or Effient? no Aspirin? no  Patient confirms/reports the following medications:  Current Outpatient Medications  Medication Sig Dispense Refill   Na Sulfate-K Sulfate-Mg Sulf 17.5-3.13-1.6 GM/177ML SOLN Take 1 kit by mouth once for 1 dose. 354 mL 0   amLODipine (NORVASC) 10 MG tablet TAKE 1 TABLET(10 MG) BY MOUTH DAILY 90 tablet 0   cholecalciferol (VITAMIN D) 1000 UNITS tablet Take 1,000 Units by mouth daily.     clotrimazole-betamethasone (LOTRISONE) cream Apply externally BID prn sx up to 2 wks 15 g 0   meloxicam (MOBIC) 15 MG tablet Take 1 tablet (15 mg total) by mouth daily. 30 tablet 2   methocarbamol (ROBAXIN) 500 MG tablet Take 1 tablet (500 mg total) by mouth every 8 (eight) hours as needed for muscle spasms. 60 tablet 1   Multiple Vitamin (MULTIVITAMIN) tablet Take 1 tablet by mouth daily.     traZODone (DESYREL) 50 MG tablet TAKE 1/2 TO 1 TABLET(25 TO 50 MG) BY MOUTH AT BEDTIME AS NEEDED FOR SLEEP 30 tablet 3   No current facility-administered medications for this visit.    Patient confirms/reports the following allergies:  Allergies  Allergen Reactions   Lamotrigine Other (See Comments)    tremors    Codeine Rash and Itching    No orders of the defined types were  placed in this encounter.   AUTHORIZATION INFORMATION Primary Insurance: 1D#: Group #:  Secondary Insurance: 1D#: Group #:  SCHEDULE INFORMATION: Date: 04/16/2022 Time: Location:  [place]

## 2022-03-23 NOTE — Telephone Encounter (Signed)
Colonoscopy schedule 04/16/2022

## 2022-04-09 ENCOUNTER — Encounter: Payer: Self-pay | Admitting: Obstetrics and Gynecology

## 2022-04-10 ENCOUNTER — Ambulatory Visit
Admission: RE | Admit: 2022-04-10 | Discharge: 2022-04-10 | Disposition: A | Discharge status: Home / Self Care | Payer: 59 | Source: Ambulatory Visit | Attending: Obstetrics and Gynecology | Admitting: Obstetrics and Gynecology

## 2022-04-10 DIAGNOSIS — Z803 Family history of malignant neoplasm of breast: Secondary | ICD-10-CM | POA: Diagnosis present

## 2022-04-10 DIAGNOSIS — Z1231 Encounter for screening mammogram for malignant neoplasm of breast: Secondary | ICD-10-CM | POA: Diagnosis present

## 2022-04-12 ENCOUNTER — Other Ambulatory Visit: Payer: Self-pay | Admitting: Obstetrics and Gynecology

## 2022-04-12 DIAGNOSIS — N63 Unspecified lump in unspecified breast: Secondary | ICD-10-CM

## 2022-04-12 DIAGNOSIS — R928 Other abnormal and inconclusive findings on diagnostic imaging of breast: Secondary | ICD-10-CM

## 2022-04-13 ENCOUNTER — Encounter: Payer: Self-pay | Admitting: Gastroenterology

## 2022-04-16 ENCOUNTER — Ambulatory Visit: Payer: 59 | Admitting: Certified Registered"

## 2022-04-16 ENCOUNTER — Ambulatory Visit
Admission: RE | Admit: 2022-04-16 | Discharge: 2022-04-16 | Disposition: A | Discharge status: Home / Self Care | Payer: 59 | Attending: Gastroenterology | Admitting: Gastroenterology

## 2022-04-16 ENCOUNTER — Encounter: Payer: Self-pay | Admitting: Gastroenterology

## 2022-04-16 ENCOUNTER — Encounter
Admission: RE | Disposition: A | Discharge status: Home / Self Care | Payer: Self-pay | Source: Home / Self Care | Attending: Gastroenterology

## 2022-04-16 DIAGNOSIS — I10 Essential (primary) hypertension: Secondary | ICD-10-CM | POA: Diagnosis not present

## 2022-04-16 DIAGNOSIS — F419 Anxiety disorder, unspecified: Secondary | ICD-10-CM | POA: Diagnosis not present

## 2022-04-16 DIAGNOSIS — F32A Depression, unspecified: Secondary | ICD-10-CM | POA: Insufficient documentation

## 2022-04-16 DIAGNOSIS — K589 Irritable bowel syndrome without diarrhea: Secondary | ICD-10-CM | POA: Diagnosis not present

## 2022-04-16 DIAGNOSIS — Z79899 Other long term (current) drug therapy: Secondary | ICD-10-CM | POA: Diagnosis not present

## 2022-04-16 DIAGNOSIS — Z539 Procedure and treatment not carried out, unspecified reason: Secondary | ICD-10-CM | POA: Diagnosis not present

## 2022-04-16 DIAGNOSIS — Z1211 Encounter for screening for malignant neoplasm of colon: Secondary | ICD-10-CM

## 2022-04-16 DIAGNOSIS — Z09 Encounter for follow-up examination after completed treatment for conditions other than malignant neoplasm: Secondary | ICD-10-CM | POA: Diagnosis not present

## 2022-04-16 DIAGNOSIS — Z87891 Personal history of nicotine dependence: Secondary | ICD-10-CM | POA: Insufficient documentation

## 2022-04-16 HISTORY — PX: COLONOSCOPY WITH PROPOFOL: SHX5780

## 2022-04-16 SURGERY — COLONOSCOPY WITH PROPOFOL
Anesthesia: General

## 2022-04-16 MED ORDER — SODIUM CHLORIDE 0.9 % IV SOLN: INTRAVENOUS | Status: DC

## 2022-04-16 MED ORDER — FLEET ENEMA 7-19 GM/118ML RE ENEM
1.0000 | ENEMA | Freq: Once | RECTAL | Status: AC
  Administered 2022-04-16: 1 via RECTAL
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
## 2022-04-16 NOTE — Progress Notes (Signed)
Patient did not tolerate the second portion of her colon prep, experienced vomiting x 1.  Patient stated that she had a couple of bowel movements, but is unsure if she is 'clean enough' for the procedure.  Dr Marius Ditch spoke with the patient and asked for a Fleets enema to be given and patient did agree.  After the enema was given, the return from the enema there was solid stool present.  Dr Marius Ditch again spoke with the patient and it was decided to reschedule the Colonoscopy for another date.  The patient is in agreement with the plan and the office will call the patient with a new date and colon prep.

## 2022-04-18 ENCOUNTER — Ambulatory Visit
Admission: RE | Admit: 2022-04-18 | Discharge: 2022-04-18 | Disposition: A | Discharge status: Home / Self Care | Payer: 59 | Source: Ambulatory Visit | Attending: Obstetrics and Gynecology | Admitting: Obstetrics and Gynecology

## 2022-04-18 DIAGNOSIS — N63 Unspecified lump in unspecified breast: Secondary | ICD-10-CM | POA: Insufficient documentation

## 2022-04-18 DIAGNOSIS — R928 Other abnormal and inconclusive findings on diagnostic imaging of breast: Secondary | ICD-10-CM | POA: Insufficient documentation

## 2022-04-19 ENCOUNTER — Telehealth: Payer: Self-pay

## 2022-04-19 ENCOUNTER — Encounter: Payer: Self-pay | Admitting: Obstetrics and Gynecology

## 2022-04-19 NOTE — Telephone Encounter (Signed)
Pt aware of neg MyRisk results. IBIS=18%/riskscore=16.4%. No extra screening recommendations for pt. Cont monthly SBE, yearly CBE and mammos.   Patient understands these results only apply to her and her children, and this is not indicative of genetic testing results of her other family members. It is recommended that her other family members have genetic testing done.  Pt also understands negative genetic testing doesn't mean she will never get any of these cancers.   Hard copy mailed to pt. F/u prn.

## 2022-04-19 NOTE — Telephone Encounter (Signed)
Contacted patient to reschedule her colonoscopy with Dr. Marius Ditch with a 2 day prep.  Left voice message for her to call back to schedule. She will need a rx for Zofran sent to her pharmacy. Zofran 4 mg every 6-8 hours as needed for nausea, that she should take before prep. Qty of 10 verbalized by Dr. Marius Ditch.  Thanks, [place], [place]

## 2022-04-19 NOTE — Telephone Encounter (Signed)
Patient returning Elmo Putt Copland's call about genetic testing results. Advised Elmo Putt is with patients right now. She will return call as she is able. Patient states she is not available after 1:30.

## 2022-04-19 NOTE — Telephone Encounter (Signed)
-----   Message from Lin Landsman, MD sent at 04/16/2022  9:45 AM EST ----- Regarding: Reschedule colonoscopy Kaitlyn Solomon  She was not clean.  I did not do her procedure today.  Please reschedule next available and with 2-day prep.  Also, send in Zofran 4 mg every 6-8 hours as needed for nausea, that she should take before prep  Rohini Vanga

## 2022-05-13 ENCOUNTER — Other Ambulatory Visit: Payer: Self-pay | Admitting: Family Medicine

## 2022-05-13 DIAGNOSIS — I10 Essential (primary) hypertension: Secondary | ICD-10-CM

## 2022-05-28 ENCOUNTER — Encounter: Payer: Self-pay | Admitting: *Deleted

## 2022-07-20 ENCOUNTER — Other Ambulatory Visit: Payer: Self-pay | Admitting: Family Medicine

## 2022-07-20 DIAGNOSIS — E039 Hypothyroidism, unspecified: Secondary | ICD-10-CM

## 2022-07-20 NOTE — Telephone Encounter (Signed)
Sending medication to the pharmacy. Patient needs a follow up visit. She missed her last appointment

## 2022-08-14 ENCOUNTER — Emergency Department
Admission: EM | Admit: 2022-08-14 | Discharge: 2022-08-14 | Disposition: A | Discharge status: Home / Self Care | Payer: 59 | Attending: Emergency Medicine | Admitting: Emergency Medicine

## 2022-08-14 ENCOUNTER — Other Ambulatory Visit: Payer: Self-pay

## 2022-08-14 ENCOUNTER — Emergency Department: Payer: 59

## 2022-08-14 ENCOUNTER — Encounter: Payer: Self-pay | Admitting: Emergency Medicine

## 2022-08-14 DIAGNOSIS — R600 Localized edema: Secondary | ICD-10-CM | POA: Diagnosis not present

## 2022-08-14 DIAGNOSIS — M79604 Pain in right leg: Secondary | ICD-10-CM | POA: Diagnosis present

## 2022-08-14 MED ORDER — ETODOLAC 400 MG PO TABS: 400.0000 mg | ORAL_TABLET | Freq: Two times a day (BID) | ORAL | 0 refills | Status: DC
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
## 2022-08-14 NOTE — ED Notes (Signed)
See triage note  Presents with swelling and some pain to right leg  Denies any injury Ambulates well to treatment room

## 2022-08-14 NOTE — ED Triage Notes (Signed)
Pt here with right leg pain that started Sun. Pt denies fall or injury. Pt states she noticed her leg was more swollen around her ankle and becomes more painful when rubbing it. Pt ambulatory to triage.

## 2022-08-14 NOTE — Discharge Instructions (Addendum)
Follow-up with your primary care provider if any continued problems or concerns.  Begin taking etodolac 400 mg twice daily with food.  Increase fluids.  Decrease the amount of sodium in your foods to decrease swelling in your leg.  Elevate leg whenever possible.  Return to the emergency department if any severe worsening of your symptoms over the holiday weekend.

## 2022-09-06 ENCOUNTER — Other Ambulatory Visit: Payer: Self-pay | Admitting: Family Medicine

## 2022-10-16 ENCOUNTER — Encounter: Payer: Self-pay | Admitting: Emergency Medicine

## 2022-10-16 ENCOUNTER — Emergency Department
Admission: EM | Admit: 2022-10-16 | Discharge: 2022-10-16 | Disposition: A | Discharge status: Home / Self Care | Payer: 59 | Attending: Emergency Medicine | Admitting: Emergency Medicine

## 2022-10-16 ENCOUNTER — Other Ambulatory Visit: Payer: Self-pay

## 2022-10-16 DIAGNOSIS — R6883 Chills (without fever): Secondary | ICD-10-CM | POA: Diagnosis present

## 2022-10-16 DIAGNOSIS — U071 COVID-19: Secondary | ICD-10-CM | POA: Diagnosis not present

## 2022-10-16 DIAGNOSIS — I10 Essential (primary) hypertension: Secondary | ICD-10-CM | POA: Insufficient documentation

## 2022-10-16 LAB — RESP PANEL BY RT-PCR (RSV, FLU A&B, COVID)  RVPGX2
Influenza A by PCR: NEGATIVE
Influenza B by PCR: NEGATIVE
Resp Syncytial Virus by PCR: NEGATIVE
SARS Coronavirus 2 by RT PCR: POSITIVE — AB

## 2022-10-16 MED ORDER — ONDANSETRON 4 MG PO TBDP: 4.0000 mg | ORAL_TABLET | Freq: Three times a day (TID) | ORAL | 0 refills | Status: DC | PRN

## 2022-10-16 MED ORDER — BENZONATATE 100 MG PO CAPS: ORAL_CAPSULE | ORAL | 0 refills | Status: DC

## 2022-10-16 MED ORDER — PREDNISONE 20 MG PO TABS: 40.0000 mg | ORAL_TABLET | Freq: Every day | ORAL | 0 refills | Status: AC

## 2022-10-16 NOTE — Discharge Instructions (Addendum)
Take the prescription meds as directed. Consider OTC Delsym cough syrup for additional cough relief. Rest and hydrate. Quarantine as appropriate for the next 5 days.

## 2022-10-16 NOTE — ED Triage Notes (Signed)
Patient to ED via POV for chills, fever, body aches, and cough. States symptoms started this AM.

## 2022-10-16 NOTE — ED Provider Notes (Signed)
[facility] Emergency Department Provider Note     Event Date/Time   First MD Initiated Contact with Patient 10/16/22 1631     (approximate)   History   Chills   HPI  Kaitlyn Solomon is a 57 y.o. female with a history of anemia, anxiety, HTN, IBS, bipolar disorder, presents to the ED for evaluation of cold symptoms.  Patient reports onset of symptoms this morning.  She would endorse subjective fevers, body aches, and chills patient also reports nonproductive cough.     Physical Exam   Triage Vital Signs: ED Triage Vitals  Encounter Vitals Group     BP 10/16/22 1601 (!) 134/91     Systolic BP Percentile --      Diastolic BP Percentile --      Pulse Rate 10/16/22 1601 (!) 102     Resp 10/16/22 1601 18     Temp 10/16/22 1601 99.8 F (37.7 C)     Temp Source 10/16/22 1601 Oral     SpO2 10/16/22 1601 96 %     Weight 10/16/22 1602 165 lb (74.8 kg)     Height 10/16/22 1602 5\' 2"  (1.575 m)     Head Circumference --      Peak Flow --      Pain Score 10/16/22 1602 8     Pain Loc --      Pain Education --      Exclude from Growth Chart --     Most recent vital signs: Vitals:   10/16/22 1601  BP: (!) 134/91  Pulse: (!) 102  Resp: 18  Temp: 99.8 F (37.7 C)  SpO2: 96%    General Awake, no distress. NAD HEENT NCAT. PERRL. EOMI. No rhinorrhea. Mucous membranes are moist.  CV:  Good peripheral perfusion.  RESP:  Normal effort. CTA ABD:  No distention.    ED Results / Procedures / Treatments   Labs (all labs ordered are listed, but only abnormal results are displayed) Labs Reviewed  RESP PANEL BY RT-PCR (RSV, FLU A&B, COVID)  RVPGX2 - Abnormal; Notable for the following components:      Result Value   SARS Coronavirus 2 by RT PCR POSITIVE (*)    All other components within normal limits     EKG   RADIOLOGY  No results found.   PROCEDURES:  Critical Care performed: No  Procedures   MEDICATIONS ORDERED IN  ED: Medications - No data to display   IMPRESSION / MDM / ASSESSMENT AND PLAN / ED COURSE  I reviewed the triage vital signs and the nursing notes.                              Differential diagnosis includes, but is not limited to, COVID, flu, RSV, AOM, strep pharyngitis  Patient's presentation is most consistent with acute complicated illness / injury requiring diagnostic workup.  Patient's diagnosis is consistent with COVID-19.  Patient will be discharged home with prescriptions for prednisone, Tessalon Perles, and Zofran.  Patient is to follow up with her primary provider as needed or otherwise directed. Patient is given ED precautions to return to the ED for any worsening or new symptoms.   FINAL CLINICAL IMPRESSION(S) / ED DIAGNOSES   Final diagnoses:  COVID-19     Rx / DC Orders   ED Discharge Orders          Ordered    predniSONE (  DELTASONE) 20 MG tablet  Daily with breakfast        10/16/22 1711    benzonatate (TESSALON PERLES) 100 MG capsule        10/16/22 1711    ondansetron (ZOFRAN-ODT) 4 MG disintegrating tablet  Every 8 hours PRN        10/16/22 1711             Note:  This document was prepared using Dragon voice recognition software and may include unintentional dictation errors.    Lissa Hoard, PA-C 10/16/22 2309    Minna Antis, MD 10/17/22 2310

## 2022-11-27 ENCOUNTER — Encounter: Payer: Self-pay | Admitting: Emergency Medicine

## 2022-11-27 ENCOUNTER — Other Ambulatory Visit: Payer: Self-pay

## 2022-11-27 ENCOUNTER — Emergency Department: Payer: 59

## 2022-11-27 ENCOUNTER — Emergency Department
Admission: EM | Admit: 2022-11-27 | Discharge: 2022-11-27 | Disposition: A | Discharge status: Home / Self Care | Payer: 59 | Attending: Emergency Medicine | Admitting: Emergency Medicine

## 2022-11-27 DIAGNOSIS — S9001XA Contusion of right ankle, initial encounter: Secondary | ICD-10-CM | POA: Insufficient documentation

## 2022-11-27 DIAGNOSIS — Z79899 Other long term (current) drug therapy: Secondary | ICD-10-CM | POA: Diagnosis not present

## 2022-11-27 DIAGNOSIS — S99911A Unspecified injury of right ankle, initial encounter: Secondary | ICD-10-CM | POA: Diagnosis present

## 2022-11-27 DIAGNOSIS — W228XXA Striking against or struck by other objects, initial encounter: Secondary | ICD-10-CM | POA: Diagnosis not present

## 2022-11-27 DIAGNOSIS — I10 Essential (primary) hypertension: Secondary | ICD-10-CM | POA: Insufficient documentation

## 2022-11-27 DIAGNOSIS — M25571 Pain in right ankle and joints of right foot: Secondary | ICD-10-CM

## 2022-11-27 MED ORDER — IBUPROFEN 600 MG PO TABS: 600.0000 mg | ORAL_TABLET | Freq: Three times a day (TID) | ORAL | 0 refills | Status: DC | PRN

## 2022-11-27 MED ORDER — IBUPROFEN 600 MG PO TABS
600.0000 mg | ORAL_TABLET | Freq: Once | ORAL | Status: AC
  Administered 2022-11-27: 600 mg via ORAL
  Filled 2022-11-27: qty 1

## 2022-11-27 MED ORDER — HYDROCODONE-ACETAMINOPHEN 5-325 MG PO TABS: 1.0000 | ORAL_TABLET | Freq: Four times a day (QID) | ORAL | 0 refills | Status: DC | PRN

## 2022-11-27 NOTE — Discharge Instructions (Signed)
You may take pain medicines as needed.  Use stirrup splint and your crutches as needed for comfort.  Return to the ER for worsening symptoms or other concerns.
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
## 2022-11-27 NOTE — ED Triage Notes (Signed)
Pt to triage via w/c with no distress noted; st this evening at work, pulled a piece of equipment up onto her foot; c/o persistent rt ankle pain; pt employed with [employer] ([address], [place])--inst pt to f/u with employer regarding workers comp requirements

## 2023-03-11 ENCOUNTER — Other Ambulatory Visit: Payer: Self-pay

## 2023-03-11 ENCOUNTER — Emergency Department: Payer: Self-pay

## 2023-03-11 ENCOUNTER — Emergency Department
Admission: EM | Admit: 2023-03-11 | Discharge: 2023-03-12 | Disposition: A | Discharge status: Home / Self Care | Payer: Self-pay | Attending: Emergency Medicine | Admitting: Emergency Medicine

## 2023-03-11 DIAGNOSIS — Z1152 Encounter for screening for COVID-19: Secondary | ICD-10-CM | POA: Insufficient documentation

## 2023-03-11 DIAGNOSIS — N390 Urinary tract infection, site not specified: Secondary | ICD-10-CM | POA: Insufficient documentation

## 2023-03-11 DIAGNOSIS — Z79899 Other long term (current) drug therapy: Secondary | ICD-10-CM | POA: Insufficient documentation

## 2023-03-11 DIAGNOSIS — R4182 Altered mental status, unspecified: Secondary | ICD-10-CM | POA: Insufficient documentation

## 2023-03-11 DIAGNOSIS — I10 Essential (primary) hypertension: Secondary | ICD-10-CM | POA: Insufficient documentation

## 2023-03-11 NOTE — ED Triage Notes (Signed)
Pt arrives via EMS for AMS from home. Pts daughter said pt is altered compared to her baseline. LKW is past 24 hours. Pt ANOx3 and ambulatory on arrival. Stroke screen negative for EMS. Hx of brain bleed in past.   EMS vitals: 170/100 75 HR 15 RR 95% SPO2 on RA 91n CBG
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
## 2023-03-12 LAB — CBC WITH DIFFERENTIAL/PLATELET
Abs Immature Granulocytes: 0.02 10*3/uL (ref 0.00–0.07)
Basophils Absolute: 0.1 10*3/uL (ref 0.0–0.1)
Basophils Relative: 1 %
Eosinophils Absolute: 0.2 10*3/uL (ref 0.0–0.5)
Eosinophils Relative: 2 %
HCT: 44.2 % (ref 36.0–46.0)
Hemoglobin: 13.1 g/dL (ref 12.0–15.0)
Immature Granulocytes: 0 %
Lymphocytes Relative: 33 %
Lymphs Abs: 2.6 10*3/uL (ref 0.7–4.0)
MCH: 20.2 pg — ABNORMAL LOW (ref 26.0–34.0)
MCHC: 29.6 g/dL — ABNORMAL LOW (ref 30.0–36.0)
MCV: 68.3 fL — ABNORMAL LOW (ref 80.0–100.0)
Monocytes Absolute: 0.6 10*3/uL (ref 0.1–1.0)
Monocytes Relative: 7 %
Neutro Abs: 4.4 10*3/uL (ref 1.7–7.7)
Neutrophils Relative %: 57 %
Platelets: 440 10*3/uL — ABNORMAL HIGH (ref 150–400)
RBC: 6.47 MIL/uL — ABNORMAL HIGH (ref 3.87–5.11)
RDW: 19.7 % — ABNORMAL HIGH (ref 11.5–15.5)
Smear Review: NORMAL
WBC: 7.8 10*3/uL (ref 4.0–10.5)
nRBC: 0 % (ref 0.0–0.2)

## 2023-03-12 LAB — COMPREHENSIVE METABOLIC PANEL
ALT: 20 U/L (ref 0–44)
AST: 21 U/L (ref 15–41)
Albumin: 4.4 g/dL (ref 3.5–5.0)
Alkaline Phosphatase: 48 U/L (ref 38–126)
Anion gap: 11 (ref 5–15)
BUN: 17 mg/dL (ref 6–20)
CO2: 27 mmol/L (ref 22–32)
Calcium: 9 mg/dL (ref 8.9–10.3)
Chloride: 102 mmol/L (ref 98–111)
Creatinine, Ser: 0.82 mg/dL (ref 0.44–1.00)
GFR, Estimated: >60 mL/min (ref >60)
Glucose, Bld: 95 mg/dL (ref 70–99)
Potassium: 3.7 mmol/L (ref 3.5–5.1)
Sodium: 140 mmol/L (ref 135–145)
Total Bilirubin: 0.6 mg/dL (ref 0.0–1.2)
Total Protein: 7.7 g/dL (ref 6.5–8.1)

## 2023-03-12 LAB — RESP PANEL BY RT-PCR (RSV, FLU A&B, COVID)  RVPGX2
Influenza A by PCR: NEGATIVE
Influenza B by PCR: NEGATIVE
Resp Syncytial Virus by PCR: NEGATIVE
SARS Coronavirus 2 by RT PCR: NEGATIVE

## 2023-03-12 LAB — URINALYSIS, ROUTINE W REFLEX MICROSCOPIC
Bilirubin Urine: NEGATIVE
Glucose, UA: NEGATIVE mg/dL
Hgb urine dipstick: NEGATIVE
Ketones, ur: NEGATIVE mg/dL
Nitrite: NEGATIVE
Protein, ur: NEGATIVE mg/dL
Specific Gravity, Urine: 1.006 (ref 1.005–1.030)
Squamous Epithelial / HPF: 0 /[HPF] (ref 0–5)
pH: 7 (ref 5.0–8.0)

## 2023-03-12 LAB — TROPONIN I (HIGH SENSITIVITY)
Troponin I (High Sensitivity): 5 ng/L (ref <18)
Troponin I (High Sensitivity): 6 ng/L (ref <18)

## 2023-03-12 MED ORDER — KETOROLAC TROMETHAMINE 30 MG/ML IJ SOLN
10.0000 mg | Freq: Once | INTRAMUSCULAR | Status: AC
  Administered 2023-03-12: 9.9 mg via INTRAVENOUS
  Filled 2023-03-12: qty 1

## 2023-03-12 MED ORDER — FOSFOMYCIN TROMETHAMINE 3 G PO PACK
3.0000 g | PACK | Freq: Once | ORAL | Status: AC
  Administered 2023-03-12: 3 g via ORAL
  Filled 2023-03-12: qty 3

## 2023-03-12 NOTE — Discharge Instructions (Addendum)
Return to the ER for recurrent or worsening symptoms, persistent vomiting, difficulty breathing, lethargy, slurred speech, confusion or other concerns

## 2023-05-08 ENCOUNTER — Other Ambulatory Visit: Payer: Self-pay | Admitting: Physician Assistant

## 2023-05-08 DIAGNOSIS — Z1231 Encounter for screening mammogram for malignant neoplasm of breast: Secondary | ICD-10-CM

## 2023-05-22 ENCOUNTER — Ambulatory Visit
Admission: RE | Admit: 2023-05-22 | Discharge: 2023-05-22 | Disposition: A | Discharge status: Home / Self Care | Payer: Self-pay | Source: Ambulatory Visit | Attending: Physician Assistant | Admitting: Physician Assistant

## 2023-05-22 DIAGNOSIS — Z1231 Encounter for screening mammogram for malignant neoplasm of breast: Secondary | ICD-10-CM | POA: Diagnosis present

## 2023-05-28 ENCOUNTER — Other Ambulatory Visit: Payer: Self-pay | Admitting: Physician Assistant

## 2023-05-28 DIAGNOSIS — R928 Other abnormal and inconclusive findings on diagnostic imaging of breast: Secondary | ICD-10-CM

## 2023-05-30 ENCOUNTER — Ambulatory Visit
Admission: RE | Admit: 2023-05-30 | Discharge: 2023-05-30 | Disposition: A | Discharge status: Home / Self Care | Source: Ambulatory Visit | Attending: Physician Assistant | Admitting: Physician Assistant

## 2023-05-30 DIAGNOSIS — R928 Other abnormal and inconclusive findings on diagnostic imaging of breast: Secondary | ICD-10-CM

## 2023-06-11 ENCOUNTER — Other Ambulatory Visit: Payer: Self-pay | Admitting: Family Medicine

## 2023-06-11 DIAGNOSIS — I10 Essential (primary) hypertension: Secondary | ICD-10-CM

## 2023-06-12 ENCOUNTER — Inpatient Hospital Stay: Attending: Oncology | Admitting: Oncology

## 2023-06-12 ENCOUNTER — Encounter: Payer: Self-pay | Admitting: Oncology

## 2023-06-12 ENCOUNTER — Inpatient Hospital Stay

## 2023-06-12 VITALS — BP 135/89 | HR 76 | Temp 97.5°F | Resp 19 | Wt 152.1 lb

## 2023-06-12 DIAGNOSIS — F319 Bipolar disorder, unspecified: Secondary | ICD-10-CM | POA: Insufficient documentation

## 2023-06-12 DIAGNOSIS — D75839 Thrombocytosis, unspecified: Secondary | ICD-10-CM | POA: Diagnosis not present

## 2023-06-12 DIAGNOSIS — K068 Other specified disorders of gingiva and edentulous alveolar ridge: Secondary | ICD-10-CM | POA: Diagnosis not present

## 2023-06-12 DIAGNOSIS — R718 Other abnormality of red blood cells: Secondary | ICD-10-CM

## 2023-06-12 DIAGNOSIS — D751 Secondary polycythemia: Secondary | ICD-10-CM | POA: Insufficient documentation

## 2023-06-12 DIAGNOSIS — D471 Chronic myeloproliferative disease: Secondary | ICD-10-CM | POA: Insufficient documentation

## 2023-06-12 DIAGNOSIS — I10 Essential (primary) hypertension: Secondary | ICD-10-CM | POA: Diagnosis not present

## 2023-06-12 DIAGNOSIS — Z0000 Encounter for general adult medical examination without abnormal findings: Secondary | ICD-10-CM

## 2023-06-12 DIAGNOSIS — E039 Hypothyroidism, unspecified: Secondary | ICD-10-CM | POA: Insufficient documentation

## 2023-06-12 LAB — CBC WITH DIFFERENTIAL (CANCER CENTER ONLY)
Abs Immature Granulocytes: 0.02 10*3/uL (ref 0.00–0.07)
Basophils Absolute: 0.1 10*3/uL (ref 0.0–0.1)
Basophils Relative: 1 %
Eosinophils Absolute: 0.2 10*3/uL (ref 0.0–0.5)
Eosinophils Relative: 2 %
HCT: 44.8 % (ref 36.0–46.0)
Hemoglobin: 13.4 g/dL (ref 12.0–15.0)
Immature Granulocytes: 0 %
Lymphocytes Relative: 45 %
Lymphs Abs: 3.8 10*3/uL (ref 0.7–4.0)
MCH: 20 pg — ABNORMAL LOW (ref 26.0–34.0)
MCHC: 29.9 g/dL — ABNORMAL LOW (ref 30.0–36.0)
MCV: 66.8 fL — ABNORMAL LOW (ref 80.0–100.0)
Monocytes Absolute: 0.6 10*3/uL (ref 0.1–1.0)
Monocytes Relative: 8 %
Neutro Abs: 3.7 10*3/uL (ref 1.7–7.7)
Neutrophils Relative %: 44 %
Platelet Count: 488 10*3/uL — ABNORMAL HIGH (ref 150–400)
RBC: 6.71 MIL/uL — ABNORMAL HIGH (ref 3.87–5.11)
RDW: 19.3 % — ABNORMAL HIGH (ref 11.5–15.5)
WBC Count: 8.4 10*3/uL (ref 4.0–10.5)
nRBC: 0 % (ref 0.0–0.2)

## 2023-06-12 LAB — CMP (CANCER CENTER ONLY)
ALT: 14 U/L (ref 0–44)
AST: 17 U/L (ref 15–41)
Albumin: 4.9 g/dL (ref 3.5–5.0)
Alkaline Phosphatase: 60 U/L (ref 38–126)
Anion gap: 7 (ref 5–15)
BUN: 19 mg/dL (ref 6–20)
CO2: 29 mmol/L (ref 22–32)
Calcium: 10 mg/dL (ref 8.9–10.3)
Chloride: 103 mmol/L (ref 98–111)
Creatinine: 0.96 mg/dL (ref 0.44–1.00)
GFR, Estimated: >60 mL/min (ref >60)
Glucose, Bld: 90 mg/dL (ref 70–99)
Potassium: 3.6 mmol/L (ref 3.5–5.1)
Sodium: 139 mmol/L (ref 135–145)
Total Bilirubin: 0.5 mg/dL (ref 0.0–1.2)
Total Protein: 7.9 g/dL (ref 6.5–8.1)

## 2023-06-12 LAB — SEDIMENTATION RATE: Sed Rate: 2 mm/h (ref 0–22)

## 2023-06-12 LAB — IRON AND IRON BINDING CAPACITY (CC-WL,HP ONLY)
Iron: 58 ug/dL (ref 28–170)
Saturation Ratios: 16 % (ref 10.4–31.8)
TIBC: 374 ug/dL (ref 250–450)
UIBC: 316 ug/dL (ref 148–442)

## 2023-06-12 LAB — LACTATE DEHYDROGENASE: LDH: 242 U/L — ABNORMAL HIGH (ref 98–192)

## 2023-06-12 LAB — C-REACTIVE PROTEIN: CRP: 0.5 mg/dL (ref <1.0)

## 2023-06-12 LAB — FERRITIN: Ferritin: 119 ng/mL (ref 11–307)

## 2023-06-12 NOTE — Assessment & Plan Note (Signed)
 She is due for a colonoscopy as it has been more than five years since her last screening. Mammogram is up to date. - Recommend scheduling a colonoscopy to stay current with screening guidelines

## 2023-06-12 NOTE — Assessment & Plan Note (Addendum)
 Microcytosis with small red blood cell size and slightly elevated total red blood cell count, but normal hemoglobin levels. Differential diagnosis includes iron deficiency, thalassemia, or sickle cell trait. No family history of sickle cell disease. Further testing needed to confirm the cause.  -Iron studies show no evidence of iron deficiency.  Hence we proceeded with hemoglobin electrophoresis and alpha thalassemia genotype testing  - Arrange a follow-up phone call in two weeks to discuss test results - Plan to see her in four months for repeat labs
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
## 2023-06-12 NOTE — Assessment & Plan Note (Addendum)
 Thrombocytosis with a platelet count of 500,000, exceeding the normal range of 150,000 to 450,000. Differential diagnosis includes iron deficiency, inflammation, or a bone marrow mutation causing overproduction of platelets. Occasional gum bleeding noted. No splenectomy history. Further investigation required to determine the underlying cause.  Repeat labs today showed platelet count of 488,000, remains elevated.  White count 8400 with normal differential.  Hemoglobin normal at 13.4, MCV decreased at 66.8.  Iron studies show no evidence of iron deficiency.  Sed rate normal at 2 mm/h.  CRP pending.  We will check for JAK2 mutation analysis with reflex testing to include CALR, MPL, exon 12-15 mutations to rule out myeloproliferative neoplasm.  - Arrange a follow-up phone call in two weeks to discuss test results - Plan to see Kaitlyn Solomon in four months for repeat labs

## 2023-06-16 LAB — HGB FRACTIONATION CASCADE
Hgb A2: 2.3 % (ref 1.8–3.2)
Hgb A: 97.7 % (ref 96.4–98.8)
Hgb F: 0 % (ref 0.0–2.0)
Hgb S: 0 %

## 2023-06-20 LAB — JAK2 V617F RFX CALR/MPL/E12-15: JAK2 V617F %: 14.52 %

## 2023-06-21 LAB — ALPHA-THALASSEMIA GENOTYPR

## 2023-06-27 ENCOUNTER — Encounter: Payer: Self-pay | Admitting: Oncology

## 2023-06-27 ENCOUNTER — Telehealth: Payer: Self-pay | Admitting: Oncology

## 2023-06-27 ENCOUNTER — Inpatient Hospital Stay: Attending: Oncology | Admitting: Oncology

## 2023-06-27 DIAGNOSIS — Z1589 Genetic susceptibility to other disease: Secondary | ICD-10-CM

## 2023-06-27 DIAGNOSIS — D75839 Thrombocytosis, unspecified: Secondary | ICD-10-CM | POA: Diagnosis not present

## 2023-06-27 DIAGNOSIS — R718 Other abnormality of red blood cells: Secondary | ICD-10-CM | POA: Diagnosis not present

## 2023-06-27 NOTE — Assessment & Plan Note (Signed)
 Microcytosis with small red blood cell size and slightly elevated total red blood cell count, but normal hemoglobin levels.  Since iron studies showed no evidence of iron deficiency, we proceeded with hemoglobin electrophoresis and alpha thalassemia testing.  Hemoglobin electrophoresis showed no evidence of hemoglobinopathy.  However alpha thalassemia testing did not show evidence of homozygosity for -alpha 3.7 deletion, indicating alpha thalassemia trait.

## 2023-06-27 NOTE — Telephone Encounter (Signed)
 Patient scheduled appointments. Patient is aware of all appointment details.

## 2023-06-27 NOTE — Progress Notes (Signed)
 [facility]  HEMATOLOGY-ONCOLOGY ELECTRONIC VISIT PROGRESS NOTE  PATIENT NAME: Kaitlyn Solomon   MR#: [id number] DOB: 03-07-1965  DATE OF SERVICE: 06/27/2023  Patient Care Team: Janifer Meigs as PCP - General (Physician Assistant)  I connected with the patient via telephone conference and verified that I am speaking with the correct person using two identifiers. The patient's location is at home and I am providing care from the [facility].  I discussed the limitations, risks, security and privacy concerns of performing an evaluation and management service by e-visits and the availability of in person appointments. I also discussed with the patient that there may be a patient responsible charge related to this service. The patient expressed understanding and agreed to proceed.   ASSESSMENT & PLAN:   Kaitlyn Solomon is a 58 y.o. lady with a past medical history of hypertension, hypothyroidism, GERD, bipolar disorder, history of subarachnoid hemorrhage in 2019, was referred to our service in April 2025 for evaluation of thrombocytosis and polycythemia.   Thrombocytosis Thrombocytosis with a platelet count of 500,000, exceeding the normal range of 150,000 to 450,000. Differential diagnosis includes iron deficiency, inflammation, or a bone marrow mutation causing overproduction of platelets. Occasional gum bleeding noted. No splenectomy history.   On her initial consultation with us  on 06/12/2023, labs showed platelet count of 488,000, remains elevated.  White count 8400 with normal differential.  Hemoglobin normal at 13.4, MCV decreased at 66.8.  Iron studies showed no evidence of iron deficiency.  Sed rate normal at 2 mm/h.  CRP normal at 0.5 mg/dL.  LDH was increased at 242.  JAK2 V617F mutation was positive.   Clinical picture concerning for primary myeloproliferative neoplasm like ET.  This was explained to the patient.  If confirmed, goal would be to maintain  platelet count below 400,000 with cytoreductive therapy using hydroxyurea.  We will arrange for bone marrow biopsy/aspiration and see her in clinic to discuss results.  Tentatively we will schedule appointment to return in 4 weeks.  RBC microcytosis Microcytosis with small red blood cell size and slightly elevated total red blood cell count, but normal hemoglobin levels.  Since iron studies showed no evidence of iron deficiency, we proceeded with hemoglobin electrophoresis and alpha thalassemia testing.  Hemoglobin electrophoresis showed no evidence of hemoglobinopathy.  However alpha thalassemia testing did not show evidence of homozygosity for -alpha 3.7 deletion, indicating alpha thalassemia trait.   I discussed the assessment and treatment plan with the patient. The patient was provided an opportunity to ask questions and all were answered. The patient agreed with the plan and demonstrated an understanding of the instructions. The patient was advised to call back or seek an in-person evaluation if the symptoms worsen or if the condition fails to improve as anticipated.    I spent 15 minutes over the phone with the patient reviewing test results, discuss management and coordination/planning of care.  Arlo Berber, MD 06/27/2023 2:28 PM [facility] - A DEPT [facility] [address] [place] [address] Dept: [phone] Dept Fax: [phone]   INTERVAL HISTORY:  Please see above for problem oriented charting.  The purpose of today's discussion is to explain recent lab results and to formulate plan of care.  Discussed the use of AI scribe software for clinical note transcription with the patient, who gave verbal consent to proceed.  History of Present Illness Kaitlyn Solomon is a  58 year old female who presents for evaluation of high platelet count. She was referred by her primary care physician for evaluation of  high platelet count.  She has been found to have a high platelet count, prompting further investigation. Blood work revealed a JAK2 mutation, indicating a potential myeloproliferative disorder. Further diagnostic work-up is ongoing to confirm the diagnosis and determine the appropriate management plan.  She carries the alpha thalassemia trait, resulting in microcytic red blood cells, but she does not have anemia. The only manifestation of this trait is the small size of the red cells.  She experiences recurrent vaginal infections, which have not resolved despite consultations with her gynecologist and primary care physician. The underlying cause remains unidentified.    SUMMARY OF HEMATOLOGY HISTORY:  On 02/01/2023, labs at her PCPs office showed hemoglobin of 13.3, hematocrit 44.9, MCV 68.800.  Platelet count was 500,000.  White count 7700 with normal differential.  Red blood cell count was elevated at 6.5 million.  Iron studies were grossly within normal limits.  Thyroid  function tests were normal as well.   On 05/06/2023, repeat labs showed platelet count of 06/21/1998.  Hemoglobin 13.6, hematocrit 46.2, MCV 68.2.  White count 9800 with normal differential.  She was referred to us  for further evaluation of thrombocytosis, microcytosis.   She has a history of heavy menstrual cycles, which led to a partial hysterectomy. She no longer experiences menstrual cycles. She reports occasional gum bleeding but denies any other bleeding issues such as blood in stools, black stools, or epistaxis.   There is no known family history of sickle cell disease, and she has not had any surgeries on her spleen. She does not smoke and consumes alcohol occasionally. No unintentional weight loss. She is due for a colonoscopy, as it has been more than five years since her last screening. She is up to date with her mammogram screenings.   Patient has never been diagnosed with thrombotic events.  Differential diagnosis  includes iron deficiency, inflammation, or a bone marrow mutation causing overproduction of platelets. Occasional gum bleeding noted.   On her initial consultation with us  on 06/12/2023, labs showed platelet count of 488,000, remains elevated.  White count 8400 with normal differential.  Hemoglobin normal at 13.4, MCV decreased at 66.8.  Iron studies showed no evidence of iron deficiency.  Sed rate normal at 2 mm/h.  CRP normal at 0.5 mg/dL.  LDH was increased at 242.  JAK2 V617F mutation was positive.   We will arrange for bone marrow biopsy/aspiration and see her in clinic to discuss results.   Microcytosis with small red blood cell size and slightly elevated total red blood cell count, but normal hemoglobin levels.  Since iron studies showed no evidence of iron deficiency, we proceeded with hemoglobin electrophoresis and alpha thalassemia testing.  Hemoglobin electrophoresis showed no evidence of hemoglobinopathy.  However alpha thalassemia testing did not show evidence of homozygosity for -alpha 3.7 deletion, indicating alpha thalassemia trait.   REVIEW OF SYSTEMS:    Review of Systems - Oncology  All other pertinent systems were reviewed with the patient and are negative.  I have reviewed the past medical history, past surgical history, social history and family history with the patient and they are unchanged from previous note.  ALLERGIES:  She is allergic to lamotrigine and codeine.  MEDICATIONS:  Current Outpatient Medications  Medication Sig Dispense Refill   amLODipine  (NORVASC ) 10 MG tablet TAKE 1 TABLET(10 MG) BY MOUTH DAILY 90 tablet 0  cholecalciferol (VITAMIN D ) 1000 UNITS tablet Take 1,000 Units by mouth daily. (Patient not taking: Reported on 06/12/2023)     fluconazole  (DIFLUCAN ) 150 MG tablet Take 150 mg by mouth daily as needed (Every 72 hrs prn acid reflux per patient).     FLUoxetine (PROZAC) 40 MG capsule Take 40 mg by mouth daily.     levothyroxine  (SYNTHROID ) 75 MCG  tablet TAKE 1 TABLET(75 MCG) BY MOUTH DAILY 90 tablet 0   levothyroxine  (SYNTHROID ) 88 MCG tablet Take 88 mcg by mouth daily.     Melatonin 1 MG CHEW Chew 1 tablet by mouth daily as needed (sleep).     Multiple Vitamin (MULTIVITAMIN) tablet Take 1 tablet by mouth daily.     pantoprazole (PROTONIX) 20 MG tablet Take 20 mg by mouth daily as needed for heartburn or indigestion.     traZODone  (DESYREL ) 50 MG tablet TAKE 1/2 TO 1 TABLET(25 TO 50 MG) BY MOUTH AT BEDTIME AS NEEDED FOR SLEEP (Patient not taking: Reported on 06/12/2023) 30 tablet 3   No current facility-administered medications for this visit.    PHYSICAL EXAMINATION:    Onc Performance Status - 06/27/23 1400       ECOG Perf Status   ECOG Perf Status Fully active, able to carry on all pre-disease performance without restriction      KPS SCALE   KPS % SCORE Able to carry on normal activity, minor s/s of disease             LABORATORY DATA:   I have reviewed the data as listed.  Recent Results (from the past 2160 hours)  JAK2 V617F rfx CALR/MPL/E12-15     Status: None   Collection Time: 06/12/23 12:39 PM  Result Value Ref Range   Specimen Type Comment:     Comment: NOT PROVIDED   JAK2 V617F Result Comment     Comment: (NOTE) POSITIVE The JAK2 V617F mutation is detected in the provided specimen of this individual. Results should be interpreted in conjunction with clinical and other laboratory findings for the most accurate interpretation. This test was developed and its performance characteristics determined by [facility]. It has not been cleared or approved by the Food and Drug Administration.    JAK2 V617F % 14.52 %   Reflex Comment     Comment: (NOTE) Reflex to CALR Mutation Analysis, JAK2 Exon 12-15 Mutation Analysis, and MPL Mutation Analysis is not indicated.    V617F Rfx CALR/MPL/E12-15 Bkgd Comment     Comment: (NOTE)    Molecular testing of blood or bone marrow is useful in the evaluation of  suspected myeloproliferative neoplasms (MPN). Mutations in the JAK2, MPL, and CALR genes are present in virtually all MPNs and their presence help distinguish benign reactive processes from clonal neoplasms. These mutations are generally considered mutually exclusive, although concurrent clones have been reported in rare patients. This test will assess for the JAK2V617F (exon 14) mutation first and will reflex to CALR mutation analysis, MPL mutation analysis, and JAK2 exon 12 to 15 mutation analysis if the JAK2V617F mutation is negative.    The JAK2 (Janus kinase 2) gene encodes for a non-receptor protein tyrosine kinase that activates cytokine and growth factor signaling. The V617F (c.1849 G>T) mutation results in constitutive activation of JAK2 and downstream STAT5 and ERK signaling. The V617F mutation is observed in approximately 95% of polycythemia vera (PV), 60% of essential thromboc ythemia (ET) and primary myelofibrosis (PMF). It is also infrequently present (3-5%) in myelodysplastic syndrome, chronic myelomonocytic leukemia,  and other atypical chronic myeloid disorders. A small percentage of JAK2 mutation positive patients (3.3%) contain other non-V617F mutations within exons 12 to 15. In particular, mutations in exon 12 of JAK2 have been described in approximately 3% of patients with PV. JAK2 allele burden correlates with clinical phenotype, with low levels of mutant allele characterized by thrombocytosis, intermediate levels with erythrocytosis, and high mutant allele burden correlating with enhanced myelopoiesis of the BM, leukocytosis, increasing spleen size, and circulating CD34-positive cells.    The CALR (Calreticulin) gene encodes for a multifunctional calcium-binding protein involved in many cellular activities such as growth, proliferation, adhesion, and programmed cell death. Among patients with JAK2 negative MPNs, CALR are found in a pproximately 70% of patients  with JAK2-negative essential thrombocythemia (ET) and 60-88% of patients with JAK2-negative primary myelofibrosis(PMF). Only a minority of patients (approximately 8%) with myelodysplasia have mutations in the CALR gene. CALR mutations are rarely detected in patients with de novo acute myeloid leukemia, chronic myelogenous leukemia, lymphoid leukemia, or solid tumors. CALR mutations are not detected in polycythemia and generally appear to be mutually exclusive with JAK2 mutations and MPL mutations. The majority of mutational changes involve a variety of insertion deletion mutations in exon 9 of the calreticulin gene: approximately 53% of all CALR mutations are a 52 bp deletion (type-1) while the second most prevalent mutation (approximately 32%) contains a 5 bp insertion (type-2). Other mutations (non-type 1 or type 2) are seen in a small minority of cases. CALR mutations in PMF tend to be with a favorable prognosis compared to JAK2 V617F [employer], whereas primary myelofibrosis negative for CALR, JAK2 V617F and MPL mutations (so-called triple negative) is associated with a poor prognosis and shorter survival.    The MPL (myeloproliferative leukemia virus oncogene) gene encodes the thrombopoietin receptor which regulates hematopoiesis and megakaryopoiesis. Activating MPL mutations are associated with a subset of myeloproliferative neoplasms and acute megakaryoblastic leukemia. MPL W515 mutations are present in approximately 5-8% of patients with primary myelofibrosis (PMF) and 1-4% of patients with essential thrombocythemia (ET). The S505 mutation is detected in patients with hereditary thrombocythemia.    Limitations    This assay has a sensitivity of approximately 1% VAF for JAK2 V617F, 2.5% VAF for other mutations in JAK2 exons 12 to 15, CALR mutations, and MPL mutations. Deletions in JAK2 up to 6 bp and insertions up to 34 bp have been detected in validation studies. Deletions in  CALR up to 70 bp and i nsertions up to 12 bp have been detected in validation studies.    Method based next generation sequencing.     Comment: Comment [employer]    References Comment     Comment: (NOTE) Alghasham N, Alnouri Y, Abalkhail H, Wilfredo Hanly. Detection of mutations in JAK2 exons 12-15 by [employer] sequencing. Int J Lab Hematol. 2016 Feb;38(1):34-41. doi: 10.1111/ijlh.44034. Epub 2015 Sep 11. PMID: 74259563. Rana Burr, Charlie Constant, Hasserjian R, Serafin Dames, Borowitz MJ, Gina Lagos MM, Starbrick CD, Cazzola M, Vardiman JW. The 2016 revision to the World Health Organization classification of myeloid neoplasms and acute leukemia. Blood. 2016 May 19;127(20):2391-405. doi: 10.1182/blood-2016-03-643544. Epub 2016 Apr 11. PMID: 87564332. Asa Bjork [facility], Zhang ZJ, Hurlock S, Albitar M. Mutation profile of JAK2 transcripts in patients with chronic myeloproliferative neoplasias. J Mol Diagn. 2009 Jan;11(1):49-53.doi: 10.2353/jmoldx.2009.080114. Epub 2008 Dec 12. PMID: 95188416; PMCID: SAY3016010. NCCN Clinical Practice Guidelines in Oncology (NCCN Guidelines) Myeloproliferative Neoplasms Version 3.2022 - September 22, 2020. Swerdlow SH, Programmer, multimedia. WHO classif ication  of Tumours of Haematopoietic and Lymphoid Tissues. 4th edn. Johanna Mutter, [place]: Geologist, engineering for [employer] on [employer]; 2017. Tefferi A. Primary myelofibrosis: 2021 update on diagnosis, risk-stratification and management. Am J Hematol. 2021 Jan;96(1):145-162. doi: 10.1002/ajh.26050. Epub 2020 Dec 2. PMID: 09811914. Vainchenker W, Kralovics R. Genetic basis and molecular pathophysiology of classical myeloproliferative neoplasms. Blood. 2017 Feb 9;129(6):667-679. doi: 10.1182/blood-2016-10-695940. Epub 2016 Dec 27. PMID: 78295621.    Director Review Comment     Comment: (NOTE) Lauri Poot, PhD, [facility] Director, Molecular Oncology [facility] [address] [place], [place]  [address] [phone] Performed At: [facility] [address] [place], [place] [id number] Adams Adams MDPhD [id number] Performed At: [facility] [address] [place], [place] [id number] Adams Adams MDPhD VF:[id number]   Alpha-Thalassemia GenotypR     Status: None   Collection Time: 06/12/23 12:39 PM  Result Value Ref Range   Alpha-Thalassemia See Scanned report in  Link     Comment: Performed at [facility], [address], [place], [place] [address]  Hgb Fractionation Cascade     Status: None   Collection Time: 06/12/23 12:39 PM  Result Value Ref Range   Hgb F 0.0 0.0 - 2.0 %   Hgb A 97.7 96.4 - 98.8 %   Hgb A2 2.3 1.8 - 3.2 %   Hgb S 0.0 0.0 %   Interpretation, Hgb Fract Comment     Comment: (NOTE) Normal hemoglobin present; no hemoglobin variant or beta thalassemia identified. Note: Alpha thalassemia may not be detected by the Hgb Fractionation Cascade panel. If alpha thalassemia is suspected, [facility] offers Alpha-Thalassemia DNA Analysis [phone]). Performed At: [facility] [address] [place], [place] [id number] Pearlean Botts MD [id number]   Lactate dehydrogenase     Status: Abnormal   Collection Time: 06/12/23 12:39 PM  Result Value Ref Range   LDH 242 (H) 98 - 192 U/L    Comment: Performed at [facility], [address] [place], [place] [address]  Sedimentation rate     Status: None   Collection Time: 06/12/23 12:39 PM  Result Value Ref Range   Sed Rate 2 0 - 22 mm/hr    Comment: Performed at [facility], [address] [place], [place] [address]  Iron and Iron Binding Capacity ([facility] only)     Status: None   Collection Time: 06/12/23 12:39 PM  Result Value Ref Range   Iron 58 28 - 170 ug/dL   TIBC 616 073 - 710 ug/dL   Saturation Ratios 16 10.4 - 31.8 %   UIBC 316 148 - 442 ug/dL    Comment: Performed at [facility],  [address], [place], [place] [address]  CMP (Cancer Center only)     Status: None   Collection Time: 06/12/23 12:39 PM  Result Value Ref Range   Sodium 139 135 - 145 mmol/L   Potassium 3.6 3.5 - 5.1 mmol/L   Chloride 103 98 - 111 mmol/L   CO2 29 22 - 32 mmol/L   Glucose, Bld 90 70 - 99 mg/dL    Comment: Glucose reference range applies only to samples taken after fasting for at least 8 hours.   BUN 19 6 - 20 mg/dL   Creatinine 8.54 6.27 - 1.00 mg/dL   Calcium 03.5 8.9 - 00.9 mg/dL   Total Protein 7.9 6.5 - 8.1 g/dL   Albumin 4.9 3.5 - 5.0 g/dL  AST 17 15 - 41 U/L   ALT 14 0 - 44 U/L   Alkaline Phosphatase 60 38 - 126 U/L   Total Bilirubin 0.5 0.0 - 1.2 mg/dL   GFR, Estimated >16 >10 mL/min    Comment: (NOTE) Calculated using the CKD-EPI Creatinine Equation (2021)    Anion gap 7 5 - 15    Comment: Performed at [facility], [address], [place], [place] [address]  CBC with Differential (Cancer Center Only)     Status: Abnormal   Collection Time: 06/12/23 12:39 PM  Result Value Ref Range   WBC Count 8.4 4.0 - 10.5 K/uL   RBC 6.71 (H) 3.87 - 5.11 MIL/uL   Hemoglobin 13.4 12.0 - 15.0 g/dL   HCT 40.9 81.1 - 91.4 %   MCV 66.8 (L) 80.0 - 100.0 fL   MCH 20.0 (L) 26.0 - 34.0 pg   MCHC 29.9 (L) 30.0 - 36.0 g/dL   RDW 78.2 (H) 95.6 - 21.3 %   Platelet Count 488 (H) 150 - 400 K/uL   nRBC 0.0 0.0 - 0.2 %   Neutrophils Relative % 44 %   Neutro Abs 3.7 1.7 - 7.7 K/uL   Lymphocytes Relative 45 %   Lymphs Abs 3.8 0.7 - 4.0 K/uL   Monocytes Relative 8 %   Monocytes Absolute 0.6 0.1 - 1.0 K/uL   Eosinophils Relative 2 %   Eosinophils Absolute 0.2 0.0 - 0.5 K/uL   Basophils Relative 1 %   Basophils Absolute 0.1 0.0 - 0.1 K/uL   WBC Morphology MORPHOLOGY UNREMARKABLE    Smear Review LARGE PLTS    Immature Granulocytes 0 %   Abs Immature Granulocytes 0.02 0.00 - 0.07 K/uL   Ovalocytes PRESENT     Comment: Performed at [facility]  [facility]atory, [address] [place], [place] [address]  C-reactive protein     Status: None   Collection Time: 06/12/23 12:40 PM  Result Value Ref Range   CRP 0.5 <1.0 mg/dL    Comment: Performed at [facility], [address], [place], [place] [address]  Ferritin     Status: None   Collection Time: 06/12/23 12:40 PM  Result Value Ref Range   Ferritin 119 11 - 307 ng/mL    Comment: Performed at [facility], [address] [place], [place] [address]     RADIOGRAPHIC STUDIES:  I have personally reviewed the radiological images as listed and agree with the findings in the report.  MM 3D DIAGNOSTIC MAMMOGRAM UNILATERAL RIGHT BREAST Result Date: 05/30/2023 CLINICAL DATA:  RIGHT asymmetry callback EXAM: DIGITAL DIAGNOSTIC UNILATERAL RIGHT MAMMOGRAM WITH TOMOSYNTHESIS AND CAD TECHNIQUE: Right digital diagnostic mammography and breast tomosynthesis was performed. The images were evaluated with computer-aided detection. COMPARISON:  Previous exam(s). ACR Breast Density Category c: The breasts are heterogeneously dense, which may obscure small masses. FINDINGS: The previously described finding does not persist with additional views, consistent with superimposed fibroglandular tissue. No suspicious mass, microcalcification, or other finding is identified. IMPRESSION: No mammographic evidence of malignancy. RECOMMENDATION: Screening mammogram in one year.(Code:[id number]) I have discussed the findings and recommendations with the patient. If applicable, a reminder letter will be sent to the patient regarding the next appointment. BI-RADS CATEGORY  1: Negative. Electronically Signed   By: Clancy Crimes M.D.   On: 05/30/2023 14:58    Orders Placed This Encounter  Procedures   CT BONE MARROW BIOPSY & ASPIRATION    Standing Status:  Future    Expected Date:   07/04/2023    Expiration Date:   06/26/2024    Reason for Exam (SYMPTOM  OR DIAGNOSIS REQUIRED):   Patient with  JAK2 mutation positivity, suspected essential thrombocytosis. Please arrange for bone marrow biopsy and aspiration.    Is patient pregnant?:   No    Preferred imaging location?:   [facility]    Radiology Contrast Protocol - do NOT remove file path:   \\[email or web address]\Radiant\CTProtocols.pdf   CBC with Differential (Cancer Center Only)    Standing Status:   Future    Expected Date:   07/25/2023    Expiration Date:   06/26/2024   CMP (Cancer Center only)    Standing Status:   Future    Expected Date:   07/25/2023    Expiration Date:   06/26/2024   Lactate dehydrogenase    Standing Status:   Future    Expected Date:   07/25/2023    Expiration Date:   06/26/2024     Future Appointments  Date Time Provider Department Center  07/25/2023 11:15 AM [facility] LAB [facility] None  07/25/2023 11:45 AM Aaryn Parrilla, MD [facility] None  07/26/2023  9:30 AM [facility] ROOM [facility] None  07/26/2023 11:00 AM [facility] 1 [facility]-CT [place]    This document was completed utilizing speech recognition software. Grammatical errors, random word insertions, pronoun errors, and incomplete sentences are an occasional consequence of this system due to software limitations, ambient noise, and hardware issues. Any formal questions or concerns about the content, text or information contained within the body of this dictation should be directly addressed to the provider for clarification.

## 2023-06-27 NOTE — Assessment & Plan Note (Signed)
 Thrombocytosis with a platelet count of 500,000, exceeding the normal range of 150,000 to 450,000. Differential diagnosis includes iron deficiency, inflammation, or a bone marrow mutation causing overproduction of platelets. Occasional gum bleeding noted. No splenectomy history.   On her initial consultation with us  on 06/12/2023, labs showed platelet count of 488,000, remains elevated.  White count 8400 with normal differential.  Hemoglobin normal at 13.4, MCV decreased at 66.8.  Iron studies showed no evidence of iron deficiency.  Sed rate normal at 2 mm/h.  CRP normal at 0.5 mg/dL.  LDH was increased at 242.  JAK2 V617F mutation was positive.   Clinical picture concerning for primary myeloproliferative neoplasm like ET.  This was explained to the patient.  If confirmed, goal would be to maintain platelet count below 400,000 with cytoreductive therapy using hydroxyurea.  We will arrange for bone marrow biopsy/aspiration and see her in clinic to discuss results.  Tentatively we will schedule appointment to return in 4 weeks.

## 2023-06-28 ENCOUNTER — Other Ambulatory Visit: Payer: Self-pay

## 2023-06-28 DIAGNOSIS — G4709 Other insomnia: Secondary | ICD-10-CM

## 2023-07-24 ENCOUNTER — Other Ambulatory Visit: Payer: Self-pay | Admitting: Radiology

## 2023-07-24 DIAGNOSIS — D75839 Thrombocytosis, unspecified: Secondary | ICD-10-CM

## 2023-07-25 ENCOUNTER — Other Ambulatory Visit

## 2023-07-25 ENCOUNTER — Other Ambulatory Visit: Payer: Self-pay | Admitting: Radiology

## 2023-07-25 ENCOUNTER — Ambulatory Visit: Admitting: Oncology
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
## 2023-07-26 ENCOUNTER — Ambulatory Visit (HOSPITAL_COMMUNITY)

## 2023-07-26 ENCOUNTER — Encounter (HOSPITAL_COMMUNITY): Payer: Self-pay

## 2023-07-26 ENCOUNTER — Ambulatory Visit (HOSPITAL_COMMUNITY)
Admission: RE | Admit: 2023-07-26 | Discharge: 2023-07-26 | Disposition: A | Discharge status: Home / Self Care | Source: Ambulatory Visit | Attending: Oncology

## 2023-07-26 ENCOUNTER — Ambulatory Visit (HOSPITAL_COMMUNITY)
Admission: RE | Admit: 2023-07-26 | Discharge: 2023-07-26 | Disposition: A | Discharge status: Home / Self Care | Source: Ambulatory Visit | Attending: Oncology | Admitting: Oncology

## 2023-07-26 DIAGNOSIS — D75839 Thrombocytosis, unspecified: Secondary | ICD-10-CM | POA: Insufficient documentation

## 2023-07-26 DIAGNOSIS — Z87891 Personal history of nicotine dependence: Secondary | ICD-10-CM | POA: Insufficient documentation

## 2023-07-26 DIAGNOSIS — Z1589 Genetic susceptibility to other disease: Secondary | ICD-10-CM | POA: Insufficient documentation

## 2023-07-26 HISTORY — PX: IR BONE MARROW BIOPSY & ASPIRATION: IMG5727

## 2023-07-26 LAB — CBC WITH DIFFERENTIAL/PLATELET
Abs Immature Granulocytes: 0.03 10*3/uL (ref 0.00–0.07)
Basophils Absolute: 0.1 10*3/uL (ref 0.0–0.1)
Basophils Relative: 1 %
Eosinophils Absolute: 0.2 10*3/uL (ref 0.0–0.5)
Eosinophils Relative: 2 %
HCT: 47.7 % — ABNORMAL HIGH (ref 36.0–46.0)
Hemoglobin: 13.7 g/dL (ref 12.0–15.0)
Immature Granulocytes: 0 %
Lymphocytes Relative: 41 %
Lymphs Abs: 3 10*3/uL (ref 0.7–4.0)
MCH: 20.2 pg — ABNORMAL LOW (ref 26.0–34.0)
MCHC: 28.7 g/dL — ABNORMAL LOW (ref 30.0–36.0)
MCV: 70.4 fL — ABNORMAL LOW (ref 80.0–100.0)
Monocytes Absolute: 0.6 10*3/uL (ref 0.1–1.0)
Monocytes Relative: 8 %
Neutro Abs: 3.4 10*3/uL (ref 1.7–7.7)
Neutrophils Relative %: 48 %
Platelets: 526 10*3/uL — ABNORMAL HIGH (ref 150–400)
RBC: 6.78 MIL/uL — ABNORMAL HIGH (ref 3.87–5.11)
RDW: 20.1 % — ABNORMAL HIGH (ref 11.5–15.5)
WBC: 7.3 10*3/uL (ref 4.0–10.5)
nRBC: 0 % (ref 0.0–0.2)

## 2023-07-26 MED ORDER — FENTANYL CITRATE (PF) 100 MCG/2ML IJ SOLN
INTRAMUSCULAR | Status: AC
Start: 2023-07-26 — End: 2023-07-26
  Filled 2023-07-26: qty 2

## 2023-07-26 MED ORDER — FENTANYL CITRATE (PF) 100 MCG/2ML IJ SOLN
INTRAMUSCULAR | Status: AC | PRN
  Administered 2023-07-26 (×2): 50 ug via INTRAVENOUS

## 2023-07-26 MED ORDER — SODIUM CHLORIDE 0.9 % IV SOLN: INTRAVENOUS | Status: DC

## 2023-07-26 MED ORDER — DIPHENHYDRAMINE HCL 50 MG/ML IJ SOLN
INTRAMUSCULAR | Status: AC
  Filled 2023-07-26: qty 1

## 2023-07-26 MED ORDER — LIDOCAINE HCL (PF) 1 % IJ SOLN
INTRAMUSCULAR | Status: AC
  Filled 2023-07-26: qty 30

## 2023-07-26 MED ORDER — LIDOCAINE HCL (PF) 1 % IJ SOLN
30.0000 mL | Freq: Once | INTRAMUSCULAR | Status: AC
  Administered 2023-07-26: 20 mL via INTRADERMAL

## 2023-07-26 MED ORDER — MIDAZOLAM HCL 2 MG/2ML IJ SOLN
INTRAMUSCULAR | Status: AC
Start: 2023-07-26 — End: 2023-07-26
  Filled 2023-07-26: qty 4

## 2023-07-26 MED ORDER — DIPHENHYDRAMINE HCL 50 MG/ML IJ SOLN
INTRAMUSCULAR | Status: AC | PRN
  Administered 2023-07-26: 25 mg via INTRAVENOUS

## 2023-07-26 MED ORDER — MIDAZOLAM HCL 2 MG/2ML IJ SOLN
INTRAMUSCULAR | Status: AC | PRN
  Administered 2023-07-26 (×2): 1 mg via INTRAVENOUS

## 2023-07-26 NOTE — Discharge Instructions (Signed)
 Bone Marrow Aspiration and Bone Marrow Biopsy, Adult, Care After  The following information offers guidance on how to care for yourself after your procedure. Your health care provider may also give you more specific instructions. If you have problems or questions, contact your health care provider.  What can I expect after the procedure?  May remove dressing or bandaid and shower tomorrow.  Keep site clean and dry. Replace with clean dressing or bandaid as necessary. Urgent needs IR clinic [phone] (mon-fri 8-5).  After the procedure, it is common to have: Mild pain and tenderness. Swelling. Bruising. Follow these instructions at home: Incision care  Follow instructions from your health care provider about how to take care of the incision site. Make sure you: Wash your hands with soap and water for at least 20 seconds before and after you change your bandage (dressing). If soap and water are not available, use hand sanitizer. Change your dressing as told by your health care provider. Leave stitches (sutures), skin glue, or adhesive strips in place. These skin closures may need to stay in place for 2 weeks or longer. If adhesive strip edges start to loosen and curl up, you may trim the loose edges. Do not remove adhesive strips completely unless your health care provider tells you to do that. Check your incision site every day for signs of infection. Check for: More redness, swelling, or pain. Fluid or blood. Warmth. Pus or a bad smell. Activity Return to your normal activities as told by your health care provider. Ask your health care provider what activities are safe for you. Do not lift anything that is heavier than 10 lb (4.5 kg), or the limit that you are told, until your health care provider says that it is safe. If you were given a sedative during the procedure, it can affect you for several hours. Do not drive or operate machinery until your health care provider says that it is  safe. General instructions  Take over-the-counter and prescription medicines only as told by your health care provider. Do not take baths, swim, or use a hot tub until your health care provider approves. Ask your health care provider if you may take showers. You may only be allowed to take sponge baths. If directed, put ice on the affected area. To do this: Put ice in a plastic bag. Place a towel between your skin and the bag. Leave the ice on for 20 minutes, 2-3 times a day. If your skin turns bright red, remove the ice right away to prevent skin damage. The risk of skin damage is higher if you cannot feel pain, heat, or cold. Contact a health care provider if: You have signs of infection. Your pain is not controlled with medicine. You have cancer, and a temperature of 100.22F (38C) or higher. Get help right away if: You have a temperature of 101F (38.3C) or higher, or as told by your health care provider. You have bleeding from the incision site that cannot be controlled. This information is not intended to replace advice given to you by your health care provider. Make sure you discuss any questions you have with your health care provider. Document Revised: 06/05/2021 Document Reviewed: 06/05/2021 Elsevier Patient Education  2023 Elsevier Inc.     Moderate Conscious Sedation, Adult, Care After  This sheet gives you information about how to care for yourself after your procedure. Your health care provider may also give you more specific instructions. If you have problems or questions, contact  your health care provider. What can I expect after the procedure? After the procedure, it is common to have: Sleepiness for several hours. Impaired judgment for several hours. Difficulty with balance. Vomiting if you eat too soon. Follow these instructions at home: For the time period you were told by your health care provider:     Rest. Do not participate in activities where you  could fall or become injured. Do not drive or use machinery. Do not drink alcohol. Do not take sleeping pills or medicines that cause drowsiness. Do not make important decisions or sign legal documents. Do not take care of children on your own. Eating and drinking  Follow the diet recommended by your health care provider. Drink enough fluid to keep your urine pale yellow. If you vomit: Drink water, juice, or soup when you can drink without vomiting. Make sure you have little or no nausea before eating solid foods. General instructions Take over-the-counter and prescription medicines only as told by your health care provider. Have a responsible adult stay with you for the time you are told. It is important to have someone help care for you until you are awake and alert. Do not smoke. Keep all follow-up visits as told by your health care provider. This is important. Contact a health care provider if: You are still sleepy or having trouble with balance after 24 hours. You feel light-headed. You keep feeling nauseous or you keep vomiting. You develop a rash. You have a fever. You have redness or swelling around the IV site. Get help right away if: You have trouble breathing. You have new-onset confusion at home. Summary After the procedure, it is common to feel sleepy, have impaired judgment, or feel nauseous if you eat too soon. Rest after you get home. Know the things you should not do after the procedure. Follow the diet recommended by your health care provider and drink enough fluid to keep your urine pale yellow. Get help right away if you have trouble breathing or new-onset confusion at home. This information is not intended to replace advice given to you by your health care provider. Make sure you discuss any questions you have with your health care provider. Document Revised: 05/29/2019 Document Reviewed: 12/25/2018 Elsevier Patient Education  2023 [employer].

## 2023-07-26 NOTE — Procedures (Signed)
 Vascular and Interventional Radiology Procedure Note  Patient: Kaitlyn Solomon DOB: 12-01-1965 Medical Record Number: [id number] Note Date/Time: 07/26/23 9:40 AM   Performing Physician: Art Largo, MD Assistant(s): None  Diagnosis: Thrombocytosis   Procedure: BONE MARROW ASPIRATION and BIOPSY  Anesthesia: Conscious Sedation Complications: None Estimated Blood Loss: Minimal Specimens: Sent for Pathology  Findings:  Successful Fluoroscopy-guided bone marrow aspiration and biopsy A total of 1 cores were obtained. Hemostasis of the tract was achieved using Manual Pressure.  Plan: Bed rest for 1 hours.  See detailed procedure note with images in PACS. The patient tolerated the procedure well without incident or complication and was returned to Recovery in stable condition.    Art Largo, MD [facility]   Pager. [phone] Clinic. [phone]

## 2023-07-30 LAB — SURGICAL PATHOLOGY

## 2023-08-05 ENCOUNTER — Encounter (HOSPITAL_COMMUNITY): Payer: Self-pay | Admitting: Oncology

## 2023-08-07 ENCOUNTER — Inpatient Hospital Stay: Attending: Oncology

## 2023-08-07 ENCOUNTER — Inpatient Hospital Stay (HOSPITAL_BASED_OUTPATIENT_CLINIC_OR_DEPARTMENT_OTHER): Admitting: Oncology

## 2023-08-07 ENCOUNTER — Encounter: Payer: Self-pay | Admitting: Oncology

## 2023-08-07 VITALS — BP 131/92 | HR 70 | Temp 97.7°F | Resp 18 | Ht 62.0 in | Wt 150.3 lb

## 2023-08-07 DIAGNOSIS — D563 Thalassemia minor: Secondary | ICD-10-CM

## 2023-08-07 DIAGNOSIS — D751 Secondary polycythemia: Secondary | ICD-10-CM | POA: Diagnosis present

## 2023-08-07 DIAGNOSIS — D471 Chronic myeloproliferative disease: Secondary | ICD-10-CM | POA: Diagnosis not present

## 2023-08-07 DIAGNOSIS — D75839 Thrombocytosis, unspecified: Secondary | ICD-10-CM | POA: Insufficient documentation

## 2023-08-07 DIAGNOSIS — I10 Essential (primary) hypertension: Secondary | ICD-10-CM | POA: Insufficient documentation

## 2023-08-07 DIAGNOSIS — E039 Hypothyroidism, unspecified: Secondary | ICD-10-CM | POA: Diagnosis not present

## 2023-08-07 LAB — CMP (CANCER CENTER ONLY)
ALT: 17 U/L (ref 0–44)
AST: 21 U/L (ref 15–41)
Albumin: 4.4 g/dL (ref 3.5–5.0)
Alkaline Phosphatase: 53 U/L (ref 38–126)
Anion gap: 7 (ref 5–15)
BUN: 19 mg/dL (ref 6–20)
CO2: 27 mmol/L (ref 22–32)
Calcium: 9.5 mg/dL (ref 8.9–10.3)
Chloride: 105 mmol/L (ref 98–111)
Creatinine: 0.85 mg/dL (ref 0.44–1.00)
GFR, Estimated: >60 mL/min (ref >60)
Glucose, Bld: 89 mg/dL (ref 70–99)
Potassium: 4 mmol/L (ref 3.5–5.1)
Sodium: 139 mmol/L (ref 135–145)
Total Bilirubin: 0.4 mg/dL (ref 0.0–1.2)
Total Protein: 7.4 g/dL (ref 6.5–8.1)

## 2023-08-07 LAB — CBC WITH DIFFERENTIAL (CANCER CENTER ONLY)
Abs Immature Granulocytes: 0.02 10*3/uL (ref 0.00–0.07)
Basophils Absolute: 0.1 10*3/uL (ref 0.0–0.1)
Basophils Relative: 1 %
Eosinophils Absolute: 0.2 10*3/uL (ref 0.0–0.5)
Eosinophils Relative: 2 %
HCT: 40.8 % (ref 36.0–46.0)
Hemoglobin: 12.5 g/dL (ref 12.0–15.0)
Immature Granulocytes: 0 %
Lymphocytes Relative: 42 %
Lymphs Abs: 3.5 10*3/uL (ref 0.7–4.0)
MCH: 20.2 pg — ABNORMAL LOW (ref 26.0–34.0)
MCHC: 30.6 g/dL (ref 30.0–36.0)
MCV: 65.8 fL — ABNORMAL LOW (ref 80.0–100.0)
Monocytes Absolute: 0.7 10*3/uL (ref 0.1–1.0)
Monocytes Relative: 8 %
Neutro Abs: 3.9 10*3/uL (ref 1.7–7.7)
Neutrophils Relative %: 47 %
Platelet Count: 522 10*3/uL — ABNORMAL HIGH (ref 150–400)
RBC: 6.2 MIL/uL — ABNORMAL HIGH (ref 3.87–5.11)
RDW: 19.7 % — ABNORMAL HIGH (ref 11.5–15.5)
WBC Count: 8.3 10*3/uL (ref 4.0–10.5)
nRBC: 0 % (ref 0.0–0.2)

## 2023-08-07 LAB — LACTATE DEHYDROGENASE: LDH: 206 U/L — ABNORMAL HIGH (ref 98–192)

## 2023-08-07 MED ORDER — HYDROXYUREA 500 MG PO CAPS: 500.0000 mg | ORAL_CAPSULE | Freq: Every day | ORAL | 3 refills | Status: AC

## 2023-08-07 NOTE — Assessment & Plan Note (Addendum)
 Thrombocytosis with a platelet count of 500,000, exceeding the normal range of 150,000 to 450,000. Differential diagnosis includes iron deficiency, inflammation, or a bone marrow mutation causing overproduction of platelets. Occasional gum bleeding noted. No splenectomy history.   On her initial consultation with us  on 06/12/2023, labs showed platelet count of 488,000, remains elevated.  White count 8400 with normal differential.  Hemoglobin normal at 13.4, MCV decreased at 66.8.  Iron studies showed no evidence of iron deficiency.  Sed rate normal at 2 mm/h.  CRP normal at 0.5 mg/dL.  LDH was increased at 242.  JAK2 V617F mutation was positive.   On 07/26/2023, she underwent bone marrow biopsy which showed hypercellular bone marrow (70%) involved by JAK2 positive myeloproliferative neoplasm.  Megakaryocytic hyperplasia and atypia and mild expansion of the erythroid lineage was noted.  Overall findings were consistent with primary myeloproliferative neoplasm (ET/PCV).  No increase in blasts or fibrosis.  Conventional cytogenetics showed normal female karyotype, 80 XX.  Today I discussed results of the bone marrow biopsy in detail with the patient.  Explained that we are dealing with primary myeloproliferative neoplasm.   Microcytosis with small red blood cell size and slightly elevated total red blood cell count, but normal hemoglobin levels.  Since iron studies showed no evidence of iron deficiency, we proceeded with hemoglobin electrophoresis and alpha thalassemia testing.  Hemoglobin electrophoresis showed no evidence of hemoglobinopathy.  However alpha thalassemia testing did not show evidence of homozygosity for -alpha 3.7 deletion, indicating alpha thalassemia trait.  Alpha thalassemia trait could be masking her polycythemia vera. Though RBC count is increased, hemoglobin is within normal limits.   R-IPSET score of 2, intermediate risk for thrombosis.  Goal is to maintain platelet count below 400,000  to prevent thrombosis related complications.    Started her on hydroxyurea 500 mg daily from today (08/07/2023).  Discussed potential side effects including leg ulcers, fatigue, and nausea, though uncommon at the starting dose. Aspirin 81 mg daily is recommended to manage vasomotor symptoms and reduce clotting risk. The condition is not hereditary but can progress to leukemia if untreated. Treatment aims to prevent leukemic transformation by controlling abnormal marrow cell production.

## 2023-08-07 NOTE — Assessment & Plan Note (Signed)
 Microcytosis with small red blood cell size and slightly elevated total red blood cell count, but normal hemoglobin levels.  Since iron studies showed no evidence of iron deficiency, we proceeded with hemoglobin electrophoresis and alpha thalassemia testing.  Hemoglobin electrophoresis showed no evidence of hemoglobinopathy.  However alpha thalassemia testing did not show evidence of homozygosity for -alpha 3.7 deletion, indicating alpha thalassemia trait.  Alpha thalassemia trait could be masking her polycythemia vera.  Though RBC count is increased, hemoglobin is within normal limits.

## 2023-08-07 NOTE — Progress Notes (Signed)
 [facility]  HEMATOLOGY CLINIC PROGRESS NOTE  PATIENT NAME: Kaitlyn Solomon   MR#: [id number] DOB: Apr 10, 1965  Patient Care Team: Kaitlyn Solomon as PCP - General (Physician Assistant)  Date of visit: 08/07/2023   ASSESSMENT & PLAN:   Kaitlyn Solomon is a 58 y.o. lady with a past medical history of hypertension, hypothyroidism, GERD, bipolar disorder, history of subarachnoid hemorrhage in 2019, was referred to our service in April 2025 for evaluation of thrombocytosis and polycythemia.    Myeloproliferative neoplasm (HCC) Thrombocytosis with a platelet count of 500,000, exceeding the normal range of 150,000 to 450,000. Differential diagnosis includes iron deficiency, inflammation, or a bone marrow mutation causing overproduction of platelets. Occasional gum bleeding noted. No splenectomy history.   On her initial consultation with us  on 06/12/2023, labs showed platelet count of 488,000, remains elevated.  White count 8400 with normal differential.  Hemoglobin normal at 13.4, MCV decreased at 66.8.  Iron studies showed no evidence of iron deficiency.  Sed rate normal at 2 mm/h.  CRP normal at 0.5 mg/dL.  LDH was increased at 242.  JAK2 V617F mutation was positive.   On 07/26/2023, she underwent bone marrow biopsy which showed hypercellular bone marrow (70%) involved by JAK2 positive myeloproliferative neoplasm.  Megakaryocytic hyperplasia and atypia and mild expansion of the erythroid lineage was noted.  Overall findings were consistent with primary myeloproliferative neoplasm (ET/PCV).  No increase in blasts or fibrosis.  Conventional cytogenetics showed normal female karyotype, 33 XX.  Today I discussed results of the bone marrow biopsy in detail with the patient.  Explained that we are dealing with primary myeloproliferative neoplasm.   Microcytosis with small red blood cell size and slightly elevated total red blood cell count, but normal hemoglobin levels.  Since  iron studies showed no evidence of iron deficiency, we proceeded with hemoglobin electrophoresis and alpha thalassemia testing.  Hemoglobin electrophoresis showed no evidence of hemoglobinopathy.  However alpha thalassemia testing did not show evidence of homozygosity for -alpha 3.7 deletion, indicating alpha thalassemia trait.  Alpha thalassemia trait could be masking her polycythemia vera. Though RBC count is increased, hemoglobin is within normal limits.   R-IPSET score of 2, intermediate risk for thrombosis.  Goal is to maintain platelet count below 400,000 to prevent thrombosis related complications.    Started her on hydroxyurea 500 mg daily from today (08/07/2023).  Discussed potential side effects including leg ulcers, fatigue, and nausea, though uncommon at the starting dose. Aspirin 81 mg daily is recommended to manage vasomotor symptoms and reduce clotting risk. The condition is not hereditary but can progress to leukemia if untreated. Treatment aims to prevent leukemic transformation by controlling abnormal marrow cell production.  Alpha thalassaemia minor Microcytosis with small red blood cell size and slightly elevated total red blood cell count, but normal hemoglobin levels.  Since iron studies showed no evidence of iron deficiency, we proceeded with hemoglobin electrophoresis and alpha thalassemia testing.  Hemoglobin electrophoresis showed no evidence of hemoglobinopathy.  However alpha thalassemia testing did not show evidence of homozygosity for -alpha 3.7 deletion, indicating alpha thalassemia trait.  Alpha thalassemia trait could be masking her polycythemia vera.  Though RBC count is increased, hemoglobin is within normal limits.    I spent a total of 30 minutes during this encounter with the patient including review of chart and various tests results, discussions about plan of care and coordination of care plan.  I reviewed lab results and outside records for this visit and  discussed relevant results with the patient. Diagnosis, plan of care and treatment options were also discussed in detail with the patient. Opportunity provided to ask questions and answers provided to her apparent satisfaction. Provided instructions to call our clinic with any problems, questions or concerns prior to return visit. I recommended to continue follow-up with PCP and sub-specialists. She verbalized understanding and agreed with the plan. No barriers to learning was detected.  Kaitlyn Patten, MD  08/07/2023 5:40 PM  [facility] - [facility] [address] [place] [address] Dept: [phone] Dept Fax: [phone]   CHIEF COMPLAINT/ REASON FOR VISIT:  Myeloproliferative neoplasm (essential thrombocythemia/polycythemia vera), diagnosed in June 2025, JAK2 V617F mutation positive.  INTERVAL HISTORY:  Discussed the use of AI scribe software for clinical note transcription with the patient, who gave verbal consent to proceed.  History of Present Illness Kaitlyn Solomon is a 58 year old female with JAK2 mutation who presents for management of elevated platelet count.  She has a JAK2 mutation with an elevated platelet count, recently measured at 522,000, up from 488,000 at the initial visit. She has not received any treatment for this condition prior to today's visit.  She also has an alpha thalassemia trait, resulting in a normal hemoglobin level despite an increased red cell count and small red cell size. This condition is monitored separately and does not require treatment.  She experiences frequent palpitations. No dizziness, chest congestion, wheezing, or other vasomotor symptoms. She is not currently taking any aspirin or blood thinners.  Current laboratory results show a normal white blood cell count, a hemoglobin level of 12.5, and normal kidney, liver, and electrolyte levels.   SUMMARY OF  HEMATOLOGIC HISTORY:  On 02/01/2023, labs at her PCPs office showed hemoglobin of 13.3, hematocrit 44.9, MCV 68.800.  Platelet count was 500,000.  White count 7700 with normal differential.  Red blood cell count was elevated at 6.5 million.  Iron studies were grossly within normal limits.  Thyroid  function tests were normal as well.   On 05/06/2023, repeat labs showed platelet count of 06/21/1998.  Hemoglobin 13.6, hematocrit 46.2, MCV 68.2.  White count 9800 with normal differential.  She was referred to us  for further evaluation of thrombocytosis, microcytosis.   She has a history of heavy menstrual cycles, which led to a partial hysterectomy. She no longer experiences menstrual cycles. She reports occasional gum bleeding but denies any other bleeding issues such as blood in stools, black stools, or epistaxis.   There is no known family history of sickle cell disease, and she has not had any surgeries on her spleen. She does not smoke and consumes alcohol occasionally. No unintentional weight loss. She is due for a colonoscopy, as it has been more than five years since her last screening. She is up to date with her mammogram screenings.   Patient has never been diagnosed with thrombotic events.   Differential diagnosis includes iron deficiency, inflammation, or a bone marrow mutation causing overproduction of platelets. Occasional gum bleeding noted.    On her initial consultation with us  on 06/12/2023, labs showed platelet count of 488,000, remains elevated.  White count 8400 with normal differential.  Hemoglobin normal at 13.4, MCV decreased at 66.8.  Iron studies showed no evidence of iron deficiency.  Sed rate normal at 2 mm/h.  CRP normal at 0.5 mg/dL.  LDH was increased at 242.  JAK2 V617F mutation was positive.  On 07/26/2023, she underwent bone marrow biopsy which showed hypercellular bone marrow (70%) involved by JAK2 positive myeloproliferative neoplasm.  Megakaryocytic hyperplasia and  atypia and mild expansion of the erythroid lineage was noted.  Overall findings were consistent with primary myeloproliferative neoplasm (ET/PCV).  No increase in blasts or fibrosis.  Conventional cytogenetics showed normal female karyotype, 52 XX.    Microcytosis with small red blood cell size and slightly elevated total red blood cell count, but normal hemoglobin levels.  Since iron studies showed no evidence of iron deficiency, we proceeded with hemoglobin electrophoresis and alpha thalassemia testing.  Hemoglobin electrophoresis showed no evidence of hemoglobinopathy.  However alpha thalassemia testing did not show evidence of homozygosity for -alpha 3.7 deletion, indicating alpha thalassemia trait.  Alpha thalassemia trait could be masking her polycythemia vera. Though RBC count is increased, hemoglobin is within normal limits.   R-IPSET score of 2, intermediate risk for thrombosis.  Goal is to maintain platelet count below 400,000 to prevent thrombosis related complications.    Started her on hydroxyurea 500 mg daily from 08/07/2023.  I have reviewed the past medical history, past surgical history, social history and family history with the patient and they are unchanged from previous note.  ALLERGIES: She is allergic to lamotrigine and codeine.  MEDICATIONS:  Current Outpatient Medications  Medication Sig Dispense Refill   amLODipine  (NORVASC ) 10 MG tablet TAKE 1 TABLET(10 MG) BY MOUTH DAILY 90 tablet 0   cholecalciferol (VITAMIN D ) 1000 UNITS tablet Take 1,000 Units by mouth daily.     FLUoxetine (PROZAC) 40 MG capsule Take 40 mg by mouth daily.     hydroxyurea (HYDREA) 500 MG capsule Take 1 capsule (500 mg total) by mouth daily. May take with food to minimize GI side effects. 30 capsule 3   levothyroxine  (SYNTHROID ) 88 MCG tablet Take 88 mcg by mouth daily.     Multiple Vitamin (MULTIVITAMIN) tablet Take 1 tablet by mouth daily.     pantoprazole (PROTONIX) 20 MG tablet Take 20 mg by  mouth daily as needed for heartburn or indigestion.     No current facility-administered medications for this visit.     REVIEW OF SYSTEMS:    Review of Systems - Oncology  All other pertinent systems were reviewed with the patient and are negative.  PHYSICAL EXAMINATION:    Onc Performance Status - 08/07/23 1435       ECOG Perf Status   ECOG Perf Status Fully active, able to carry on all pre-disease performance without restriction      KPS SCALE   KPS % SCORE Normal activity with effort, some s/s of disease          Vitals:   08/07/23 1421  BP: (!) 131/92  Pulse: 70  Resp: 18  Temp: 97.7 F (36.5 C)  SpO2: 99%   Filed Weights   08/07/23 1421  Weight: 150 lb 4.8 oz (68.2 kg)    Physical Exam Constitutional:      General: She is not in acute distress.    Appearance: Normal appearance.  HENT:     Head: Normocephalic and atraumatic.   Cardiovascular:     Rate and Rhythm: Normal rate and regular rhythm.     Heart sounds: Normal heart sounds.  Pulmonary:     Effort: Pulmonary effort is normal. No respiratory distress.     Breath sounds: Normal breath sounds.  Abdominal:     General: There is no distension.     Palpations: There is no  mass.   Neurological:     General: No focal deficit present.     Mental Status: She is alert and oriented to person, place, and time.   Psychiatric:        Mood and Affect: Mood normal.        Behavior: Behavior normal.     LABORATORY DATA:   I have reviewed the data as listed.  Results for orders placed or performed in visit on 08/07/23  Lactate dehydrogenase  Result Value Ref Range   LDH 206 (H) 98 - 192 U/L  CMP (Cancer Center only)  Result Value Ref Range   Sodium 139 135 - 145 mmol/L   Potassium 4.0 3.5 - 5.1 mmol/L   Chloride 105 98 - 111 mmol/L   CO2 27 22 - 32 mmol/L   Glucose, Bld 89 70 - 99 mg/dL   BUN 19 6 - 20 mg/dL   Creatinine 9.14 9.55 - 1.00 mg/dL   Calcium 9.5 8.9 - 89.6 mg/dL   Total  Protein 7.4 6.5 - 8.1 g/dL   Albumin 4.4 3.5 - 5.0 g/dL   AST 21 15 - 41 U/L   ALT 17 0 - 44 U/L   Alkaline Phosphatase 53 38 - 126 U/L   Total Bilirubin 0.4 0.0 - 1.2 mg/dL   GFR, Estimated >39 >39 mL/min   Anion gap 7 5 - 15  CBC with Differential (Cancer Center Only)  Result Value Ref Range   WBC Count 8.3 4.0 - 10.5 K/uL   RBC 6.20 (H) 3.87 - 5.11 MIL/uL   Hemoglobin 12.5 12.0 - 15.0 g/dL   HCT 59.1 63.9 - 53.9 %   MCV 65.8 (L) 80.0 - 100.0 fL   MCH 20.2 (L) 26.0 - 34.0 pg   MCHC 30.6 30.0 - 36.0 g/dL   RDW 80.2 (H) 88.4 - 84.4 %   Platelet Count 522 (H) 150 - 400 K/uL   nRBC 0.0 0.0 - 0.2 %   Neutrophils Relative % 47 %   Neutro Abs 3.9 1.7 - 7.7 K/uL   Lymphocytes Relative 42 %   Lymphs Abs 3.5 0.7 - 4.0 K/uL   Monocytes Relative 8 %   Monocytes Absolute 0.7 0.1 - 1.0 K/uL   Eosinophils Relative 2 %   Eosinophils Absolute 0.2 0.0 - 0.5 K/uL   Basophils Relative 1 %   Basophils Absolute 0.1 0.0 - 0.1 K/uL   Immature Granulocytes 0 %   Abs Immature Granulocytes 0.02 0.00 - 0.07 K/uL    Lab Results  Component Value Date   WBC 8.3 08/07/2023   NEUTROABS 3.9 08/07/2023   HGB 12.5 08/07/2023   HCT 40.8 08/07/2023   MCV 65.8 (L) 08/07/2023   PLT 522 (H) 08/07/2023       Component Value Date/Time   NA 139 08/07/2023 1409   NA 140 05/20/2012 1049   K 4.0 08/07/2023 1409   K 3.7 05/20/2012 1049   CL 105 08/07/2023 1409   CL 100 05/20/2012 1049   CO2 27 08/07/2023 1409   CO2 29 05/20/2012 1049   GLUCOSE 89 08/07/2023 1409   GLUCOSE 89 05/20/2012 1049   BUN 19 08/07/2023 1409   BUN 19.5 05/20/2012 1049   CREATININE 0.85 08/07/2023 1409   CREATININE 1.2 (H) 05/20/2012 1049   CALCIUM 9.5 08/07/2023 1409   CALCIUM 9.3 05/20/2012 1049   PROT 7.4 08/07/2023 1409   PROT 7.2 05/20/2012 1049   ALBUMIN 4.4 08/07/2023 1409   ALBUMIN 3.7 05/20/2012  1049   AST 21 08/07/2023 1409   AST 20 05/20/2012 1049   ALT 17 08/07/2023 1409   ALT 17 05/20/2012 1049   ALKPHOS 53  08/07/2023 1409   ALKPHOS 45 05/20/2012 1049   BILITOT 0.4 08/07/2023 1409   BILITOT 0.45 05/20/2012 1049   GFRNONAA >60 08/07/2023 1409   GFRNONAA >60 01/30/2012 2242   GFRNONAA >60 04/21/2010 1605   GFRAA >60 05/22/2019 0954   GFRAA >60 01/30/2012 2242   GFRAA >60 04/21/2010 1605    No results found for: SPEP, UPEP    Chemistry      Component Value Date/Time   NA 139 08/07/2023 1409   NA 140 05/20/2012 1049   K 4.0 08/07/2023 1409   K 3.7 05/20/2012 1049   CL 105 08/07/2023 1409   CL 100 05/20/2012 1049   CO2 27 08/07/2023 1409   CO2 29 05/20/2012 1049   BUN 19 08/07/2023 1409   BUN 19.5 05/20/2012 1049   CREATININE 0.85 08/07/2023 1409   CREATININE 1.2 (H) 05/20/2012 1049      Component Value Date/Time   CALCIUM 9.5 08/07/2023 1409   CALCIUM 9.3 05/20/2012 1049   ALKPHOS 53 08/07/2023 1409   ALKPHOS 45 05/20/2012 1049   AST 21 08/07/2023 1409   AST 20 05/20/2012 1049   ALT 17 08/07/2023 1409   ALT 17 05/20/2012 1049   BILITOT 0.4 08/07/2023 1409   BILITOT 0.45 05/20/2012 1049        RADIOGRAPHIC STUDIES:  I have personally reviewed the radiological images as listed and agree with the findings in the report.  IR BONE MARROW BIOPSY & ASPIRATION Result Date: 07/26/2023 INDICATION: Patient with JAK2 mutation positivity, suspected essential thrombocytosis. Please arrange for bone marrow biopsy and aspiration. EXAM: FLUOROSCOPIC GUIDED BONE MARROW BIOPSY AND ASPIRATION MEDICATIONS: 25 mg Benadryl  IV FLUOROSCOPY: Radiation Exposure Index and estimated peak skin dose (PSD); Reference air kerma (RAK), 3 mGy. ANESTHESIA/SEDATION: Moderate (conscious) sedation was employed during this procedure. A total of Versed  2 mg and Fentanyl  100 mcg was administered intravenously. Moderate Sedation Time: 10 minutes. The patient's level of consciousness and vital signs were monitored continuously by radiology nursing throughout the procedure under my direct supervision.  COMPLICATIONS: None immediate. PROCEDURE: Informed consent was obtained from the patient following an explanation of the procedure, risks, benefits and alternatives. The patient understands, agrees and consents for the procedure. All questions were addressed. A time out was performed prior to the initiation of the procedure. The patient was positioned prone on the fluoroscopy table and the posterior aspect of the RIGHT iliac crest was marked fluoroscopically. The operative site was prepped and draped in the usual sterile fashion. Under sterile conditions and local anesthesia, an 11 gauge coaxial bone biopsy needle was advanced into the posterior aspect of the RIGHT iliac marrow space under intermittent fluoroscopic guidance. Multiple fluoroscopic images were saved procedural documentation purposes. Initially, a bone marrow aspiration was performed. Next, a bone marrow biopsy was obtained with the 11 gauge outer bone marrow device. The needle was removed and superficial hemostasis was obtained with manual compression. A dressing was applied. The patient tolerated the procedure well without immediate post procedural complication. IMPRESSION: Successful fluoroscopic-guided RIGHT iliac bone marrow aspiration and core biopsy. Thom Hall, MD [facility] Radiology Electronically Signed   By: Thom Hall M.D.   On: 07/26/2023 12:20     Orders Placed This Encounter  Procedures   CBC with Differential (Cancer Center Only)  Standing Status:   Future    Expected Date:   08/28/2023    Expiration Date:   11/26/2023   CMP (Cancer Center only)    Standing Status:   Future    Expected Date:   08/28/2023    Expiration Date:   11/26/2023   Lactate dehydrogenase    Standing Status:   Future    Expected Date:   08/28/2023    Expiration Date:   11/26/2023     Future Appointments  Date Time Provider Department Center  08/28/2023  3:00 PM [facility]  None  08/28/2023  3:30 PM Chalsea Darko, Chinita, MD [facility] None     This document was completed utilizing speech recognition software. Grammatical errors, random word insertions, pronoun errors, and incomplete sentences are an occasional consequence of this system due to software limitations, ambient noise, and hardware issues. Any formal questions or concerns about the content, text or information contained within the body of this dictation should be directly addressed to the provider for clarification.

## 2023-08-28 ENCOUNTER — Inpatient Hospital Stay: Admitting: Oncology

## 2023-08-28 ENCOUNTER — Inpatient Hospital Stay: Attending: Oncology

## 2023-08-28 ENCOUNTER — Encounter: Payer: Self-pay | Admitting: Oncology

## 2023-08-28 VITALS — BP 139/79 | HR 62 | Temp 97.0°F | Resp 17 | Ht 62.0 in | Wt 146.6 lb

## 2023-08-28 DIAGNOSIS — D75839 Thrombocytosis, unspecified: Secondary | ICD-10-CM | POA: Diagnosis present

## 2023-08-28 DIAGNOSIS — D471 Chronic myeloproliferative disease: Secondary | ICD-10-CM

## 2023-08-28 DIAGNOSIS — D563 Thalassemia minor: Secondary | ICD-10-CM

## 2023-08-28 DIAGNOSIS — D473 Essential (hemorrhagic) thrombocythemia: Secondary | ICD-10-CM | POA: Insufficient documentation

## 2023-08-28 DIAGNOSIS — D45 Polycythemia vera: Secondary | ICD-10-CM | POA: Insufficient documentation

## 2023-08-28 LAB — CMP (CANCER CENTER ONLY)
ALT: 13 U/L (ref 0–44)
AST: 15 U/L (ref 15–41)
Albumin: 4.3 g/dL (ref 3.5–5.0)
Alkaline Phosphatase: 47 U/L (ref 38–126)
Anion gap: 7 (ref 5–15)
BUN: 18 mg/dL (ref 6–20)
CO2: 29 mmol/L (ref 22–32)
Calcium: 9.1 mg/dL (ref 8.9–10.3)
Chloride: 101 mmol/L (ref 98–111)
Creatinine: 0.84 mg/dL (ref 0.44–1.00)
GFR, Estimated: >60 mL/min (ref >60)
Glucose, Bld: 88 mg/dL (ref 70–99)
Potassium: 3.8 mmol/L (ref 3.5–5.1)
Sodium: 137 mmol/L (ref 135–145)
Total Bilirubin: 0.6 mg/dL (ref 0.0–1.2)
Total Protein: 7.2 g/dL (ref 6.5–8.1)

## 2023-08-28 LAB — CBC WITH DIFFERENTIAL (CANCER CENTER ONLY)
Abs Immature Granulocytes: 0.03 K/uL (ref 0.00–0.07)
Basophils Absolute: 0.1 K/uL (ref 0.0–0.1)
Basophils Relative: 1 %
Eosinophils Absolute: 0.1 K/uL (ref 0.0–0.5)
Eosinophils Relative: 2 %
HCT: 41.6 % (ref 36.0–46.0)
Hemoglobin: 12.7 g/dL (ref 12.0–15.0)
Immature Granulocytes: 1 %
Lymphocytes Relative: 44 %
Lymphs Abs: 3 K/uL (ref 0.7–4.0)
MCH: 20.5 pg — ABNORMAL LOW (ref 26.0–34.0)
MCHC: 30.5 g/dL (ref 30.0–36.0)
MCV: 67 fL — ABNORMAL LOW (ref 80.0–100.0)
Monocytes Absolute: 0.6 K/uL (ref 0.1–1.0)
Monocytes Relative: 9 %
Neutro Abs: 2.8 K/uL (ref 1.7–7.7)
Neutrophils Relative %: 43 %
Platelet Count: 462 K/uL — ABNORMAL HIGH (ref 150–400)
RBC: 6.21 MIL/uL — ABNORMAL HIGH (ref 3.87–5.11)
RDW: 20.3 % — ABNORMAL HIGH (ref 11.5–15.5)
WBC Count: 6.6 K/uL (ref 4.0–10.5)
nRBC: 0 % (ref 0.0–0.2)

## 2023-08-28 LAB — LACTATE DEHYDROGENASE: LDH: 197 U/L — ABNORMAL HIGH (ref 98–192)

## 2023-08-28 NOTE — Assessment & Plan Note (Signed)
 Microcytosis with small red blood cell size and slightly elevated total red blood cell count, but normal hemoglobin levels.  Since iron studies showed no evidence of iron deficiency, we proceeded with hemoglobin electrophoresis and alpha thalassemia testing.  Hemoglobin electrophoresis showed no evidence of hemoglobinopathy.  However alpha thalassemia testing did not show evidence of homozygosity for -alpha 3.7 deletion, indicating alpha thalassemia trait.  Alpha thalassemia trait could be masking her polycythemia vera.  Though RBC count is increased, hemoglobin is within normal limits.

## 2023-08-28 NOTE — Assessment & Plan Note (Addendum)
 Thrombocytosis with a platelet count of 500,000, exceeding the normal range of 150,000 to 450,000. Differential diagnosis includes iron deficiency, inflammation, or a bone marrow mutation causing overproduction of platelets. Occasional gum bleeding noted. No splenectomy history.   On her initial consultation with us  on 06/12/2023, labs showed platelet count of 488,000, remains elevated.  White count 8400 with normal differential.  Hemoglobin normal at 13.4, MCV decreased at 66.8.  Iron studies showed no evidence of iron deficiency.  Sed rate normal at 2 mm/h.  CRP normal at 0.5 mg/dL.  LDH was increased at 242.  JAK2 V617F mutation was positive.   On 07/26/2023, she underwent bone marrow biopsy which showed hypercellular bone marrow (70%) involved by JAK2 positive myeloproliferative neoplasm.  Megakaryocytic hyperplasia and atypia and mild expansion of the erythroid lineage was noted.  Overall findings were consistent with primary myeloproliferative neoplasm (ET/PCV).  No increase in blasts or fibrosis.  Conventional cytogenetics showed normal female karyotype, 36 XX.  Today I discussed results of the bone marrow biopsy in detail with the patient.  Explained that we are dealing with primary myeloproliferative neoplasm.   Microcytosis with small red blood cell size and slightly elevated total red blood cell count, but normal hemoglobin levels.  Since iron studies showed no evidence of iron deficiency, we proceeded with hemoglobin electrophoresis and alpha thalassemia testing.  Hemoglobin electrophoresis showed no evidence of hemoglobinopathy.  However alpha thalassemia testing did not show evidence of homozygosity for -alpha 3.7 deletion, indicating alpha thalassemia trait.  Alpha thalassemia trait could be masking her polycythemia vera. Though RBC count is increased, hemoglobin is within normal limits.   R-IPSET score of 2, intermediate risk for thrombosis.  Goal is to maintain platelet count below 400,000  to prevent thrombosis related complications.    Started her on hydroxyurea  500 mg daily from 08/07/2023.  Discussed potential side effects including leg ulcers, fatigue, and nausea, though uncommon at the starting dose. Aspirin 81 mg daily is recommended to manage vasomotor symptoms and reduce clotting risk. The condition is not hereditary but can progress to leukemia if untreated. Treatment aims to prevent leukemic transformation by controlling abnormal marrow cell production.  She has been tolerating hydroxyurea  very well and has not noticed any side effects.  Platelet count decreased from 526,000 to 462,000. Hemoglobin and white blood cell counts are normal.  Plan to continue current dose of hydroxyurea .  RTC in 6 weeks for follow-up with repeat labs.

## 2023-08-28 NOTE — Progress Notes (Signed)
 [facility]  HEMATOLOGY CLINIC PROGRESS NOTE  PATIENT NAME: Kaitlyn Solomon   MR#: [id number] DOB: [birth year]  Patient Care Team: Wannetta Dickey Idell DEVONNA as PCP - General (Physician Assistant)  Date of visit: 08/28/2023   ASSESSMENT & PLAN:   Kaitlyn Solomon is a 58 y.o. lady with a past medical history of hypertension, hypothyroidism, GERD, bipolar disorder, history of subarachnoid hemorrhage in 2019, was referred to our service in April 2025 for evaluation of thrombocytosis and polycythemia.  Workup consistent with myeloproliferative neoplasm with JAK2 V617F mutation positivity. R-IPSET score of 2, intermediate risk for thrombosis   Myeloproliferative neoplasm (HCC) Thrombocytosis with a platelet count of 500,000, exceeding the normal range of 150,000 to 450,000. Differential diagnosis includes iron deficiency, inflammation, or a bone marrow mutation causing overproduction of platelets. Occasional gum bleeding noted. No splenectomy history.   On her initial consultation with us  on 06/12/2023, labs showed platelet count of 488,000, remains elevated.  White count 8400 with normal differential.  Hemoglobin normal at 13.4, MCV decreased at 66.8.  Iron studies showed no evidence of iron deficiency.  Sed rate normal at 2 mm/h.  CRP normal at 0.5 mg/dL.  LDH was increased at 242.  JAK2 V617F mutation was positive.   On 07/26/2023, she underwent bone marrow biopsy which showed hypercellular bone marrow (70%) involved by JAK2 positive myeloproliferative neoplasm.  Megakaryocytic hyperplasia and atypia and mild expansion of the erythroid lineage was noted.  Overall findings were consistent with primary myeloproliferative neoplasm (ET/PCV).  No increase in blasts or fibrosis.  Conventional cytogenetics showed normal female karyotype, 5 XX.  Today I discussed results of the bone marrow biopsy in detail with the patient.  Explained that we are dealing with primary myeloproliferative  neoplasm.   Microcytosis with small red blood cell size and slightly elevated total red blood cell count, but normal hemoglobin levels.  Since iron studies showed no evidence of iron deficiency, we proceeded with hemoglobin electrophoresis and alpha thalassemia testing.  Hemoglobin electrophoresis showed no evidence of hemoglobinopathy.  However alpha thalassemia testing did not show evidence of homozygosity for -alpha 3.7 deletion, indicating alpha thalassemia trait.  Alpha thalassemia trait could be masking her polycythemia vera. Though RBC count is increased, hemoglobin is within normal limits.   R-IPSET score of 2, intermediate risk for thrombosis.  Goal is to maintain platelet count below 400,000 to prevent thrombosis related complications.    Started her on hydroxyurea  500 mg daily from 08/07/2023.  Discussed potential side effects including leg ulcers, fatigue, and nausea, though uncommon at the starting dose. Aspirin 81 mg daily is recommended to manage vasomotor symptoms and reduce clotting risk. The condition is not hereditary but can progress to leukemia if untreated. Treatment aims to prevent leukemic transformation by controlling abnormal marrow cell production.  She has been tolerating hydroxyurea  very well and has not noticed any side effects.  Platelet count decreased from 526,000 to 462,000. Hemoglobin and white blood cell counts are normal.  Plan to continue current dose of hydroxyurea .  RTC in 6 weeks for follow-up with repeat labs.  Alpha thalassaemia minor Microcytosis with small red blood cell size and slightly elevated total red blood cell count, but normal hemoglobin levels.  Since iron studies showed no evidence of iron deficiency, we proceeded with hemoglobin electrophoresis and alpha thalassemia testing.  Hemoglobin electrophoresis showed no evidence of hemoglobinopathy.  However alpha thalassemia testing did not show evidence of homozygosity for -alpha 3.7 deletion,  indicating alpha thalassemia trait.  Alpha thalassemia trait could be masking her polycythemia vera.  Though RBC count is increased, hemoglobin is within normal limits.   I spent a total of 20 minutes during this encounter with the patient including review of chart and various tests results, discussions about plan of care and coordination of care plan.  I reviewed lab results and outside records for this visit and discussed relevant results with the patient. Diagnosis, plan of care and treatment options were also discussed in detail with the patient. Opportunity provided to ask questions and answers provided to her apparent satisfaction. Provided instructions to call our clinic with any problems, questions or concerns prior to return visit. I recommended to continue follow-up with PCP and sub-specialists. She verbalized understanding and agreed with the plan. No barriers to learning was detected.  Chinita Patten, MD  08/28/2023 11:27 PM  [facility] - A DEPT OF [facility] [address] [place] [address] Dept: [phone] Dept Fax: [phone]   CHIEF COMPLAINT/ REASON FOR VISIT:  Myeloproliferative neoplasm (essential thrombocythemia/polycythemia vera), diagnosed in June 2025, JAK2 V617F mutation positive.  INTERVAL HISTORY:  Discussed the use of AI scribe software for clinical note transcription with the patient, who gave verbal consent to proceed.  History of Present Illness  Kaitlyn Solomon is a 58 year old female with essential thrombocythemia who presents for follow-up on Hydrea  treatment.  She has been taking Hydrea  500 mg once daily for the past three weeks without experiencing any side effects. Her platelet count has decreased from a previous high of 526,000 to 462,000. Her hemoglobin and white blood cell counts are within normal ranges, and her electrolytes, kidney, and liver function tests are also  normal.  She inquired about the hereditary nature of her condition and was informed that it is not hereditary and is not exactly leukemia but a pre-leukemic condition.  She has enough medication with three refills available.   SUMMARY OF HEMATOLOGIC HISTORY:  On 02/01/2023, labs at her PCPs office showed hemoglobin of 13.3, hematocrit 44.9, MCV 68.800.  Platelet count was 500,000.  White count 7700 with normal differential.  Red blood cell count was elevated at 6.5 million.  Iron studies were grossly within normal limits.  Thyroid  function tests were normal as well.   On 05/06/2023, repeat labs showed platelet count of 06/21/1998.  Hemoglobin 13.6, hematocrit 46.2, MCV 68.2.  White count 9800 with normal differential.  She was referred to us  for further evaluation of thrombocytosis, microcytosis.   She has a history of heavy menstrual cycles, which led to a partial hysterectomy. She no longer experiences menstrual cycles. She reports occasional gum bleeding but denies any other bleeding issues such as blood in stools, black stools, or epistaxis.   There is no known family history of sickle cell disease, and she has not had any surgeries on her spleen. She does not smoke and consumes alcohol occasionally. No unintentional weight loss. She is due for a colonoscopy, as it has been more than five years since her last screening. She is up to date with her mammogram screenings.   Patient has never been diagnosed with thrombotic events.   Differential diagnosis includes iron deficiency, inflammation, or a bone marrow mutation causing overproduction of platelets. Occasional gum bleeding noted.    On her initial consultation with us  on 06/12/2023, labs showed platelet count of 488,000, remains elevated.  White count 8400 with normal differential.  Hemoglobin normal at  13.4, MCV decreased at 66.8.  Iron studies showed no evidence of iron deficiency.  Sed rate normal at 2 mm/h.  CRP normal at 0.5 mg/dL.  LDH  was increased at 242.  JAK2 V617F mutation was positive.    On 07/26/2023, she underwent bone marrow biopsy which showed hypercellular bone marrow (70%) involved by JAK2 positive myeloproliferative neoplasm.  Megakaryocytic hyperplasia and atypia and mild expansion of the erythroid lineage was noted.  Overall findings were consistent with primary myeloproliferative neoplasm (ET/PCV).  No increase in blasts or fibrosis.  Conventional cytogenetics showed normal female karyotype, 33 XX.    Microcytosis with small red blood cell size and slightly elevated total red blood cell count, but normal hemoglobin levels.  Since iron studies showed no evidence of iron deficiency, we proceeded with hemoglobin electrophoresis and alpha thalassemia testing.  Hemoglobin electrophoresis showed no evidence of hemoglobinopathy.  However alpha thalassemia testing did not show evidence of homozygosity for -alpha 3.7 deletion, indicating alpha thalassemia trait.  Alpha thalassemia trait could be masking her polycythemia vera. Though RBC count is increased, hemoglobin is within normal limits.   R-IPSET score of 2, intermediate risk for thrombosis.  Goal is to maintain platelet count below 400,000 to prevent thrombosis related complications.    Started her on hydroxyurea  500 mg daily from 08/07/2023.  I have reviewed the past medical history, past surgical history, social history and family history with the patient and they are unchanged from previous note.  ALLERGIES: She is allergic to lamotrigine and codeine.  MEDICATIONS:  Current Outpatient Medications  Medication Sig Dispense Refill   amLODipine  (NORVASC ) 10 MG tablet TAKE 1 TABLET(10 MG) BY MOUTH DAILY 90 tablet 0   cholecalciferol (VITAMIN D ) 1000 UNITS tablet Take 1,000 Units by mouth daily.     FLUoxetine (PROZAC) 40 MG capsule Take 40 mg by mouth daily.     hydroxyurea  (HYDREA ) 500 MG capsule Take 1 capsule (500 mg total) by mouth daily. May take with food to  minimize GI side effects. 30 capsule 3   levothyroxine  (SYNTHROID ) 88 MCG tablet Take 88 mcg by mouth daily.     Multiple Vitamin (MULTIVITAMIN) tablet Take 1 tablet by mouth daily.     pantoprazole (PROTONIX) 20 MG tablet Take 20 mg by mouth daily as needed for heartburn or indigestion.     No current facility-administered medications for this visit.     REVIEW OF SYSTEMS:    Review of Systems - Oncology  All other pertinent systems were reviewed with the patient and are negative.  PHYSICAL EXAMINATION:    Onc Performance Status - 08/28/23 1534       ECOG Perf Status   ECOG Perf Status Fully active, able to carry on all pre-disease performance without restriction      KPS SCALE   KPS % SCORE Able to carry on normal activity, minor s/s of disease          Vitals:   08/28/23 1531 08/28/23 1532  BP: (!) 149/94 139/79  Pulse: 62   Resp: 17   Temp: (!) 97 F (36.1 C)   SpO2: 100%    Filed Weights   08/28/23 1531  Weight: 146 lb 9.6 oz (66.5 kg)    Physical Exam Constitutional:      General: She is not in acute distress.    Appearance: Normal appearance.  HENT:     Head: Normocephalic and atraumatic.  Cardiovascular:     Rate and Rhythm: Normal rate and regular rhythm.  Heart sounds: Normal heart sounds.  Pulmonary:     Effort: Pulmonary effort is normal. No respiratory distress.     Breath sounds: Normal breath sounds.  Abdominal:     General: There is no distension.     Palpations: There is no mass.  Neurological:     General: No focal deficit present.     Mental Status: She is alert and oriented to person, place, and time.  Psychiatric:        Mood and Affect: Mood normal.        Behavior: Behavior normal.     LABORATORY DATA:   I have reviewed the data as listed.  Results for orders placed or performed in visit on 08/28/23  Lactate dehydrogenase  Result Value Ref Range   LDH 197 (H) 98 - 192 U/L  CMP (Cancer Center only)  Result Value Ref  Range   Sodium 137 135 - 145 mmol/L   Potassium 3.8 3.5 - 5.1 mmol/L   Chloride 101 98 - 111 mmol/L   CO2 29 22 - 32 mmol/L   Glucose, Bld 88 70 - 99 mg/dL   BUN 18 6 - 20 mg/dL   Creatinine 9.15 9.55 - 1.00 mg/dL   Calcium 9.1 8.9 - 89.6 mg/dL   Total Protein 7.2 6.5 - 8.1 g/dL   Albumin 4.3 3.5 - 5.0 g/dL   AST 15 15 - 41 U/L   ALT 13 0 - 44 U/L   Alkaline Phosphatase 47 38 - 126 U/L   Total Bilirubin 0.6 0.0 - 1.2 mg/dL   GFR, Estimated >39 >39 mL/min   Anion gap 7 5 - 15  CBC with Differential (Cancer Center Only)  Result Value Ref Range   WBC Count 6.6 4.0 - 10.5 K/uL   RBC 6.21 (H) 3.87 - 5.11 MIL/uL   Hemoglobin 12.7 12.0 - 15.0 g/dL   HCT 58.3 63.9 - 53.9 %   MCV 67.0 (L) 80.0 - 100.0 fL   MCH 20.5 (L) 26.0 - 34.0 pg   MCHC 30.5 30.0 - 36.0 g/dL   RDW 79.6 (H) 88.4 - 84.4 %   Platelet Count 462 (H) 150 - 400 K/uL   nRBC 0.0 0.0 - 0.2 %   Neutrophils Relative % 43 %   Neutro Abs 2.8 1.7 - 7.7 K/uL   Lymphocytes Relative 44 %   Lymphs Abs 3.0 0.7 - 4.0 K/uL   Monocytes Relative 9 %   Monocytes Absolute 0.6 0.1 - 1.0 K/uL   Eosinophils Relative 2 %   Eosinophils Absolute 0.1 0.0 - 0.5 K/uL   Basophils Relative 1 %   Basophils Absolute 0.1 0.0 - 0.1 K/uL   Immature Granulocytes 1 %   Abs Immature Granulocytes 0.03 0.00 - 0.07 K/uL     RADIOGRAPHIC STUDIES:  No recent pertinent imaging available to review.   Orders Placed This Encounter  Procedures   CBC with Differential (Cancer Center Only)    Standing Status:   Standing    Number of Occurrences:   6    Expiration Date:   08/27/2024   CMP (Cancer Center only)    Standing Status:   Standing    Number of Occurrences:   6    Expiration Date:   08/27/2024   Lactate dehydrogenase    Standing Status:   Standing    Number of Occurrences:   6    Expiration Date:   08/27/2024      This document was completed utilizing  speech recognition software. Grammatical errors, random word insertions, pronoun errors,  and incomplete sentences are an occasional consequence of this system due to software limitations, ambient noise, and hardware issues. Any formal questions or concerns about the content, text or information contained within the body of this dictation should be directly addressed to the provider for clarification.

## 2023-10-09 ENCOUNTER — Inpatient Hospital Stay: Attending: Oncology

## 2023-10-09 ENCOUNTER — Other Ambulatory Visit

## 2023-10-09 ENCOUNTER — Encounter: Payer: Self-pay | Admitting: Oncology

## 2023-10-09 ENCOUNTER — Ambulatory Visit: Admitting: Internal Medicine

## 2023-10-09 ENCOUNTER — Inpatient Hospital Stay (HOSPITAL_BASED_OUTPATIENT_CLINIC_OR_DEPARTMENT_OTHER): Admitting: Oncology

## 2023-10-09 VITALS — BP 141/89 | HR 56 | Temp 97.8°F | Resp 18 | Ht 62.0 in | Wt 146.2 lb

## 2023-10-09 DIAGNOSIS — D471 Chronic myeloproliferative disease: Secondary | ICD-10-CM

## 2023-10-09 DIAGNOSIS — D45 Polycythemia vera: Secondary | ICD-10-CM | POA: Insufficient documentation

## 2023-10-09 DIAGNOSIS — D473 Essential (hemorrhagic) thrombocythemia: Secondary | ICD-10-CM | POA: Diagnosis present

## 2023-10-09 DIAGNOSIS — Z7982 Long term (current) use of aspirin: Secondary | ICD-10-CM | POA: Diagnosis not present

## 2023-10-09 DIAGNOSIS — D563 Thalassemia minor: Secondary | ICD-10-CM | POA: Diagnosis not present

## 2023-10-09 LAB — CMP (CANCER CENTER ONLY)
ALT: 14 U/L (ref 0–44)
AST: 15 U/L (ref 15–41)
Albumin: 4 g/dL (ref 3.5–5.0)
Alkaline Phosphatase: 44 U/L (ref 38–126)
Anion gap: 4 — ABNORMAL LOW (ref 5–15)
BUN: 17 mg/dL (ref 6–20)
CO2: 31 mmol/L (ref 22–32)
Calcium: 8.9 mg/dL (ref 8.9–10.3)
Chloride: 104 mmol/L (ref 98–111)
Creatinine: 0.86 mg/dL (ref 0.44–1.00)
GFR, Estimated: >60 mL/min (ref >60)
Glucose, Bld: 91 mg/dL (ref 70–99)
Potassium: 3.8 mmol/L (ref 3.5–5.1)
Sodium: 139 mmol/L (ref 135–145)
Total Bilirubin: 0.4 mg/dL (ref 0.0–1.2)
Total Protein: 6.6 g/dL (ref 6.5–8.1)

## 2023-10-09 LAB — CBC WITH DIFFERENTIAL (CANCER CENTER ONLY)
Abs Immature Granulocytes: 0.01 K/uL (ref 0.00–0.07)
Basophils Absolute: 0.1 K/uL (ref 0.0–0.1)
Basophils Relative: 1 %
Eosinophils Absolute: 0.1 K/uL (ref 0.0–0.5)
Eosinophils Relative: 2 %
HCT: 39.4 % (ref 36.0–46.0)
Hemoglobin: 12.1 g/dL (ref 12.0–15.0)
Immature Granulocytes: 0 %
Lymphocytes Relative: 53 %
Lymphs Abs: 3.3 K/uL (ref 0.7–4.0)
MCH: 21.3 pg — ABNORMAL LOW (ref 26.0–34.0)
MCHC: 30.7 g/dL (ref 30.0–36.0)
MCV: 69.5 fL — ABNORMAL LOW (ref 80.0–100.0)
Monocytes Absolute: 0.5 K/uL (ref 0.1–1.0)
Monocytes Relative: 8 %
Neutro Abs: 2.3 K/uL (ref 1.7–7.7)
Neutrophils Relative %: 36 %
Platelet Count: 331 K/uL (ref 150–400)
RBC: 5.67 MIL/uL — ABNORMAL HIGH (ref 3.87–5.11)
RDW: 21.3 % — ABNORMAL HIGH (ref 11.5–15.5)
WBC Count: 6.3 K/uL (ref 4.0–10.5)
nRBC: 0 % (ref 0.0–0.2)

## 2023-10-09 LAB — LACTATE DEHYDROGENASE: LDH: 156 U/L (ref 98–192)

## 2023-10-09 MED ORDER — ASPIRIN 81 MG PO TBEC: 81.0000 mg | DELAYED_RELEASE_TABLET | Freq: Every day | ORAL | Status: AC

## 2023-10-09 NOTE — Assessment & Plan Note (Addendum)
 Thrombocytosis with a platelet count of 500,000, exceeding the normal range of 150,000 to 450,000. Differential diagnosis includes iron deficiency, inflammation, or a bone marrow mutation causing overproduction of platelets. Occasional gum bleeding noted. No splenectomy history.   On her initial consultation with us  on 06/12/2023, labs showed platelet count of 488,000, remains elevated.  White count 8400 with normal differential.  Hemoglobin normal at 13.4, MCV decreased at 66.8.  Iron studies showed no evidence of iron deficiency.  Sed rate normal at 2 mm/h.  CRP normal at 0.5 mg/dL.  LDH was increased at 242.  JAK2 V617F mutation was positive.   On 07/26/2023, she underwent bone marrow biopsy which showed hypercellular bone marrow (70%) involved by JAK2 positive myeloproliferative neoplasm.  Megakaryocytic hyperplasia and atypia and mild expansion of the erythroid lineage was noted.  Overall findings were consistent with primary myeloproliferative neoplasm (ET/PCV).  No increase in blasts or fibrosis.  Conventional cytogenetics showed normal female karyotype, 77 XX.  Previously I discussed results of the bone marrow biopsy in detail with the patient.  Explained that we are dealing with primary myeloproliferative neoplasm.   Microcytosis with small red blood cell size and slightly elevated total red blood cell count, but normal hemoglobin levels.  Since iron studies showed no evidence of iron deficiency, we proceeded with hemoglobin electrophoresis and alpha thalassemia testing.  Hemoglobin electrophoresis showed no evidence of hemoglobinopathy.  However alpha thalassemia testing did show evidence of homozygosity for -alpha 3.7 deletion, indicating alpha thalassemia trait.  Alpha thalassemia trait could be masking her polycythemia vera. Though RBC count is increased, hemoglobin is within normal limits.   R-IPSET score of 2, intermediate risk for thrombosis.  Goal is to maintain platelet count below  400,000 to prevent thrombosis related complications.    Started her on hydroxyurea  500 mg daily from 08/07/2023.  Discussed potential side effects including leg ulcers, fatigue, and nausea, though uncommon at the starting dose. Aspirin  81 mg daily is recommended to manage vasomotor symptoms and reduce clotting risk. The condition is not hereditary but can progress to leukemia if untreated. Treatment aims to prevent leukemic transformation by controlling abnormal marrow cell production.  She has been experiencing some side effects from Hydrea .  Current adverse effects include tiredness, night sweats, insomnia, and leg cramps. Platelet count improved from 526,000 to 331,000, achieving the goal of below 400,000. Other blood counts, kidney, and liver function tests are normal. Night sweats and insomnia are new since starting hydroxyurea . Leg cramps may be related to electrolyte imbalance due to high water intake without electrolyte supplementation. - Trial hydroxyurea  500 mg every other day for two weeks to assess impact on symptoms. - Supplement one bottle of water with electrolytes daily to address potential electrolyte imbalance and leg cramps. - Continue baby aspirin  once daily.  - Follow up in six weeks to reassess symptoms and blood counts.

## 2023-10-09 NOTE — Progress Notes (Signed)
 [facility]  HEMATOLOGY CLINIC PROGRESS NOTE  PATIENT NAME: Kaitlyn Solomon   MR#: [id number] DOB: 06-17-1965  Patient Care Team: Wannetta Dickey Idell DEVONNA as PCP - General (Physician Assistant)  Date of visit: 10/09/2023   ASSESSMENT & PLAN:   Kaitlyn Solomon is a 58 y.o. lady with a past medical history of hypertension, hypothyroidism, GERD, bipolar disorder, history of subarachnoid hemorrhage in 2019, was referred to our service in April 2025 for evaluation of thrombocytosis and polycythemia.  Workup consistent with myeloproliferative neoplasm with JAK2 V617F mutation positivity. R-IPSET score of 2, intermediate risk for thrombosis   Myeloproliferative neoplasm (HCC) Thrombocytosis with a platelet count of 500,000, exceeding the normal range of 150,000 to 450,000. Differential diagnosis includes iron deficiency, inflammation, or a bone marrow mutation causing overproduction of platelets. Occasional gum bleeding noted. No splenectomy history.   On her initial consultation with us  on 06/12/2023, labs showed platelet count of 488,000, remains elevated.  White count 8400 with normal differential.  Hemoglobin normal at 13.4, MCV decreased at 66.8.  Iron studies showed no evidence of iron deficiency.  Sed rate normal at 2 mm/h.  CRP normal at 0.5 mg/dL.  LDH was increased at 242.  JAK2 V617F mutation was positive.   On 07/26/2023, she underwent bone marrow biopsy which showed hypercellular bone marrow (70%) involved by JAK2 positive myeloproliferative neoplasm.  Megakaryocytic hyperplasia and atypia and mild expansion of the erythroid lineage was noted.  Overall findings were consistent with primary myeloproliferative neoplasm (ET/PCV).  No increase in blasts or fibrosis.  Conventional cytogenetics showed normal female karyotype, 17 XX.  Previously I discussed results of the bone marrow biopsy in detail with the patient.  Explained that we are dealing with primary  myeloproliferative neoplasm.   Microcytosis with small red blood cell size and slightly elevated total red blood cell count, but normal hemoglobin levels.  Since iron studies showed no evidence of iron deficiency, we proceeded with hemoglobin electrophoresis and alpha thalassemia testing.  Hemoglobin electrophoresis showed no evidence of hemoglobinopathy.  However alpha thalassemia testing did show evidence of homozygosity for -alpha 3.7 deletion, indicating alpha thalassemia trait.  Alpha thalassemia trait could be masking her polycythemia vera. Though RBC count is increased, hemoglobin is within normal limits.   R-IPSET score of 2, intermediate risk for thrombosis.  Goal is to maintain platelet count below 400,000 to prevent thrombosis related complications.    Started her on hydroxyurea  500 mg daily from 08/07/2023.  Discussed potential side effects including leg ulcers, fatigue, and nausea, though uncommon at the starting dose. Aspirin  81 mg daily is recommended to manage vasomotor symptoms and reduce clotting risk. The condition is not hereditary but can progress to leukemia if untreated. Treatment aims to prevent leukemic transformation by controlling abnormal marrow cell production.  She has been experiencing some side effects from Hydrea .  Current adverse effects include tiredness, night sweats, insomnia, and leg cramps. Platelet count improved from 526,000 to 331,000, achieving the goal of below 400,000. Other blood counts, kidney, and liver function tests are normal. Night sweats and insomnia are new since starting hydroxyurea . Leg cramps may be related to electrolyte imbalance due to high water intake without electrolyte supplementation. - Trial hydroxyurea  500 mg every other day for two weeks to assess impact on symptoms. - Supplement one bottle of water with electrolytes daily to address potential electrolyte imbalance and leg cramps. - Continue baby aspirin  once daily.  - Follow up in six  weeks to reassess symptoms  and blood counts.  Alpha thalassaemia minor Microcytosis with small red blood cell size and slightly elevated total red blood cell count, but normal hemoglobin levels.  Since iron studies showed no evidence of iron deficiency, we proceeded with hemoglobin electrophoresis and alpha thalassemia testing.  Hemoglobin electrophoresis showed no evidence of hemoglobinopathy.  However alpha thalassemia testing did show evidence of homozygosity for -alpha 3.7 deletion, indicating alpha thalassemia trait.  Alpha thalassemia trait could be masking her polycythemia vera.  Though RBC count is increased, hemoglobin is within normal limits.    I spent a total of 27 minutes during this encounter with the patient including review of chart and various tests results, discussions about plan of care and coordination of care plan.  I reviewed lab results and outside records for this visit and discussed relevant results with the patient. Diagnosis, plan of care and treatment options were also discussed in detail with the patient. Opportunity provided to ask questions and answers provided to her apparent satisfaction. Provided instructions to call our clinic with any problems, questions or concerns prior to return visit. I recommended to continue follow-up with PCP and sub-specialists. She verbalized understanding and agreed with the plan. No barriers to learning was detected.  Chinita Patten, MD  10/09/2023 3:43 PM  [facility] - [facility] DE[facility] [address] [place] [address] Dept: [phone] Dept Fax: [phone]   CHIEF COMPLAINT/ REASON FOR VISIT:  Myeloproliferative neoplasm (essential thrombocythemia/polycythemia vera), diagnosed in June 2025, JAK2 V617F mutation positive.  INTERVAL HISTORY:  Discussed the use of AI scribe software for clinical note transcription with the patient, who gave verbal  consent to proceed.  History of Present Illness  Kaitlyn Solomon is a 58 year old female with essential thrombocythemia who presents with side effects from Hydrea .  She has been experiencing side effects since starting Hydrea , including new onset night sweats and worsening difficulty sleeping. She has a history of insomnia, previously managed with Prozac for racing thoughts, which improved her sleep. However, since initiating Hydrea , her sleep disturbances have intensified.  She also experiences leg cramps. She maintains hydration by drinking approximately five 16-ounce bottles of water daily but does not regularly consume electrolytes. She occasionally drinks Gatorade but primarily consumes plain water.  She works night shifts at a [employer], involving significant physical activity, including walking between 9,000 to 15,000 steps per night. Her work schedule consists of night shifts, which she has maintained for many years.  She initially experienced a loss of appetite upon starting Hydrea , which has since improved. She is currently taking baby aspirin  once a day.   SUMMARY OF HEMATOLOGIC HISTORY:  On 02/01/2023, labs at her PCPs office showed hemoglobin of 13.3, hematocrit 44.9, MCV 68.800.  Platelet count was 500,000.  White count 7700 with normal differential.  Red blood cell count was elevated at 6.5 million.  Iron studies were grossly within normal limits.  Thyroid  function tests were normal as well.   On 05/06/2023, repeat labs showed platelet count of 06/21/1998.  Hemoglobin 13.6, hematocrit 46.2, MCV 68.2.  White count 9800 with normal differential.  She was referred to us  for further evaluation of thrombocytosis, microcytosis.   She has a history of heavy menstrual cycles, which led to a partial hysterectomy. She no longer experiences menstrual cycles. She reports occasional gum bleeding but denies any other bleeding issues such as blood in stools, black stools, or epistaxis.  There is no known family history of sickle cell disease, and she has not had any surgeries on her spleen. She does not smoke and consumes alcohol occasionally. No unintentional weight loss. She is due for a colonoscopy, as it has been more than five years since her last screening. She is up to date with her mammogram screenings.   Patient has never been diagnosed with thrombotic events.   Differential diagnosis includes iron deficiency, inflammation, or a bone marrow mutation causing overproduction of platelets. Occasional gum bleeding noted.    On her initial consultation with us  on 06/12/2023, labs showed platelet count of 488,000, remains elevated.  White count 8400 with normal differential.  Hemoglobin normal at 13.4, MCV decreased at 66.8.  Iron studies showed no evidence of iron deficiency.  Sed rate normal at 2 mm/h.  CRP normal at 0.5 mg/dL.  LDH was increased at 242.  JAK2 V617F mutation was positive.    On 07/26/2023, she underwent bone marrow biopsy which showed hypercellular bone marrow (70%) involved by JAK2 positive myeloproliferative neoplasm.  Megakaryocytic hyperplasia and atypia and mild expansion of the erythroid lineage was noted.  Overall findings were consistent with primary myeloproliferative neoplasm (ET/PCV).  No increase in blasts or fibrosis.  Conventional cytogenetics showed normal female karyotype, 7 XX.    Microcytosis with small red blood cell size and slightly elevated total red blood cell count, but normal hemoglobin levels.  Since iron studies showed no evidence of iron deficiency, we proceeded with hemoglobin electrophoresis and alpha thalassemia testing.  Hemoglobin electrophoresis showed no evidence of hemoglobinopathy.  However alpha thalassemia testing did show evidence of homozygosity for -alpha 3.7 deletion, indicating alpha thalassemia trait.  Alpha thalassemia trait could be masking her polycythemia vera. Though RBC count is increased, hemoglobin is within  normal limits.   R-IPSET score of 2, intermediate risk for thrombosis.  Goal is to maintain platelet count below 400,000 to prevent thrombosis related complications.    Started her on hydroxyurea  500 mg daily from 08/07/2023.  Because of side effects of leg cramps, night sweats, decreased appetite, dose reduced Hydrea  to 500 mg every other day starting from 10/09/2023.  I have reviewed the past medical history, past surgical history, social history and family history with the patient and they are unchanged from previous note.  ALLERGIES: She is allergic to lamotrigine and codeine.  MEDICATIONS:  Current Outpatient Medications  Medication Sig Dispense Refill   amLODipine  (NORVASC ) 10 MG tablet TAKE 1 TABLET(10 MG) BY MOUTH DAILY 90 tablet 0   aspirin  EC 81 MG tablet Take 1 tablet (81 mg total) by mouth daily. Swallow whole.     cholecalciferol (VITAMIN D ) 1000 UNITS tablet Take 1,000 Units by mouth daily.     hydroxyurea  (HYDREA ) 500 MG capsule Take 1 capsule (500 mg total) by mouth daily. May take with food to minimize GI side effects. 30 capsule 3   levothyroxine  (SYNTHROID ) 88 MCG tablet Take 88 mcg by mouth daily.     Multiple Vitamin (MULTIVITAMIN) tablet Take 1 tablet by mouth daily.     pantoprazole (PROTONIX) 20 MG tablet Take 20 mg by mouth daily as needed for heartburn or indigestion.     No current facility-administered medications for this visit.     REVIEW OF SYSTEMS:    Review of Systems - Oncology  All other pertinent systems were reviewed with the patient and are negative.  PHYSICAL EXAMINATION:    Onc Performance Status - 10/09/23 1441  ECOG Perf Status   ECOG Perf Status Restricted in physically strenuous activity but ambulatory and able to carry out work of a light or sedentary nature, e.g., light house work, office work      KPS SCALE   KPS % SCORE Normal activity with effort, some s/s of disease           Vitals:   10/09/23 1427  BP: (!)  141/89  Pulse: (!) 56  Resp: 18  Temp: 97.8 F (36.6 C)  SpO2: 99%   Filed Weights   10/09/23 1427  Weight: 146 lb 3.2 oz (66.3 kg)    Physical Exam Constitutional:      General: She is not in acute distress.    Appearance: Normal appearance.  HENT:     Head: Normocephalic and atraumatic.  Cardiovascular:     Rate and Rhythm: Normal rate and regular rhythm.     Heart sounds: Normal heart sounds.  Pulmonary:     Effort: Pulmonary effort is normal. No respiratory distress.     Breath sounds: Normal breath sounds.  Abdominal:     General: There is no distension.     Palpations: There is no mass.  Neurological:     General: No focal deficit present.     Mental Status: She is alert and oriented to person, place, and time.  Psychiatric:        Mood and Affect: Mood normal.        Behavior: Behavior normal.     LABORATORY DATA:   I have reviewed the data as listed.  Results for orders placed or performed in visit on 10/09/23  Lactate dehydrogenase  Result Value Ref Range   LDH 156 98 - 192 U/L  CMP (Cancer Center only)  Result Value Ref Range   Sodium 139 135 - 145 mmol/L   Potassium 3.8 3.5 - 5.1 mmol/L   Chloride 104 98 - 111 mmol/L   CO2 31 22 - 32 mmol/L   Glucose, Bld 91 70 - 99 mg/dL   BUN 17 6 - 20 mg/dL   Creatinine 9.13 9.55 - 1.00 mg/dL   Calcium 8.9 8.9 - 89.6 mg/dL   Total Protein 6.6 6.5 - 8.1 g/dL   Albumin 4.0 3.5 - 5.0 g/dL   AST 15 15 - 41 U/L   ALT 14 0 - 44 U/L   Alkaline Phosphatase 44 38 - 126 U/L   Total Bilirubin 0.4 0.0 - 1.2 mg/dL   GFR, Estimated >39 >39 mL/min   Anion gap 4 (L) 5 - 15  CBC with Differential (Cancer Center Only)  Result Value Ref Range   WBC Count 6.3 4.0 - 10.5 K/uL   RBC 5.67 (H) 3.87 - 5.11 MIL/uL   Hemoglobin 12.1 12.0 - 15.0 g/dL   HCT 60.5 63.9 - 53.9 %   MCV 69.5 (L) 80.0 - 100.0 fL   MCH 21.3 (L) 26.0 - 34.0 pg   MCHC 30.7 30.0 - 36.0 g/dL   RDW 78.6 (H) 88.4 - 84.4 %   Platelet Count 331 150 - 400  K/uL   nRBC 0.0 0.0 - 0.2 %   Neutrophils Relative % 36 %   Neutro Abs 2.3 1.7 - 7.7 K/uL   Lymphocytes Relative 53 %   Lymphs Abs 3.3 0.7 - 4.0 K/uL   Monocytes Relative 8 %   Monocytes Absolute 0.5 0.1 - 1.0 K/uL   Eosinophils Relative 2 %   Eosinophils Absolute 0.1 0.0 - 0.5 K/uL  Basophils Relative 1 %   Basophils Absolute 0.1 0.0 - 0.1 K/uL   Immature Granulocytes 0 %   Abs Immature Granulocytes 0.01 0.00 - 0.07 K/uL     RADIOGRAPHIC STUDIES:  No recent pertinent imaging available to review.   Orders Placed This Encounter  Procedures   CBC with Differential (Cancer Center Only)    Standing Status:   Standing    Number of Occurrences:   6    Expiration Date:   10/08/2024   CMP (Cancer Center only)    Standing Status:   Standing    Number of Occurrences:   6    Expiration Date:   10/08/2024   Lactate dehydrogenase    Standing Status:   Standing    Number of Occurrences:   6    Expiration Date:   10/08/2024      This document was completed utilizing speech recognition software. Grammatical errors, random word insertions, pronoun errors, and incomplete sentences are an occasional consequence of this system due to software limitations, ambient noise, and hardware issues. Any formal questions or concerns about the content, text or information contained within the body of this dictation should be directly addressed to the provider for clarification.

## 2023-10-09 NOTE — Assessment & Plan Note (Signed)
 Microcytosis with small red blood cell size and slightly elevated total red blood cell count, but normal hemoglobin levels.  Since iron studies showed no evidence of iron deficiency, we proceeded with hemoglobin electrophoresis and alpha thalassemia testing.  Hemoglobin electrophoresis showed no evidence of hemoglobinopathy.  However alpha thalassemia testing did show evidence of homozygosity for -alpha 3.7 deletion, indicating alpha thalassemia trait.  Alpha thalassemia trait could be masking her polycythemia vera.  Though RBC count is increased, hemoglobin is within normal limits.

## 2023-10-10 ENCOUNTER — Telehealth: Payer: Self-pay | Admitting: Oncology

## 2023-10-10 ENCOUNTER — Ambulatory Visit: Admitting: Oncology

## 2023-10-10 ENCOUNTER — Other Ambulatory Visit

## 2023-10-10 NOTE — Telephone Encounter (Signed)
 I spoke with [facility] and she has scheduled her 6 week follow up appointment.

## 2023-10-30 ENCOUNTER — Telehealth: Payer: Self-pay

## 2023-10-30 NOTE — Telephone Encounter (Signed)
 Notified the pt regarding her FLA forms being  Completed,faxed,and confirmation received.Pt state that she will pick up her hard copy. No Questions or concerns at this time.

## 2023-11-20 ENCOUNTER — Inpatient Hospital Stay

## 2023-11-20 ENCOUNTER — Inpatient Hospital Stay: Attending: Oncology | Admitting: Oncology

## 2023-11-20 ENCOUNTER — Encounter: Payer: Self-pay | Admitting: Oncology

## 2023-11-20 VITALS — BP 126/83 | HR 71 | Temp 97.2°F | Resp 16 | Ht 62.0 in | Wt 149.0 lb

## 2023-11-20 DIAGNOSIS — Z7964 Long term (current) use of myelosuppressive agent: Secondary | ICD-10-CM | POA: Insufficient documentation

## 2023-11-20 DIAGNOSIS — Z7982 Long term (current) use of aspirin: Secondary | ICD-10-CM | POA: Insufficient documentation

## 2023-11-20 DIAGNOSIS — D471 Chronic myeloproliferative disease: Secondary | ICD-10-CM | POA: Diagnosis not present

## 2023-11-20 DIAGNOSIS — D563 Thalassemia minor: Secondary | ICD-10-CM | POA: Insufficient documentation

## 2023-11-20 DIAGNOSIS — D75839 Thrombocytosis, unspecified: Secondary | ICD-10-CM | POA: Diagnosis not present

## 2023-11-20 DIAGNOSIS — K068 Other specified disorders of gingiva and edentulous alveolar ridge: Secondary | ICD-10-CM | POA: Insufficient documentation

## 2023-11-20 LAB — CBC WITH DIFFERENTIAL (CANCER CENTER ONLY)
Abs Immature Granulocytes: 0.02 K/uL (ref 0.00–0.07)
Basophils Absolute: 0.1 K/uL (ref 0.0–0.1)
Basophils Relative: 1 %
Eosinophils Absolute: 0.2 K/uL (ref 0.0–0.5)
Eosinophils Relative: 3 %
HCT: 39 % (ref 36.0–46.0)
Hemoglobin: 12.1 g/dL (ref 12.0–15.0)
Immature Granulocytes: 0 %
Lymphocytes Relative: 50 %
Lymphs Abs: 3.3 K/uL (ref 0.7–4.0)
MCH: 21.8 pg — ABNORMAL LOW (ref 26.0–34.0)
MCHC: 31 g/dL (ref 30.0–36.0)
MCV: 70.4 fL — ABNORMAL LOW (ref 80.0–100.0)
Monocytes Absolute: 0.6 K/uL (ref 0.1–1.0)
Monocytes Relative: 9 %
Neutro Abs: 2.4 K/uL (ref 1.7–7.7)
Neutrophils Relative %: 37 %
Platelet Count: 312 K/uL (ref 150–400)
RBC: 5.54 MIL/uL — ABNORMAL HIGH (ref 3.87–5.11)
RDW: 19.1 % — ABNORMAL HIGH (ref 11.5–15.5)
WBC Count: 6.5 K/uL (ref 4.0–10.5)
nRBC: 0 % (ref 0.0–0.2)

## 2023-11-20 LAB — CMP (CANCER CENTER ONLY)
ALT: 13 U/L (ref 0–44)
AST: 17 U/L (ref 15–41)
Albumin: 4.5 g/dL (ref 3.5–5.0)
Alkaline Phosphatase: 50 U/L (ref 38–126)
Anion gap: 7 (ref 5–15)
BUN: 19 mg/dL (ref 6–20)
CO2: 28 mmol/L (ref 22–32)
Calcium: 9.5 mg/dL (ref 8.9–10.3)
Chloride: 104 mmol/L (ref 98–111)
Creatinine: 0.9 mg/dL (ref 0.44–1.00)
GFR, Estimated: >60 mL/min (ref >60)
Glucose, Bld: 85 mg/dL (ref 70–99)
Potassium: 3.7 mmol/L (ref 3.5–5.1)
Sodium: 139 mmol/L (ref 135–145)
Total Bilirubin: 0.6 mg/dL (ref 0.0–1.2)
Total Protein: 7.6 g/dL (ref 6.5–8.1)

## 2023-11-20 LAB — LACTATE DEHYDROGENASE: LDH: 187 U/L (ref 98–192)

## 2023-11-20 NOTE — Progress Notes (Signed)
 [facility]  HEMATOLOGY CLINIC PROGRESS NOTE  PATIENT NAME: Kaitlyn Solomon   MR#: [id number] DOB: Jul 16, 1965  Patient Care Team: Wannetta Dickey Idell DEVONNA as PCP - General (Physician Assistant)  Date of visit: 11/20/2023   ASSESSMENT & PLAN:   Kaitlyn Solomon is a 58 y.o. lady with a past medical history of hypertension, hypothyroidism, GERD, bipolar disorder, history of subarachnoid hemorrhage in 2019, was referred to our service in April 2025 for evaluation of thrombocytosis and polycythemia.  Workup consistent with myeloproliferative neoplasm with JAK2 V617F mutation positivity. R-IPSET score of 2, intermediate risk for thrombosis   Myeloproliferative neoplasm (HCC) Thrombocytosis with a platelet count of 500,000, exceeding the normal range of 150,000 to 450,000. Differential diagnosis includes iron deficiency, inflammation, or a bone marrow mutation causing overproduction of platelets. Occasional gum bleeding noted. No splenectomy history.   On her initial consultation with us  on 06/12/2023, labs showed platelet count of 488,000, remains elevated.  White count 8400 with normal differential.  Hemoglobin normal at 13.4, MCV decreased at 66.8.  Iron studies showed no evidence of iron deficiency.  Sed rate normal at 2 mm/h.  CRP normal at 0.5 mg/dL.  LDH was increased at 242.  JAK2 V617F mutation was positive.   On 07/26/2023, she underwent bone marrow biopsy which showed hypercellular bone marrow (70%) involved by JAK2 positive myeloproliferative neoplasm.  Megakaryocytic hyperplasia and atypia and mild expansion of the erythroid lineage was noted.  Overall findings were consistent with primary myeloproliferative neoplasm (ET/PCV).  No increase in blasts or fibrosis.  Conventional cytogenetics showed normal female karyotype, 63 XX.  Previously I discussed results of the bone marrow biopsy in detail with the patient.  Explained that we are dealing with primary  myeloproliferative neoplasm.   Microcytosis with small red blood cell size and slightly elevated total red blood cell count, but normal hemoglobin levels.  Since iron studies showed no evidence of iron deficiency, we proceeded with hemoglobin electrophoresis and alpha thalassemia testing.  Hemoglobin electrophoresis showed no evidence of hemoglobinopathy.  However alpha thalassemia testing did show evidence of homozygosity for -alpha 3.7 deletion, indicating alpha thalassemia trait.  Alpha thalassemia trait could be masking her polycythemia vera. Though RBC count is increased, hemoglobin is within normal limits.   R-IPSET score of 2, intermediate risk for thrombosis.  Goal is to maintain platelet count below 400,000 to prevent thrombosis related complications.    Started her on hydroxyurea  500 mg daily from 08/07/2023.  Discussed potential side effects including leg ulcers, fatigue, and nausea, though uncommon at the starting dose. Aspirin  81 mg daily is recommended to manage vasomotor symptoms and reduce clotting risk. The condition is not hereditary but can progress to leukemia if untreated. Treatment aims to prevent leukemic transformation by controlling abnormal marrow cell production.  Since she was complaining of increased fatigue, night sweats, insomnia and leg cramps, we dose reduced hydroxyurea  to 500 mg every other day starting from August 2025.  This has not really made a huge difference in her symptoms.  The possibility of night shift work contributing to symptoms was discussed. She plans to consider moving to a day shift after the first of the year, which may help alleviate symptoms.  Labs today showed continued improvement in platelet count at 312,000.  White count and hemoglobin normal.  CMP unremarkable.  She was advised to continue hydroxyurea  500 mg every other day.  - Continue baby aspirin  once daily.  - Follow up in 2 months to reassess  symptoms and blood counts.  Alpha  thalassaemia minor Microcytosis with small red blood cell size and slightly elevated total red blood cell count, but normal hemoglobin levels.  Since iron studies showed no evidence of iron deficiency, we proceeded with hemoglobin electrophoresis and alpha thalassemia testing.  Hemoglobin electrophoresis showed no evidence of hemoglobinopathy.  However alpha thalassemia testing did show evidence of homozygosity for -alpha 3.7 deletion, indicating alpha thalassemia trait.  Alpha thalassemia trait could be masking her polycythemia vera.  Though RBC count is increased, hemoglobin is within normal limits.   I spent a total of 25 minutes during this encounter with the patient including review of chart and various tests results, discussions about plan of care and coordination of care plan.  I reviewed lab results and outside records for this visit and discussed relevant results with the patient. Diagnosis, plan of care and treatment options were also discussed in detail with the patient. Opportunity provided to ask questions and answers provided to her apparent satisfaction. Provided instructions to call our clinic with any problems, questions or concerns prior to return visit. I recommended to continue follow-up with PCP and sub-specialists. She verbalized understanding and agreed with the plan. No barriers to learning was detected.  Chinita Patten, MD  11/20/2023 3:49 PM  [facility] - A DEPT [facility] [address] [place] [address] Dept: [phone] Dept Fax: [phone]   CHIEF COMPLAINT/ REASON FOR VISIT:  Myeloproliferative neoplasm (essential thrombocythemia/polycythemia vera), diagnosed in June 2025, JAK2 V617F mutation positive.  INTERVAL HISTORY:  Discussed the use of AI scribe software for clinical note transcription with the patient, who gave verbal consent to proceed.  History of Present Illness  Kaitlyn K  Solomon is a 58 year old female with myeloproliferative disorder who presents for follow-up regarding her symptoms and medication management.  She experiences persistent fatigue and sleepiness despite adjustments in her Hydrea  dosage. The fatigue and sleepiness are constant, and she describes feeling tired 'all the time for life.'  She has been experiencing night sweats, although they have improved somewhat. No recent fevers or chills are noted.  Leg cramps have decreased in frequency and severity since the dosage adjustment of Hydrea .  Her current medication regimen includes Hydrea , which she takes every other day.   SUMMARY OF HEMATOLOGIC HISTORY:  On 02/01/2023, labs at her PCPs office showed hemoglobin of 13.3, hematocrit 44.9, MCV 68.800.  Platelet count was 500,000.  White count 7700 with normal differential.  Red blood cell count was elevated at 6.5 million.  Iron studies were grossly within normal limits.  Thyroid  function tests were normal as well.   On 05/06/2023, repeat labs showed platelet count of 06/21/1998.  Hemoglobin 13.6, hematocrit 46.2, MCV 68.2.  White count 9800 with normal differential.  She was referred to us  for further evaluation of thrombocytosis, microcytosis.   She has a history of heavy menstrual cycles, which led to a partial hysterectomy. She no longer experiences menstrual cycles. She reports occasional gum bleeding but denies any other bleeding issues such as blood in stools, black stools, or epistaxis.   There is no known family history of sickle cell disease, and she has not had any surgeries on her spleen. She does not smoke and consumes alcohol occasionally. No unintentional weight loss. She is due for a colonoscopy, as it has been more than five years since her last screening. She is up to date with her mammogram  screenings.   Patient has never been diagnosed with thrombotic events.   Differential diagnosis includes iron deficiency, inflammation, or a bone  marrow mutation causing overproduction of platelets. Occasional gum bleeding noted.    On her initial consultation with us  on 06/12/2023, labs showed platelet count of 488,000, remains elevated.  White count 8400 with normal differential.  Hemoglobin normal at 13.4, MCV decreased at 66.8.  Iron studies showed no evidence of iron deficiency.  Sed rate normal at 2 mm/h.  CRP normal at 0.5 mg/dL.  LDH was increased at 242.  JAK2 V617F mutation was positive.    On 07/26/2023, she underwent bone marrow biopsy which showed hypercellular bone marrow (70%) involved by JAK2 positive myeloproliferative neoplasm.  Megakaryocytic hyperplasia and atypia and mild expansion of the erythroid lineage was noted.  Overall findings were consistent with primary myeloproliferative neoplasm (ET/PCV).  No increase in blasts or fibrosis.  Conventional cytogenetics showed normal female karyotype, 31 XX.    Microcytosis with small red blood cell size and slightly elevated total red blood cell count, but normal hemoglobin levels.  Since iron studies showed no evidence of iron deficiency, we proceeded with hemoglobin electrophoresis and alpha thalassemia testing.  Hemoglobin electrophoresis showed no evidence of hemoglobinopathy.  However alpha thalassemia testing did show evidence of homozygosity for -alpha 3.7 deletion, indicating alpha thalassemia trait.  Alpha thalassemia trait could be masking her polycythemia vera. Though RBC count is increased, hemoglobin is within normal limits.   R-IPSET score of 2, intermediate risk for thrombosis.  Goal is to maintain platelet count below 400,000 to prevent thrombosis related complications.    Started her on hydroxyurea  500 mg daily from 08/07/2023.  Because of side effects of leg cramps, night sweats, decreased appetite, dose reduced Hydrea  to 500 mg every other day starting from 10/09/2023.  I have reviewed the past medical history, past surgical history, social history and family  history with the patient and they are unchanged from previous note.  ALLERGIES: She is allergic to lamotrigine and codeine.  MEDICATIONS:  Current Outpatient Medications  Medication Sig Dispense Refill   amLODipine  (NORVASC ) 10 MG tablet TAKE 1 TABLET(10 MG) BY MOUTH DAILY 90 tablet 0   aspirin  EC 81 MG tablet Take 1 tablet (81 mg total) by mouth daily. Swallow whole.     cholecalciferol (VITAMIN D ) 1000 UNITS tablet Take 1,000 Units by mouth daily.     hydroxyurea  (HYDREA ) 500 MG capsule Take 1 capsule (500 mg total) by mouth daily. May take with food to minimize GI side effects. 30 capsule 3   levothyroxine  (SYNTHROID ) 88 MCG tablet Take 88 mcg by mouth daily.     Multiple Vitamin (MULTIVITAMIN) tablet Take 1 tablet by mouth daily.     pantoprazole (PROTONIX) 20 MG tablet Take 20 mg by mouth daily as needed for heartburn or indigestion.     FLUoxetine (PROZAC) 40 MG capsule Take 40 mg by mouth daily.     No current facility-administered medications for this visit.     REVIEW OF SYSTEMS:    Review of Systems - Oncology  All other pertinent systems were reviewed with the patient and are negative.  PHYSICAL EXAMINATION:    Onc Performance Status - 11/20/23 1501       ECOG Perf Status   ECOG Perf Status Restricted in physically strenuous activity but ambulatory and able to carry out work of a light or sedentary nature, e.g., light house work, office work      KPS SCALE  KPS % SCORE Normal activity with effort, some s/s of disease            Vitals:   11/20/23 1448  BP: 126/83  Pulse: 71  Resp: 16  Temp: (!) 97.2 F (36.2 C)  SpO2: 99%    Filed Weights   11/20/23 1448  Weight: 149 lb (67.6 kg)     Physical Exam Constitutional:      General: She is not in acute distress.    Appearance: Normal appearance.  HENT:     Head: Normocephalic and atraumatic.  Cardiovascular:     Rate and Rhythm: Normal rate and regular rhythm.     Heart sounds: Normal heart  sounds.  Pulmonary:     Effort: Pulmonary effort is normal. No respiratory distress.     Breath sounds: Normal breath sounds.  Abdominal:     General: There is no distension.     Palpations: There is no mass.  Neurological:     General: No focal deficit present.     Mental Status: She is alert and oriented to person, place, and time.  Psychiatric:        Mood and Affect: Mood normal.        Behavior: Behavior normal.     LABORATORY DATA:   I have reviewed the data as listed.  Results for orders placed or performed in visit on 11/20/23  Lactate dehydrogenase  Result Value Ref Range   LDH 187 98 - 192 U/L  CMP (Cancer Center only)  Result Value Ref Range   Sodium 139 135 - 145 mmol/L   Potassium 3.7 3.5 - 5.1 mmol/L   Chloride 104 98 - 111 mmol/L   CO2 28 22 - 32 mmol/L   Glucose, Bld 85 70 - 99 mg/dL   BUN 19 6 - 20 mg/dL   Creatinine 9.09 9.55 - 1.00 mg/dL   Calcium 9.5 8.9 - 89.6 mg/dL   Total Protein 7.6 6.5 - 8.1 g/dL   Albumin 4.5 3.5 - 5.0 g/dL   AST 17 15 - 41 U/L   ALT 13 0 - 44 U/L   Alkaline Phosphatase 50 38 - 126 U/L   Total Bilirubin 0.6 0.0 - 1.2 mg/dL   GFR, Estimated >39 >39 mL/min   Anion gap 7 5 - 15  CBC with Differential (Cancer Center Only)  Result Value Ref Range   WBC Count 6.5 4.0 - 10.5 K/uL   RBC 5.54 (H) 3.87 - 5.11 MIL/uL   Hemoglobin 12.1 12.0 - 15.0 g/dL   HCT 60.9 63.9 - 53.9 %   MCV 70.4 (L) 80.0 - 100.0 fL   MCH 21.8 (L) 26.0 - 34.0 pg   MCHC 31.0 30.0 - 36.0 g/dL   RDW 80.8 (H) 88.4 - 84.4 %   Platelet Count 312 150 - 400 K/uL   nRBC 0.0 0.0 - 0.2 %   Neutrophils Relative % 37 %   Neutro Abs 2.4 1.7 - 7.7 K/uL   Lymphocytes Relative 50 %   Lymphs Abs 3.3 0.7 - 4.0 K/uL   Monocytes Relative 9 %   Monocytes Absolute 0.6 0.1 - 1.0 K/uL   Eosinophils Relative 3 %   Eosinophils Absolute 0.2 0.0 - 0.5 K/uL   Basophils Relative 1 %   Basophils Absolute 0.1 0.0 - 0.1 K/uL   Immature Granulocytes 0 %   Abs Immature Granulocytes  0.02 0.00 - 0.07 K/uL      RADIOGRAPHIC STUDIES:  No recent pertinent imaging  available to review.   Orders Placed This Encounter  Procedures   CBC with Differential (Cancer Center Only)    Standing Status:   Future    Expected Date:   01/20/2024    Expiration Date:   04/19/2024   Lactate dehydrogenase    Standing Status:   Future    Expected Date:   01/20/2024    Expiration Date:   04/19/2024      This document was completed utilizing speech recognition software. Grammatical errors, random word insertions, pronoun errors, and incomplete sentences are an occasional consequence of this system due to software limitations, ambient noise, and hardware issues. Any formal questions or concerns about the content, text or information contained within the body of this dictation should be directly addressed to the provider for clarification.

## 2023-11-20 NOTE — Assessment & Plan Note (Addendum)
 Thrombocytosis with a platelet count of 500,000, exceeding the normal range of 150,000 to 450,000. Differential diagnosis includes iron deficiency, inflammation, or a bone marrow mutation causing overproduction of platelets. Occasional gum bleeding noted. No splenectomy history.   On her initial consultation with us  on 06/12/2023, labs showed platelet count of 488,000, remains elevated.  White count 8400 with normal differential.  Hemoglobin normal at 13.4, MCV decreased at 66.8.  Iron studies showed no evidence of iron deficiency.  Sed rate normal at 2 mm/h.  CRP normal at 0.5 mg/dL.  LDH was increased at 242.  JAK2 V617F mutation was positive.   On 07/26/2023, she underwent bone marrow biopsy which showed hypercellular bone marrow (70%) involved by JAK2 positive myeloproliferative neoplasm.  Megakaryocytic hyperplasia and atypia and mild expansion of the erythroid lineage was noted.  Overall findings were consistent with primary myeloproliferative neoplasm (ET/PCV).  No increase in blasts or fibrosis.  Conventional cytogenetics showed normal female karyotype, 73 XX.  Previously I discussed results of the bone marrow biopsy in detail with the patient.  Explained that we are dealing with primary myeloproliferative neoplasm.   Microcytosis with small red blood cell size and slightly elevated total red blood cell count, but normal hemoglobin levels.  Since iron studies showed no evidence of iron deficiency, we proceeded with hemoglobin electrophoresis and alpha thalassemia testing.  Hemoglobin electrophoresis showed no evidence of hemoglobinopathy.  However alpha thalassemia testing did show evidence of homozygosity for -alpha 3.7 deletion, indicating alpha thalassemia trait.  Alpha thalassemia trait could be masking her polycythemia vera. Though RBC count is increased, hemoglobin is within normal limits.   R-IPSET score of 2, intermediate risk for thrombosis.  Goal is to maintain platelet count below  400,000 to prevent thrombosis related complications.    Started her on hydroxyurea  500 mg daily from 08/07/2023.  Discussed potential side effects including leg ulcers, fatigue, and nausea, though uncommon at the starting dose. Aspirin  81 mg daily is recommended to manage vasomotor symptoms and reduce clotting risk. The condition is not hereditary but can progress to leukemia if untreated. Treatment aims to prevent leukemic transformation by controlling abnormal marrow cell production.  Since she was complaining of increased fatigue, night sweats, insomnia and leg cramps, we dose reduced hydroxyurea  to 500 mg every other day starting from August 2025.  This has not really made a huge difference in her symptoms.  The possibility of night shift work contributing to symptoms was discussed. She plans to consider moving to a day shift after the first of the year, which may help alleviate symptoms.  Labs today showed continued improvement in platelet count at 312,000.  White count and hemoglobin normal.  CMP unremarkable.  She was advised to continue hydroxyurea  500 mg every other day.  - Continue baby aspirin  once daily.  - Follow up in 2 months to reassess symptoms and blood counts.

## 2023-11-20 NOTE — Assessment & Plan Note (Signed)
 Microcytosis with small red blood cell size and slightly elevated total red blood cell count, but normal hemoglobin levels.  Since iron studies showed no evidence of iron deficiency, we proceeded with hemoglobin electrophoresis and alpha thalassemia testing.  Hemoglobin electrophoresis showed no evidence of hemoglobinopathy.  However alpha thalassemia testing did show evidence of homozygosity for -alpha 3.7 deletion, indicating alpha thalassemia trait.  Alpha thalassemia trait could be masking her polycythemia vera.  Though RBC count is increased, hemoglobin is within normal limits.

## 2024-01-22 ENCOUNTER — Inpatient Hospital Stay: Admitting: Oncology

## 2024-01-22 ENCOUNTER — Inpatient Hospital Stay: Attending: Oncology

## 2024-01-22 VITALS — BP 122/83 | HR 65 | Temp 97.6°F | Resp 16 | Ht 62.0 in | Wt 154.0 lb

## 2024-01-22 DIAGNOSIS — E039 Hypothyroidism, unspecified: Secondary | ICD-10-CM | POA: Diagnosis not present

## 2024-01-22 DIAGNOSIS — D471 Chronic myeloproliferative disease: Secondary | ICD-10-CM

## 2024-01-22 DIAGNOSIS — D563 Thalassemia minor: Secondary | ICD-10-CM

## 2024-01-22 DIAGNOSIS — Z79899 Other long term (current) drug therapy: Secondary | ICD-10-CM | POA: Insufficient documentation

## 2024-01-22 DIAGNOSIS — Z7982 Long term (current) use of aspirin: Secondary | ICD-10-CM | POA: Insufficient documentation

## 2024-01-22 DIAGNOSIS — Z7964 Long term (current) use of myelosuppressive agent: Secondary | ICD-10-CM | POA: Insufficient documentation

## 2024-01-22 LAB — CMP (CANCER CENTER ONLY)
ALT: 16 U/L (ref 0–44)
AST: 24 U/L (ref 15–41)
Albumin: 4.4 g/dL (ref 3.5–5.0)
Alkaline Phosphatase: 57 U/L (ref 38–126)
Anion gap: 10 (ref 5–15)
BUN: 17 mg/dL (ref 6–20)
CO2: 26 mmol/L (ref 22–32)
Calcium: 9.3 mg/dL (ref 8.9–10.3)
Chloride: 102 mmol/L (ref 98–111)
Creatinine: 0.8 mg/dL (ref 0.44–1.00)
GFR, Estimated: >60 mL/min (ref >60)
Glucose, Bld: 91 mg/dL (ref 70–99)
Potassium: 3.7 mmol/L (ref 3.5–5.1)
Sodium: 138 mmol/L (ref 135–145)
Total Bilirubin: 0.4 mg/dL (ref 0.0–1.2)
Total Protein: 7.3 g/dL (ref 6.5–8.1)

## 2024-01-22 LAB — CBC WITH DIFFERENTIAL (CANCER CENTER ONLY)
Abs Immature Granulocytes: 0.01 K/uL (ref 0.00–0.07)
Basophils Absolute: 0.1 K/uL (ref 0.0–0.1)
Basophils Relative: 1 %
Eosinophils Absolute: 0.2 K/uL (ref 0.0–0.5)
Eosinophils Relative: 3 %
HCT: 39.8 % (ref 36.0–46.0)
Hemoglobin: 12.2 g/dL (ref 12.0–15.0)
Immature Granulocytes: 0 %
Lymphocytes Relative: 51 %
Lymphs Abs: 3.5 K/uL (ref 0.7–4.0)
MCH: 21.9 pg — ABNORMAL LOW (ref 26.0–34.0)
MCHC: 30.7 g/dL (ref 30.0–36.0)
MCV: 71.5 fL — ABNORMAL LOW (ref 80.0–100.0)
Monocytes Absolute: 0.6 K/uL (ref 0.1–1.0)
Monocytes Relative: 9 %
Neutro Abs: 2.5 K/uL (ref 1.7–7.7)
Neutrophils Relative %: 36 %
Platelet Count: 292 K/uL (ref 150–400)
RBC: 5.57 MIL/uL — ABNORMAL HIGH (ref 3.87–5.11)
RDW: 17.1 % — ABNORMAL HIGH (ref 11.5–15.5)
WBC Count: 6.8 K/uL (ref 4.0–10.5)
nRBC: 0 % (ref 0.0–0.2)

## 2024-01-22 LAB — LACTATE DEHYDROGENASE: LDH: 186 U/L (ref 105–235)

## 2024-01-22 NOTE — Progress Notes (Unsigned)
 [facility]  HEMATOLOGY CLINIC PROGRESS NOTE  PATIENT NAME: Kaitlyn Solomon   MR#: [id number] DOB: [birth year]  Patient Care Team: Wannetta Dickey Idell DEVONNA as PCP - General (Physician Assistant)  Date of visit: 01/22/2024   ASSESSMENT & PLAN:   Kaitlyn Solomon is a 58 y.o. lady with a past medical history of hypertension, hypothyroidism, GERD, bipolar disorder, history of subarachnoid hemorrhage in 2019, was referred to our service in April 2025 for evaluation of thrombocytosis and polycythemia.  Workup consistent with myeloproliferative neoplasm with JAK2 V617F mutation positivity. R-IPSET score of 2, intermediate risk for thrombosis   Myeloproliferative neoplasm (HCC) Thrombocytosis with a platelet count of 500,000, exceeding the normal range of 150,000 to 450,000. Differential diagnosis includes iron deficiency, inflammation, or a bone marrow mutation causing overproduction of platelets. Occasional gum bleeding noted. No splenectomy history.   On her initial consultation with us  on 06/12/2023, labs showed platelet count of 488,000, remains elevated.  White count 8400 with normal differential.  Hemoglobin normal at 13.4, MCV decreased at 66.8.  Iron studies showed no evidence of iron deficiency.  Sed rate normal at 2 mm/h.  CRP normal at 0.5 mg/dL.  LDH was increased at 242.  JAK2 V617F mutation was positive.   On 07/26/2023, she underwent bone marrow biopsy which showed hypercellular bone marrow (70%) involved by JAK2 positive myeloproliferative neoplasm.  Megakaryocytic hyperplasia and atypia and mild expansion of the erythroid lineage was noted.  Overall findings were consistent with primary myeloproliferative neoplasm (ET/PCV).  No increase in blasts or fibrosis.  Conventional cytogenetics showed normal female karyotype, 83 XX.  Previously I discussed results of the bone marrow biopsy in detail with the patient.  Explained that we are dealing with primary  myeloproliferative neoplasm.   Microcytosis with small red blood cell size and slightly elevated total red blood cell count, but normal hemoglobin levels.  Since iron studies showed no evidence of iron deficiency, we proceeded with hemoglobin electrophoresis and alpha thalassemia testing.  Hemoglobin electrophoresis showed no evidence of hemoglobinopathy.  However alpha thalassemia testing did show evidence of homozygosity for -alpha 3.7 deletion, indicating alpha thalassemia trait.  Alpha thalassemia trait could be masking her polycythemia vera. Though RBC count is increased, hemoglobin is within normal limits.   R-IPSET score of 2, intermediate risk for thrombosis.  Goal is to maintain platelet count below 400,000 to prevent thrombosis related complications.    Started her on hydroxyurea  500 mg daily from 08/07/2023.  Discussed potential side effects including leg ulcers, fatigue, and nausea, though uncommon at the starting dose. Aspirin  81 mg daily is recommended to manage vasomotor symptoms and reduce clotting risk. The condition is not hereditary but can progress to leukemia if untreated. Treatment aims to prevent leukemic transformation by controlling abnormal marrow cell production.  Since she was complaining of increased fatigue, night sweats, insomnia and leg cramps, we dose reduced hydroxyurea  to 500 mg every other day starting from August 2025.  This has not really made a huge difference in her symptoms.  The possibility of night shift work contributing to symptoms was discussed. She plans to consider moving to a day shift after the first of the year, which may help alleviate symptoms.  Labs today showed continued improvement in platelet count at 312,000.  White count and hemoglobin normal.  CMP unremarkable.  She was advised to continue hydroxyurea  500 mg every other day.  - Continue baby aspirin  once daily.  - Follow up in 2 months to reassess  symptoms and blood counts.  Alpha  thalassaemia minor Microcytosis with small red blood cell size and slightly elevated total red blood cell count, but normal hemoglobin levels.  Since iron studies showed no evidence of iron deficiency, we proceeded with hemoglobin electrophoresis and alpha thalassemia testing.  Hemoglobin electrophoresis showed no evidence of hemoglobinopathy.  However alpha thalassemia testing did show evidence of homozygosity for -alpha 3.7 deletion, indicating alpha thalassemia trait.  Alpha thalassemia trait could be masking her polycythemia vera.  Though RBC count is increased, hemoglobin is within normal limits.  Assessment and Plan Assessment & Plan     I spent a total of *** minutes during this encounter with the patient including review of chart and various tests results, discussions about plan of care and coordination of care plan.  I reviewed lab results and outside records for this visit and discussed relevant results with the patient. Diagnosis, plan of care and treatment options were also discussed in detail with the patient. Opportunity provided to ask questions and answers provided to her apparent satisfaction. Provided instructions to call our clinic with any problems, questions or concerns prior to return visit. I recommended to continue follow-up with PCP and sub-specialists. She verbalized understanding and agreed with the plan. No barriers to learning was detected.  Kaitlyn Patten, MD  01/22/2024 3:45 PM  [facility] - A DEPT OF [facility] [address] [place] [address] Dept: [phone] Dept Fax: [phone]   CHIEF COMPLAINT/ REASON FOR VISIT:  Myeloproliferative neoplasm (essential thrombocythemia/polycythemia vera), diagnosed in June 2025, JAK2 V617F mutation positive.  INTERVAL HISTORY:  Discussed the use of AI scribe software for clinical note transcription with the patient, who gave verbal consent to  proceed.  History of Present Illness  Kaitlyn Solomon is a 58 year old female with myeloproliferative disorder who presents for follow-up regarding her symptoms and medication management.  She experiences persistent fatigue and sleepiness despite adjustments in her Hydrea  dosage. The fatigue and sleepiness are constant, and she describes feeling tired 'all the time for life.'  She has been experiencing night sweats, although they have improved somewhat. No recent fevers or chills are noted.  Leg cramps have decreased in frequency and severity since the dosage adjustment of Hydrea .  Her current medication regimen includes Hydrea , which she takes every other day.   SUMMARY OF HEMATOLOGIC HISTORY:  On 02/01/2023, labs at her PCPs office showed hemoglobin of 13.3, hematocrit 44.9, MCV 68.800.  Platelet count was 500,000.  White count 7700 with normal differential.  Red blood cell count was elevated at 6.5 million.  Iron studies were grossly within normal limits.  Thyroid  function tests were normal as well.   On 05/06/2023, repeat labs showed platelet count of 06/21/1998.  Hemoglobin 13.6, hematocrit 46.2, MCV 68.2.  White count 9800 with normal differential.  She was referred to us  for further evaluation of thrombocytosis, microcytosis.   She has a history of heavy menstrual cycles, which led to a partial hysterectomy. She no longer experiences menstrual cycles. She reports occasional gum bleeding but denies any other bleeding issues such as blood in stools, black stools, or epistaxis.   There is no known family history of sickle cell disease, and she has not had any surgeries on her spleen. She does not smoke and consumes alcohol occasionally. No unintentional weight loss. She is due for a colonoscopy, as it has been more than five years since her last  screening. She is up to date with her mammogram screenings.   Patient has never been diagnosed with thrombotic events.   Differential diagnosis  includes iron deficiency, inflammation, or a bone marrow mutation causing overproduction of platelets. Occasional gum bleeding noted.    On her initial consultation with us  on 06/12/2023, labs showed platelet count of 488,000, remains elevated.  White count 8400 with normal differential.  Hemoglobin normal at 13.4, MCV decreased at 66.8.  Iron studies showed no evidence of iron deficiency.  Sed rate normal at 2 mm/h.  CRP normal at 0.5 mg/dL.  LDH was increased at 242.  JAK2 V617F mutation was positive.    On 07/26/2023, she underwent bone marrow biopsy which showed hypercellular bone marrow (70%) involved by JAK2 positive myeloproliferative neoplasm.  Megakaryocytic hyperplasia and atypia and mild expansion of the erythroid lineage was noted.  Overall findings were consistent with primary myeloproliferative neoplasm (ET/PCV).  No increase in blasts or fibrosis.  Conventional cytogenetics showed normal female karyotype, 23 XX.    Microcytosis with small red blood cell size and slightly elevated total red blood cell count, but normal hemoglobin levels.  Since iron studies showed no evidence of iron deficiency, we proceeded with hemoglobin electrophoresis and alpha thalassemia testing.  Hemoglobin electrophoresis showed no evidence of hemoglobinopathy.  However alpha thalassemia testing did show evidence of homozygosity for -alpha 3.7 deletion, indicating alpha thalassemia trait.  Alpha thalassemia trait could be masking her polycythemia vera. Though RBC count is increased, hemoglobin is within normal limits.   R-IPSET score of 2, intermediate risk for thrombosis.  Goal is to maintain platelet count below 400,000 to prevent thrombosis related complications.    Started her on hydroxyurea  500 mg daily from 08/07/2023.  Because of side effects of leg cramps, night sweats, decreased appetite, dose reduced Hydrea  to 500 mg every other day starting from 10/09/2023.  I have reviewed the past medical history, past  surgical history, social history and family history with the patient and they are unchanged from previous note.  ALLERGIES: She is allergic to lamotrigine and codeine.  MEDICATIONS:  Current Outpatient Medications  Medication Sig Dispense Refill   amLODipine  (NORVASC ) 10 MG tablet TAKE 1 TABLET(10 MG) BY MOUTH DAILY 90 tablet 0   aspirin  EC 81 MG tablet Take 1 tablet (81 mg total) by mouth daily. Swallow whole.     cholecalciferol (VITAMIN D ) 1000 UNITS tablet Take 1,000 Units by mouth daily.     FLUoxetine (PROZAC) 40 MG capsule Take 40 mg by mouth daily.     hydroxyurea  (HYDREA ) 500 MG capsule Take 1 capsule (500 mg total) by mouth daily. May take with food to minimize GI side effects. 30 capsule 3   levothyroxine  (SYNTHROID ) 88 MCG tablet Take 88 mcg by mouth daily.     Multiple Vitamin (MULTIVITAMIN) tablet Take 1 tablet by mouth daily.     pantoprazole (PROTONIX) 20 MG tablet Take 20 mg by mouth daily as needed for heartburn or indigestion.     traZODone  (DESYREL ) 50 MG tablet Take 25 mg by mouth at bedtime. (Patient taking differently: Take 25 mg by mouth at bedtime. Takes prn)     No current facility-administered medications for this visit.     REVIEW OF SYSTEMS:    Review of Systems - Oncology  All other pertinent systems were reviewed with the patient and are negative.  PHYSICAL EXAMINATION:    Onc Performance Status - 01/22/24 1538       ECOG Perf Status  ECOG Perf Status Restricted in physically strenuous activity but ambulatory and able to carry out work of a light or sedentary nature, e.g., light house work, office work      KPS SCALE   KPS % SCORE Normal activity with effort, some s/s of disease           Vitals:   01/22/24 1515 01/22/24 1518  BP: (!) 138/92 122/83  Pulse: 65   Resp: 16   Temp: 97.6 F (36.4 C)   SpO2: 100%     Filed Weights   01/22/24 1515  Weight: 154 lb (69.9 kg)     Physical Exam Constitutional:      General: She is not  in acute distress.    Appearance: Normal appearance.  HENT:     Head: Normocephalic and atraumatic.  Cardiovascular:     Rate and Rhythm: Normal rate and regular rhythm.     Heart sounds: Normal heart sounds.  Pulmonary:     Effort: Pulmonary effort is normal. No respiratory distress.     Breath sounds: Normal breath sounds.  Abdominal:     General: There is no distension.     Palpations: There is no mass.  Neurological:     General: No focal deficit present.     Mental Status: She is alert and oriented to person, place, and time.  Psychiatric:        Mood and Affect: Mood normal.        Behavior: Behavior normal.     LABORATORY DATA:   I have reviewed the data as listed.  Results for orders placed or performed in visit on 01/22/24  Lactate dehydrogenase  Result Value Ref Range   LDH 186 105 - 235 U/L  CMP (Cancer Center only)  Result Value Ref Range   Sodium 138 135 - 145 mmol/L   Potassium 3.7 3.5 - 5.1 mmol/L   Chloride 102 98 - 111 mmol/L   CO2 26 22 - 32 mmol/L   Glucose, Bld 91 70 - 99 mg/dL   BUN 17 6 - 20 mg/dL   Creatinine 9.19 9.55 - 1.00 mg/dL   Calcium 9.3 8.9 - 89.6 mg/dL   Total Protein 7.3 6.5 - 8.1 g/dL   Albumin 4.4 3.5 - 5.0 g/dL   AST 24 15 - 41 U/L   ALT 16 0 - 44 U/L   Alkaline Phosphatase 57 38 - 126 U/L   Total Bilirubin 0.4 0.0 - 1.2 mg/dL   GFR, Estimated >39 >39 mL/min   Anion gap 10 5 - 15  CBC with Differential (Cancer Center Only)  Result Value Ref Range   WBC Count 6.8 4.0 - 10.5 K/uL   RBC 5.57 (H) 3.87 - 5.11 MIL/uL   Hemoglobin 12.2 12.0 - 15.0 g/dL   HCT 60.1 63.9 - 53.9 %   MCV 71.5 (L) 80.0 - 100.0 fL   MCH 21.9 (L) 26.0 - 34.0 pg   MCHC 30.7 30.0 - 36.0 g/dL   RDW 82.8 (H) 88.4 - 84.4 %   Platelet Count 292 150 - 400 K/uL   nRBC 0.0 0.0 - 0.2 %   Neutrophils Relative % 36 %   Neutro Abs 2.5 1.7 - 7.7 K/uL   Lymphocytes Relative 51 %   Lymphs Abs 3.5 0.7 - 4.0 K/uL   Monocytes Relative 9 %   Monocytes Absolute 0.6  0.1 - 1.0 K/uL   Eosinophils Relative 3 %   Eosinophils Absolute 0.2 0.0 - 0.5 K/uL  Basophils Relative 1 %   Basophils Absolute 0.1 0.0 - 0.1 K/uL   Immature Granulocytes 0 %   Abs Immature Granulocytes 0.01 0.00 - 0.07 K/uL      RADIOGRAPHIC STUDIES:  No recent pertinent imaging available to review.   Orders Placed This Encounter  Procedures   CBC with Differential (Cancer Center Only)    Standing Status:   Future    Expiration Date:   01/21/2025   CMP (Cancer Center only)    Standing Status:   Future    Expiration Date:   01/21/2025   Lactate dehydrogenase    Standing Status:   Future    Expiration Date:   01/21/2025      This document was completed utilizing speech recognition software. Grammatical errors, random word insertions, pronoun errors, and incomplete sentences are an occasional consequence of this system due to software limitations, ambient noise, and hardware issues. Any formal questions or concerns about the content, text or information contained within the body of this dictation should be directly addressed to the provider for clarification.

## 2024-01-22 NOTE — Assessment & Plan Note (Signed)
 Thrombocytosis with a platelet count of 500,000, exceeding the normal range of 150,000 to 450,000. Differential diagnosis includes iron deficiency, inflammation, or a bone marrow mutation causing overproduction of platelets. Occasional gum bleeding noted. No splenectomy history.   On her initial consultation with us  on 06/12/2023, labs showed platelet count of 488,000, remains elevated.  White count 8400 with normal differential.  Hemoglobin normal at 13.4, MCV decreased at 66.8.  Iron studies showed no evidence of iron deficiency.  Sed rate normal at 2 mm/h.  CRP normal at 0.5 mg/dL.  LDH was increased at 242.  JAK2 V617F mutation was positive.   On 07/26/2023, she underwent bone marrow biopsy which showed hypercellular bone marrow (70%) involved by JAK2 positive myeloproliferative neoplasm.  Megakaryocytic hyperplasia and atypia and mild expansion of the erythroid lineage was noted.  Overall findings were consistent with primary myeloproliferative neoplasm (ET/PCV).  No increase in blasts or fibrosis.  Conventional cytogenetics showed normal female karyotype, 73 XX.  Previously I discussed results of the bone marrow biopsy in detail with the patient.  Explained that we are dealing with primary myeloproliferative neoplasm.   Microcytosis with small red blood cell size and slightly elevated total red blood cell count, but normal hemoglobin levels.  Since iron studies showed no evidence of iron deficiency, we proceeded with hemoglobin electrophoresis and alpha thalassemia testing.  Hemoglobin electrophoresis showed no evidence of hemoglobinopathy.  However alpha thalassemia testing did show evidence of homozygosity for -alpha 3.7 deletion, indicating alpha thalassemia trait.  Alpha thalassemia trait could be masking her polycythemia vera. Though RBC count is increased, hemoglobin is within normal limits.   R-IPSET score of 2, intermediate risk for thrombosis.  Goal is to maintain platelet count below  400,000 to prevent thrombosis related complications.    Started her on hydroxyurea  500 mg daily from 08/07/2023.  Discussed potential side effects including leg ulcers, fatigue, and nausea, though uncommon at the starting dose. Aspirin  81 mg daily is recommended to manage vasomotor symptoms and reduce clotting risk. The condition is not hereditary but can progress to leukemia if untreated. Treatment aims to prevent leukemic transformation by controlling abnormal marrow cell production.  Since she was complaining of increased fatigue, night sweats, insomnia and leg cramps, we dose reduced hydroxyurea  to 500 mg every other day starting from August 2025.  This has not really made a huge difference in her symptoms.  The possibility of night shift work contributing to symptoms was discussed. She plans to consider moving to a day shift after the first of the year, which may help alleviate symptoms.  Labs today showed continued improvement in platelet count at 312,000.  White count and hemoglobin normal.  CMP unremarkable.  She was advised to continue hydroxyurea  500 mg every other day.  - Continue baby aspirin  once daily.  - Follow up in 2 months to reassess symptoms and blood counts.

## 2024-01-22 NOTE — Assessment & Plan Note (Signed)
 Microcytosis with small red blood cell size and slightly elevated total red blood cell count, but normal hemoglobin levels.  Since iron studies showed no evidence of iron deficiency, we proceeded with hemoglobin electrophoresis and alpha thalassemia testing.  Hemoglobin electrophoresis showed no evidence of hemoglobinopathy.  However alpha thalassemia testing did show evidence of homozygosity for -alpha 3.7 deletion, indicating alpha thalassemia trait.  Alpha thalassemia trait could be masking her polycythemia vera.  Though RBC count is increased, hemoglobin is within normal limits.

## 2024-01-31 ENCOUNTER — Other Ambulatory Visit: Payer: Self-pay | Admitting: Oncology

## 2024-01-31 DIAGNOSIS — D471 Chronic myeloproliferative disease: Secondary | ICD-10-CM

## 2024-02-04 ENCOUNTER — Encounter: Payer: Self-pay | Admitting: Oncology

## 2024-04-22 ENCOUNTER — Inpatient Hospital Stay: Admitting: Oncology

## 2024-04-22 ENCOUNTER — Inpatient Hospital Stay: Attending: Oncology
